# Patient Record
Sex: Male | Born: 1935 | Race: White | Hispanic: No | Marital: Married | Smoking: Former smoker
Health system: Southern US, Community
[De-identification: ages and names within clinical notes are randomized; demographics above are authoritative.]

## PROBLEM LIST (undated history)

## (undated) DIAGNOSIS — M1990 Unspecified osteoarthritis, unspecified site: Secondary | ICD-10-CM

## (undated) DIAGNOSIS — K319 Disease of stomach and duodenum, unspecified: Secondary | ICD-10-CM

## (undated) DIAGNOSIS — N529 Male erectile dysfunction, unspecified: Secondary | ICD-10-CM

## (undated) DIAGNOSIS — K219 Gastro-esophageal reflux disease without esophagitis: Secondary | ICD-10-CM

## (undated) DIAGNOSIS — D61818 Other pancytopenia: Secondary | ICD-10-CM

## (undated) DIAGNOSIS — K222 Esophageal obstruction: Secondary | ICD-10-CM

## (undated) DIAGNOSIS — E291 Testicular hypofunction: Secondary | ICD-10-CM

## (undated) DIAGNOSIS — Z8719 Personal history of other diseases of the digestive system: Secondary | ICD-10-CM

## (undated) DIAGNOSIS — E785 Hyperlipidemia, unspecified: Secondary | ICD-10-CM

## (undated) DIAGNOSIS — I10 Essential (primary) hypertension: Secondary | ICD-10-CM

## (undated) DIAGNOSIS — K4190 Unilateral femoral hernia, without obstruction or gangrene, not specified as recurrent: Secondary | ICD-10-CM

## (undated) DIAGNOSIS — N183 Chronic kidney disease, stage 3 unspecified: Secondary | ICD-10-CM

## (undated) DIAGNOSIS — I2510 Atherosclerotic heart disease of native coronary artery without angina pectoris: Secondary | ICD-10-CM

## (undated) DIAGNOSIS — E119 Type 2 diabetes mellitus without complications: Secondary | ICD-10-CM

## (undated) DIAGNOSIS — N400 Enlarged prostate without lower urinary tract symptoms: Secondary | ICD-10-CM

## (undated) DIAGNOSIS — I219 Acute myocardial infarction, unspecified: Secondary | ICD-10-CM

## (undated) DIAGNOSIS — H8309 Labyrinthitis, unspecified ear: Secondary | ICD-10-CM

## (undated) HISTORY — PX: LIPOMA EXCISION: SHX5283

## (undated) HISTORY — PX: CARDIAC SURGERY: SHX584

## (undated) HISTORY — PX: CARDIAC CATHETERIZATION: SHX172

## (undated) HISTORY — PX: COLONOSCOPY: SHX174

## (undated) HISTORY — PX: JOINT REPLACEMENT: SHX530

## (undated) HISTORY — PX: HERNIA REPAIR: SHX51

## (undated) HISTORY — DX: Gastro-esophageal reflux disease without esophagitis: K21.9

## (undated) HISTORY — PX: TONSILLECTOMY: SUR1361

## (undated) HISTORY — PX: CORONARY ANGIOPLASTY: SHX604

## (undated) HISTORY — PX: FLEXIBLE SIGMOIDOSCOPY: SHX1649

## (undated) HISTORY — PX: APPENDECTOMY: SHX54

## (undated) SURGERY — Surgical Case
Anesthesia: *Unknown

---

## 2005-03-23 ENCOUNTER — Ambulatory Visit: Payer: Self-pay | Admitting: Internal Medicine

## 2005-04-21 ENCOUNTER — Ambulatory Visit: Payer: Self-pay | Admitting: Internal Medicine

## 2005-05-30 ENCOUNTER — Ambulatory Visit: Payer: Self-pay | Admitting: Internal Medicine

## 2005-06-21 ENCOUNTER — Ambulatory Visit: Payer: Self-pay | Admitting: Internal Medicine

## 2005-08-01 ENCOUNTER — Ambulatory Visit: Payer: Self-pay | Admitting: Internal Medicine

## 2005-08-21 ENCOUNTER — Ambulatory Visit: Payer: Self-pay | Admitting: Internal Medicine

## 2005-09-26 ENCOUNTER — Ambulatory Visit: Payer: Self-pay | Admitting: Internal Medicine

## 2005-10-21 ENCOUNTER — Ambulatory Visit: Payer: Self-pay | Admitting: Internal Medicine

## 2005-11-28 ENCOUNTER — Ambulatory Visit: Payer: Self-pay | Admitting: Internal Medicine

## 2005-12-22 ENCOUNTER — Ambulatory Visit: Payer: Self-pay | Admitting: Internal Medicine

## 2007-07-20 ENCOUNTER — Ambulatory Visit: Payer: Self-pay | Admitting: Gastroenterology

## 2007-10-28 ENCOUNTER — Ambulatory Visit: Payer: Self-pay | Admitting: Internal Medicine

## 2010-01-11 ENCOUNTER — Ambulatory Visit: Payer: Self-pay | Admitting: General Practice

## 2010-01-27 ENCOUNTER — Inpatient Hospital Stay: Payer: Self-pay | Admitting: General Practice

## 2010-02-15 ENCOUNTER — Encounter: Payer: Self-pay | Admitting: General Practice

## 2010-02-19 ENCOUNTER — Encounter: Payer: Self-pay | Admitting: General Practice

## 2011-10-28 ENCOUNTER — Ambulatory Visit: Payer: Self-pay

## 2012-03-04 ENCOUNTER — Ambulatory Visit: Payer: Self-pay | Admitting: Family Medicine

## 2012-03-04 LAB — RAPID INFLUENZA A&B ANTIGENS

## 2012-09-27 ENCOUNTER — Ambulatory Visit: Payer: Self-pay | Admitting: Gastroenterology

## 2013-02-03 ENCOUNTER — Ambulatory Visit: Payer: Self-pay | Admitting: Family Medicine

## 2013-02-03 LAB — CLOSTRIDIUM DIFFICILE BY PCR

## 2013-02-03 LAB — OCCULT BLOOD X 1 CARD TO LAB, STOOL: Occult Blood, Feces: POSITIVE

## 2014-02-07 DIAGNOSIS — N529 Male erectile dysfunction, unspecified: Secondary | ICD-10-CM | POA: Insufficient documentation

## 2014-09-11 DIAGNOSIS — D61818 Other pancytopenia: Secondary | ICD-10-CM | POA: Insufficient documentation

## 2014-09-11 DIAGNOSIS — N401 Enlarged prostate with lower urinary tract symptoms: Secondary | ICD-10-CM | POA: Insufficient documentation

## 2014-09-11 DIAGNOSIS — E291 Testicular hypofunction: Secondary | ICD-10-CM | POA: Insufficient documentation

## 2014-09-11 DIAGNOSIS — E1159 Type 2 diabetes mellitus with other circulatory complications: Secondary | ICD-10-CM | POA: Insufficient documentation

## 2014-09-12 DIAGNOSIS — Z79899 Other long term (current) drug therapy: Secondary | ICD-10-CM | POA: Insufficient documentation

## 2014-12-17 HISTORY — PX: PROSTATE SURGERY: SHX751

## 2015-02-01 DIAGNOSIS — Z96652 Presence of left artificial knee joint: Secondary | ICD-10-CM | POA: Insufficient documentation

## 2015-03-18 DIAGNOSIS — N183 Chronic kidney disease, stage 3 unspecified: Secondary | ICD-10-CM | POA: Insufficient documentation

## 2015-03-19 ENCOUNTER — Other Ambulatory Visit: Payer: Self-pay

## 2015-03-19 ENCOUNTER — Ambulatory Visit
Admit: 2015-03-19 | Disposition: A | Discharge status: Home / Self Care | Payer: Self-pay | Attending: Gastroenterology | Admitting: Gastroenterology

## 2015-04-14 LAB — SURGICAL PATHOLOGY

## 2015-05-04 ENCOUNTER — Ambulatory Visit: Payer: Medicare Other | Attending: Internal Medicine | Admitting: Physical Therapy

## 2015-05-04 ENCOUNTER — Encounter: Payer: Self-pay | Admitting: Physical Therapy

## 2015-05-04 DIAGNOSIS — M25552 Pain in left hip: Secondary | ICD-10-CM

## 2015-05-04 DIAGNOSIS — M5442 Lumbago with sciatica, left side: Secondary | ICD-10-CM

## 2015-05-04 DIAGNOSIS — M6281 Muscle weakness (generalized): Secondary | ICD-10-CM | POA: Diagnosis not present

## 2015-05-04 DIAGNOSIS — M2560 Stiffness of unspecified joint, not elsewhere classified: Secondary | ICD-10-CM | POA: Diagnosis not present

## 2015-05-04 NOTE — Therapy (Signed)
[facility] [address] [place], [place], [address] Phone: [phone]   Fax:  [phone]  Physical Therapy Evaluation  Patient Details  Name: Trevor Deleon. MRN: [id number] Date of Birth: [birth year] Referring Provider:  Ezequiel Kayser, MD  Encounter Date: 05/04/2015      PT End of Session - 05/04/15 2012    Visit Number 1   Number of Visits 8   Date for PT Re-Evaluation 06/01/15   Authorization - Visit Number 1   Authorization - Number of Visits 10   PT Start Time 1055   PT Stop Time 1147   PT Time Calculation (min) 52 min   Activity Tolerance Patient tolerated treatment well;No increased pain   Behavior During Therapy [facility] for tasks assessed/performed      Past Medical History  Diagnosis Date  . GERD (gastroesophageal reflux disease)     Past Surgical History  Procedure Laterality Date  . Joint replacement      There were no vitals filed for this visit.  Visit Diagnosis:  Pain in joint, pelvic region and thigh, left  Left-sided low back pain with left-sided sciatica  Joint stiffness of spine  Muscle weakness of lower extremity      Subjective Assessment - 05/04/15 2008    Subjective Pt. reports no back pain at this time and c/o L mid-hamstring pain with walking/ standing tasks.  Pt. states he has a L hernia and is planning on surgery in September.     Limitations Sitting;Lifting;Standing;Walking   Patient Stated Goals Decrease L LE pain.     Currently in Pain? Yes   Pain Score 7    Pain Location Leg   Pain Orientation Left;Posterior;Mid   Pain Type Chronic pain   Effect of Pain on Daily Activities limits ability to complete household chores/ yardwork.    Multiple Pain Sites No            [facility] PT Assessment - 05/04/15 0001    Assessment   Medical Diagnosis Sciatica/ L LE pain   Onset Date/Surgical Date 04/03/15   Balance Screen   Has the patient fallen in the past 6 months No   Has the  patient had a decrease in activity level because of a fear of falling?  Yes   Is the patient reluctant to leave their home because of a fear of falling?  No   [place] residence           PT Education - 05/04/15 1403    Education provided Yes   Education Details HEP/ posture correction/ proper body mechanics with yard/household chores.   Person(s) Educated Patient;Spouse   Methods Explanation;Demonstration;Verbal cues;Handout   Comprehension Verbalized understanding;Returned demonstration             PT Long Term Goals - 05/05/15 0727    PT LONG TERM GOAL #1   Title Pt. I with HEP to increase B hamstring flexibility/ core stability to WNL to improve pain-free mobility.     Time 4   Period Weeks   Status New   PT LONG TERM GOAL #2   Title Pt. will report no tenderness over L mid-hamstring to improve pain-free mobility.     Time 4   Period Weeks   Status New   PT LONG TERM GOAL #3   Title Pt. will reports <2/10 L posterior leg pain at worst with yardwork/ dailiy tasks.     Time  4   Period Weeks   Status New   PT LONG TERM GOAL #4   Title Pt. will complete LEFS and score >50 out of 80 to improve pain-free mobility.    Time 4   Period Weeks   Status New           Plan - 05-08-2015 February 23, 2012    Clinical Impression Statement Pt. is an 81 y/o male with >1 month c/o L mid-hamstring pain with standing tasks/ yardwork.  Pt. reports 7/10 L hamstring pain currently at rest and no c/o back pain.  Pt. presents with 50% limitation in standing lumbar flexion and 25% limitation in extension/rotation.  Decrease R prox./distal hamstring flexibility (54 deg./32 deg.).  L prox./distal hamstring (53 deg./28 deg.).  Significant hypomobility in thoracic spine (T5-T12) but lumbar mobility normal.  No lumbar tenderness and good pelvic alignment.  (-) SI special tests.  Palpable tenderness at L mid-hamstring as compared to R.  Unable to reproduce any radicular  symptoms with lumbar ROM/ manual tx.  Pt. will benefit from lumbar/LE stretching program with core stability to improve pain-free moblity and return to yard work/ household chores.     Pt will benefit from skilled therapeutic intervention in order to improve on the following deficits Hypomobility;Decreased activity tolerance;Decreased strength;Pain;Difficulty walking;Decreased mobility;Decreased range of motion   Rehab Potential Good   PT Frequency 2x / week   PT Duration 4 weeks   PT Treatment/Interventions ADLs/Self Care Home Management;Neuromuscular re-education;Aquatic Therapy;Passive range of motion;Patient/family education;Cryotherapy;Gait training;Manual techniques;Therapeutic exercise;Moist Heat;Traction   PT Next Visit Plan Reassess HEP/ lumbar AROM and hamstring tenderness.    PT Home Exercise Plan see handouts.    Consulted and Agree with Plan of Care Patient;Family member/caregiver   Family Member Consulted wife          G-Codes - May 08, 2015 02-22-17    Functional Assessment Tool Used clinical impression/ pain/ muscle weakness   Functional Limitation Mobility: Walking and moving around   Mobility: Walking and Moving Around Current Status [phone]) At least 20 percent but less than 40 percent impaired, limited or restricted   Mobility: Walking and Moving Around Goal Status [phone]) At least 1 percent but less than 20 percent impaired, limited or restricted       Problem List There are no active problems to display for this patient.   Pura Spice, PT, DPT # [id number]   05/05/2015, 7:29 AM   [facility] [address] [place], [place], [address] Phone: [phone]   Fax:  [phone]

## 2015-05-07 ENCOUNTER — Ambulatory Visit: Payer: Medicare Other | Admitting: Physical Therapy

## 2015-05-07 DIAGNOSIS — M2560 Stiffness of unspecified joint, not elsewhere classified: Secondary | ICD-10-CM

## 2015-05-07 DIAGNOSIS — M6281 Muscle weakness (generalized): Secondary | ICD-10-CM

## 2015-05-07 DIAGNOSIS — M25552 Pain in left hip: Secondary | ICD-10-CM

## 2015-05-07 DIAGNOSIS — M5442 Lumbago with sciatica, left side: Secondary | ICD-10-CM | POA: Diagnosis not present

## 2015-05-08 NOTE — Therapy (Signed)
[facility] [address] [place], [place], [address] Phone: [phone]   Fax:  [phone]  Physical Therapy Treatment  Patient Details  Name: Trevor Deleon. MRN: [id number] Date of Birth: 06-24-1932 Referring Provider:  Ezequiel Kayser, MD  Encounter Date: 05/07/2015      PT End of Session - 05/08/15 0825    Visit Number 2   Number of Visits 8   Date for PT Re-Evaluation 06/01/15   Authorization - Visit Number 2   Authorization - Number of Visits 10   PT Start Time 1113   PT Stop Time 1158   PT Time Calculation (min) 45 min   Activity Tolerance Patient tolerated treatment well;No increased pain   Behavior During Therapy [facility] for tasks assessed/performed      Past Medical History  Diagnosis Date  . GERD (gastroesophageal reflux disease)     Past Surgical History  Procedure Laterality Date  . Joint replacement      There were no vitals filed for this visit.  Visit Diagnosis:  Pain in joint, pelvic region and thigh, left  Left-sided low back pain with left-sided sciatica  Joint stiffness of spine  Muscle weakness of lower extremity      Subjective Assessment - 05/08/15 0822    Subjective Pt. reports he is doing much better since PT initial evaluation.  Pt. reports no pain in back and minimal L mid-hamstring discomfort.    Limitations Sitting;Lifting;Standing;Walking   Patient Stated Goals Decrease L LE pain.     Currently in Pain? Yes   Pain Score 2    Pain Location Leg   Pain Orientation Left;Mid;Proximal       OBJECTIVE:  There.ex.: reviewed HEP/ hamstring stretches and modified technique.  Manual tx.: Supine hamstring/ piriformis stretches 3x with holds.  Deep STM to L prox. To distal hamstring (no tenderness noted).  Prone mid-low thoracic PA grade II-III mobs. 2x20 sec. Each.  STM to lumbar region (no radicular symptoms).     Pt response for medical necessity:  No increase c/o pain or tenderness  noted today.          PT Long Term Goals - 05/05/15 0727    PT LONG TERM GOAL #1   Title Pt. I with HEP to increase B hamstring flexibility/ core stability to WNL to improve pain-free mobility.     Time 4   Period Weeks   Status New   PT LONG TERM GOAL #2   Title Pt. will report no tenderness over L mid-hamstring to improve pain-free mobility.     Time 4   Period Weeks   Status New   PT LONG TERM GOAL #3   Title Pt. will reports <2/10 L posterior leg pain at worst with yardwork/ dailiy tasks.     Time 4   Period Weeks   Status New   PT LONG TERM GOAL #4   Title Pt. will complete LEFS and score >50 out of 80 to improve pain-free mobility.    Time 4   Period Weeks   Status New               Plan - 05/08/15 0826    Clinical Impression Statement Decrease L mid-hamstring tenderness with deep palpation.  Pt. reported no pain in back/hamstring muscle after tx. session.  Pt. reports good compliance with HEP and is sore but not hurting.     Pt will benefit from skilled therapeutic intervention in order  to improve on the following deficits Hypomobility;Decreased activity tolerance;Decreased strength;Pain;Difficulty walking;Decreased mobility;Decreased range of motion   Rehab Potential Good   PT Frequency 2x / week   PT Duration 4 weeks   PT Treatment/Interventions ADLs/Self Care Home Management;Neuromuscular re-education;Aquatic Therapy;Passive range of motion;Patient/family education;Cryotherapy;Gait training;Manual techniques;Therapeutic exercise;Moist Heat;Traction   PT Next Visit Plan Progress HEP/ add strengthening if no hamstring tenderness.    PT Home Exercise Plan see handouts.    Consulted and Agree with Plan of Care Patient;Family member/caregiver   Family Member Consulted wife        Problem List There are no active problems to display for this patient.   Pura Spice, PT, DPT # [id number]   05/08/2015, 8:31 AM  [facility]  [facility] [place] [address] [place], [place], [address] Phone: [phone]   Fax:  [phone]

## 2015-05-12 ENCOUNTER — Ambulatory Visit: Payer: Medicare Other | Admitting: Physical Therapy

## 2015-05-12 DIAGNOSIS — M5442 Lumbago with sciatica, left side: Secondary | ICD-10-CM

## 2015-05-12 DIAGNOSIS — M6281 Muscle weakness (generalized): Secondary | ICD-10-CM

## 2015-05-12 DIAGNOSIS — M25552 Pain in left hip: Secondary | ICD-10-CM

## 2015-05-12 DIAGNOSIS — M2560 Stiffness of unspecified joint, not elsewhere classified: Secondary | ICD-10-CM

## 2015-05-12 NOTE — Therapy (Signed)
[facility] [address] [place], [place], [address] Phone: [phone]   Fax:  [phone]  Physical Therapy Treatment  Patient Details  Name: Trevor Deleon. MRN: [id number] Date of Birth: 03-18-1932 Referring Provider:  Ezequiel Kayser, MD  Encounter Date: 05/12/2015      PT End of Session - 05/12/15 1332    Visit Number 3   Number of Visits 8   Date for PT Re-Evaluation 06/01/15   Authorization - Visit Number 3   Authorization - Number of Visits 10   PT Start Time 9735   PT Stop Time 1200   PT Time Calculation (min) 48 min   Activity Tolerance Patient tolerated treatment well;No increased pain   Behavior During Therapy [facility] for tasks assessed/performed      Past Medical History  Diagnosis Date  . GERD (gastroesophageal reflux disease)     Past Surgical History  Procedure Laterality Date  . Joint replacement      There were no vitals filed for this visit.  Visit Diagnosis:  Pain in joint, pelvic region and thigh, left  Left-sided low back pain with left-sided sciatica  Joint stiffness of spine  Muscle weakness of lower extremity      Subjective Assessment - 05/12/15 1112    Subjective (p) Pt. reports no pain in back/L hamstring at this time.  Pt. states he was hurting this morning but did exercises/ stretches and currently has no pain.     Limitations (p) Sitting;Lifting;Standing;Walking   Patient Stated Goals (p) Decrease L LE pain.     Currently in Pain? (p) No/denies        OBJECTIVE: There.ex.: Supine active hamstring stretches.  Scifit L6 10 min. B UE/LE (no pain/ no charge warm-up).  Supine quad/ hamstring sets 10x 10 sec. Holds.  Manual tx.: Supine hamstring/ piriformis stretches 3x with holds. Deep STM to L prox. To distal hamstring (no tenderness noted). Prone mid-low thoracic PA grade II-III mobs. 2x20 sec. Each. STM to lumbar region (no radicular symptoms).    Pt response for  medical necessity: No increase c/o pain or tenderness noted today.           PT Education - 05/12/15 1332    Education provided Yes   Education Details HEP (issued bridging/ core ex. in supine position)- pg. #2   Person(s) Educated Patient   Methods Explanation;Demonstration;Handout   Comprehension Verbalized understanding;Returned demonstration;Verbal cues required             PT Long Term Goals - 05/05/15 0727    PT LONG TERM GOAL #1   Title Pt. I with HEP to increase B hamstring flexibility/ core stability to WNL to improve pain-free mobility.     Time 4   Period Weeks   Status New   PT LONG TERM GOAL #2   Title Pt. will report no tenderness over L mid-hamstring to improve pain-free mobility.     Time 4   Period Weeks   Status New   PT LONG TERM GOAL #3   Title Pt. will reports <2/10 L posterior leg pain at worst with yardwork/ dailiy tasks.     Time 4   Period Weeks   Status New   PT LONG TERM GOAL #4   Title Pt. will complete LEFS and score >50 out of 80 to improve pain-free mobility.    Time 4   Period Weeks   Status New  Plan - 05/12/15 1333    Clinical Impression Statement No pain with palpation to lumbar/L mid-hamstring region.  Improvement noted in hamstring/piriformis flexibility and no increase c/o pain with addition of resisted hamstirng/core strengthening ex.     Pt will benefit from skilled therapeutic intervention in order to improve on the following deficits Hypomobility;Decreased activity tolerance;Decreased strength;Pain;Difficulty walking;Decreased mobility;Decreased range of motion   Rehab Potential Good   PT Frequency 2x / week   PT Duration 4 weeks   PT Treatment/Interventions ADLs/Self Care Home Management;Neuromuscular re-education;Aquatic Therapy;Passive range of motion;Patient/family education;Cryotherapy;Gait training;Manual techniques;Therapeutic exercise;Moist Heat;Traction   PT Next Visit Plan Reeassess HEP/ check  goals   PT Home Exercise Plan see handouts.    Consulted and Agree with Plan of Care Patient        Problem List There are no active problems to display for this patient.   Pura Spice, PT, DPT # [phone]   05/12/2015, 1:42 PM  [facility] [address] [place], [place], [address] Phone: [phone]   Fax:  [phone]

## 2015-05-14 ENCOUNTER — Ambulatory Visit: Payer: Medicare Other | Admitting: Physical Therapy

## 2015-05-14 DIAGNOSIS — M25552 Pain in left hip: Secondary | ICD-10-CM

## 2015-05-14 DIAGNOSIS — M5442 Lumbago with sciatica, left side: Secondary | ICD-10-CM | POA: Diagnosis not present

## 2015-05-14 DIAGNOSIS — M6281 Muscle weakness (generalized): Secondary | ICD-10-CM

## 2015-05-14 DIAGNOSIS — M2560 Stiffness of unspecified joint, not elsewhere classified: Secondary | ICD-10-CM

## 2015-05-15 NOTE — Therapy (Signed)
[facility] [address] [place], [place], [address] Phone: [phone]   Fax:  [phone]  Physical Therapy Treatment  Patient Details  Name: Trevor Deleon. MRN: [id number] Date of Birth: [birth year] Referring Provider:  Ezequiel Kayser, MD  Encounter Date: 05/14/2015      PT End of Session - 05/15/15 0852    Visit Number 4   Number of Visits 8   Date for PT Re-Evaluation 06/01/15   Authorization - Visit Number 4   Authorization - Number of Visits 10   PT Start Time 1113   PT Stop Time 1155   PT Time Calculation (min) 42 min   Activity Tolerance Patient tolerated treatment well;No increased pain   Behavior During Therapy [facility] for tasks assessed/performed      Past Medical History  Diagnosis Date  . GERD (gastroesophageal reflux disease)     Past Surgical History  Procedure Laterality Date  . Joint replacement      There were no vitals filed for this visit.  Visit Diagnosis:  Pain in joint, pelvic region and thigh, left  Left-sided low back pain with left-sided sciatica  Joint stiffness of spine  Muscle weakness of lower extremity      Subjective Assessment - 05/15/15 0847    Subjective Pt. sore/hurting in L mid-hamstring this morning but doing better at this time.  Pt. states he is stiff in the morning. Pt. states exercises/ stretching helps in the morning.    Limitations Sitting;Lifting;Standing;Walking   Patient Stated Goals Decrease L LE pain.     Currently in Pain? No/denies         OBJECTIVE: There.ex.: Supine active hamstring stretches. Scifit L6 10 min. B UE/LE (no pain/ no charge warm-up). Seated GTB knee flexion on edge of mat table 20x (moderate resistance).  Prone L knee flexion with manual resistance 10x (no pain).  Manual tx.: Supine hamstring/ piriformis stretches 3x with holds. Deep STM to L prox. To distal hamstring (no tenderness noted). Prone mid-low thoracic PA grade II-III mobs.  2x20 sec. Each. STM to lumbar region (no radicular symptoms).    Pt response for medical necessity: No increase c/o pain or tenderness noted today.          PT Long Term Goals - 05/05/15 0727    PT LONG TERM GOAL #1   Title Pt. I with HEP to increase B hamstring flexibility/ core stability to WNL to improve pain-free mobility.     Time 4   Period Weeks   Status New   PT LONG TERM GOAL #2   Title Pt. will report no tenderness over L mid-hamstring to improve pain-free mobility.     Time 4   Period Weeks   Status New   PT LONG TERM GOAL #3   Title Pt. will reports <2/10 L posterior leg pain at worst with yardwork/ dailiy tasks.     Time 4   Period Weeks   Status New   PT LONG TERM GOAL #4   Title Pt. will complete LEFS and score >50 out of 80 to improve pain-free mobility.    Time 4   Period Weeks   Status New               Plan - 05/15/15 6962    Clinical Impression Statement Moderate thoracic hypomobility and no tenderness with palpation along hamstring muscle fibers.  No pain with moderate resistance L hamstring ex.     Pt  will benefit from skilled therapeutic intervention in order to improve on the following deficits Hypomobility;Decreased activity tolerance;Decreased strength;Pain;Difficulty walking;Decreased mobility;Decreased range of motion   Rehab Potential Good   PT Frequency 2x / week   PT Duration 4 weeks   PT Treatment/Interventions ADLs/Self Care Home Management;Neuromuscular re-education;Aquatic Therapy;Passive range of motion;Patient/family education;Cryotherapy;Gait training;Manual techniques;Therapeutic exercise;Moist Heat;Traction   PT Next Visit Plan Reeassess HEP/ check goals   PT Home Exercise Plan see handouts.         Problem List There are no active problems to display for this patient.   Pura Spice, PT, DPT # [phone]   05/15/2015, 9:05 AM  [facility] [address] [place], [place], [address] Phone: [phone]   Fax:  [phone]

## 2015-05-19 ENCOUNTER — Ambulatory Visit: Payer: Medicare Other | Admitting: Physical Therapy

## 2015-05-19 DIAGNOSIS — M25552 Pain in left hip: Secondary | ICD-10-CM

## 2015-05-19 DIAGNOSIS — M5442 Lumbago with sciatica, left side: Secondary | ICD-10-CM

## 2015-05-19 DIAGNOSIS — M2560 Stiffness of unspecified joint, not elsewhere classified: Secondary | ICD-10-CM

## 2015-05-19 DIAGNOSIS — M6281 Muscle weakness (generalized): Secondary | ICD-10-CM

## 2015-05-19 NOTE — Therapy (Signed)
[facility] [address] [place], [place], [address] Phone: [phone]   Fax:  [phone]  Physical Therapy Treatment  Patient Details  Name: Trevor Deleon. MRN: [id number] Date of Birth: 1932/08/10 Referring Provider:  Ezequiel Kayser, MD  Encounter Date: 05/19/2015      PT End of Session - 05/20/15 0804    Visit Number 5   Number of Visits 8   Date for PT Re-Evaluation 06/01/15   Authorization - Visit Number 5   Authorization - Number of Visits 10   PT Start Time 1113   PT Stop Time 1200   PT Time Calculation (min) 47 min   Activity Tolerance Patient tolerated treatment well;No increased pain   Behavior During Therapy [facility] for tasks assessed/performed      Past Medical History  Diagnosis Date  . GERD (gastroesophageal reflux disease)     Past Surgical History  Procedure Laterality Date  . Joint replacement      There were no vitals filed for this visit.  Visit Diagnosis:  Left-sided low back pain with left-sided sciatica  Pain in joint, pelvic region and thigh, left  Joint stiffness of spine  Muscle weakness of lower extremity      Subjective Assessment - 05/20/15 0803    Subjective No pain or issues in hamstring reported at this time.  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  Pt. will occasionally have tightness in L hamstring but reports doing better overall.     Limitations Sitting;Lifting;Standing;Walking   Patient Stated Goals Decrease L LE pain.     Currently in Pain? No/denies       OBJECTIVE: There.ex.: Scifit L7 10 min. B UE/LE (no pain/ no charge/ warm-up). Seated L/R knee flexion isometrics (max resistance) on edge of mat table 10x with 10 sec holds (no pain). Prone L knee flexion with manual resistance 5x (no pain).  Reviewed current HEP/ addition of TrA ex.  Manual tx.: Supine hamstring/ piriformis stretches 3x with holds. Deep STM to L prox.  To distal hamstring (no tenderness noted). Prone mid-low thoracic PA grade II-III mobs. 2x20 sec. Each. STM to lumbar region (no radicular symptoms).    Pt response for medical necessity: No increase c/o pain or tenderness noted today.         PT Long Term Goals - 05/05/15 0727    PT LONG TERM GOAL #1   Title Pt. I with HEP to increase B hamstring flexibility/ core stability to WNL to improve pain-free mobility.     Time 4   Period Weeks   Status New   PT LONG TERM GOAL #2   Title Pt. will report no tenderness over L mid-hamstring to improve pain-free mobility.     Time 4   Period Weeks   Status New   PT LONG TERM GOAL #3   Title Pt. will reports <2/10 L posterior leg pain at worst with yardwork/ dailiy tasks.     Time 4   Period Weeks   Status New   PT LONG TERM GOAL #4   Title Pt. will complete LEFS and score >50 out of 80 to improve pain-free mobility.    Time 4   Period Weeks   Status New               Plan - 05/20/15 8938    Clinical Impression Statement No tenderness in L hamstring today  during STM and increase flexibility noted during LE/manual stretches.  Pt. presents with good quad/hip/hamstring strengthening during max. resistance isometrics.  Decrease PT tx. frequency to 1x/week and reassess goals next week    Pt will benefit from skilled therapeutic intervention in order to improve on the following deficits Hypomobility;Decreased activity tolerance;Decreased strength;Pain;Difficulty walking;Decreased mobility;Decreased range of motion   Rehab Potential Good   PT Frequency 2x / week   PT Duration 4 weeks   PT Treatment/Interventions ADLs/Self Care Home Management;Neuromuscular re-education;Aquatic Therapy;Passive range of motion;Patient/family education;Cryotherapy;Gait training;Manual techniques;Therapeutic exercise;Moist Heat;Traction   PT Next Visit Plan Reeassess HEP/ check goals   PT Home Exercise Plan see handouts.    Consulted and  Agree with Plan of Care Patient        Problem List There are no active problems to display for this patient.   Pura Spice, PT, DPT # [id number]   05/20/2015, 8:07 AM  [facility] [address] [place], [place], [address] Phone: [phone]   Fax:  [phone]

## 2015-05-21 ENCOUNTER — Encounter: Payer: Medicare Other | Admitting: Physical Therapy

## 2015-05-26 ENCOUNTER — Ambulatory Visit: Payer: Medicare Other | Attending: Internal Medicine | Admitting: Physical Therapy

## 2015-05-26 DIAGNOSIS — M2560 Stiffness of unspecified joint, not elsewhere classified: Secondary | ICD-10-CM | POA: Diagnosis present

## 2015-05-26 DIAGNOSIS — M5442 Lumbago with sciatica, left side: Secondary | ICD-10-CM | POA: Diagnosis present

## 2015-05-26 DIAGNOSIS — M25552 Pain in left hip: Secondary | ICD-10-CM

## 2015-05-26 DIAGNOSIS — M6281 Muscle weakness (generalized): Secondary | ICD-10-CM | POA: Diagnosis present

## 2015-05-27 NOTE — Therapy (Signed)
[facility] [address] [facility] [address] [place], [place], [address] Phone: [phone]   Fax:  [phone]  Physical Therapy Treatment  Patient Details  Name: Trevor Deleon. MRN: [id number] Date of Birth: September 12, 1932 Referring Provider:  Ezequiel Kayser, MD  Encounter Date: 05/26/2015      PT End of Session - 05/27/15 1059    Visit Number 6   Number of Visits 8   Date for PT Re-Evaluation 06/01/15   Authorization - Visit Number 6   Authorization - Number of Visits 10   PT Start Time 1110   PT Stop Time 1158   PT Time Calculation (min) 48 min   Activity Tolerance Patient tolerated treatment well;No increased pain   Behavior During Therapy [facility] for tasks assessed/performed      Past Medical History  Diagnosis Date  . GERD (gastroesophageal reflux disease)     Past Surgical History  Procedure Laterality Date  . Joint replacement      There were no vitals filed for this visit.  Visit Diagnosis:  Left-sided low back pain with left-sided sciatica  Pain in joint, pelvic region and thigh, left  Joint stiffness of spine  Muscle weakness of lower extremity      Subjective Assessment - 05/27/15 1057    Subjective Pt. states he has continued to do well with no c/o pain or L hamstring discomfort.  Pt. does report L hamstring tightness in morning but exercises have helped improve pain during the day.  Pt. remains active cleaning out shed and walking on a regular basis.  Pt. reports he is much better since starting PT.   Limitations Sitting;Lifting;Standing;Walking   Patient Stated Goals Decrease L LE pain.     Currently in Pain? No/denies        OBJECTIVE: There.ex.: Scifit L7 10 min. B UE/LE (no pain/ no charge/ warm-up). Seated L/R knee flexion isometrics (max resistance) on edge of mat table 10x with 10 sec holds (no pain). Prone L knee flexion with manual resistance 5x (no pain). Reviewed current HEP/ addition of TrA ex.  Manual  tx.: Supine hamstring/ piriformis stretches 3x with holds. Deep STM to L prox. To distal hamstring (no tenderness noted). Prone mid-low thoracic PA grade II-III mobs. 2x20 sec. Each. STM to lumbar region (no radicular symptoms).    Pt response for medical necessity: No increase c/o pain or tenderness noted today.  Discharge at this time.            PT Education - 05/27/15 1059    Education provided Yes   Education Details Reviewed HEP/ stretches.  Discussed importance of body mechanics/ walking program.     Person(s) Educated Patient   Methods Explanation;Demonstration   Comprehension Verbalized understanding;Returned demonstration;Verbal cues required             PT Long Term Goals - 05/27/15 1101    PT LONG TERM GOAL #1   Title Pt. I with HEP to increase B hamstring flexibility/ core stability to WNL to improve pain-free mobility.     Time 4   Period Weeks   Status Achieved   PT LONG TERM GOAL #2   Title Pt. will report no tenderness over L mid-hamstring to improve pain-free mobility.     Time 4   Period Weeks   Status Achieved   PT LONG TERM GOAL #3   Title Pt. will reports <2/10 L posterior leg pain at worst with yardwork/ dailiy tasks.  Time 4   Period Weeks   Status Achieved   PT LONG TERM GOAL #4   Title Pt. will complete LEFS and score >50 out of 80 to improve pain-free mobility.    Time 4   Period Weeks   Status Achieved               Plan - 2015-06-12 1059    Clinical Impression Statement Pt. has progressed well towards all PT goals.  No tenderness or pain in L hamstring at this time.  Unable to reproduce any pain symptoms during functional tasks.  Discharge from PT at this time with focus on independent ex. program.  Pt. instructed to call PT if any issues retrun.     Pt will benefit from skilled therapeutic intervention in order to improve on the following deficits Hypomobility;Decreased activity tolerance;Decreased  strength;Pain;Difficulty walking;Decreased mobility;Decreased range of motion   Rehab Potential Good   PT Frequency 2x / week   PT Duration 4 weeks   PT Treatment/Interventions ADLs/Self Care Home Management;Neuromuscular re-education;Aquatic Therapy;Passive range of motion;Patient/family education;Cryotherapy;Gait training;Manual techniques;Therapeutic exercise;Moist Heat;Traction   PT Next Visit Plan Discharge at this time.     PT Home Exercise Plan see handouts.    Consulted and Agree with Plan of Care Patient          G-Codes - 2015/06/12 1102    Functional Assessment Tool Used clinical impression/ pain/ muscle weakness   Functional Limitation Mobility: Walking and moving around   Mobility: Walking and Moving Around Current Status [phone]) At least 1 percent but less than 20 percent impaired, limited or restricted   Mobility: Walking and Moving Around Goal Status [phone]) At least 1 percent but less than 20 percent impaired, limited or restricted   Mobility: Walking and Moving Around Discharge Status [phone]) At least 1 percent but less than 20 percent impaired, limited or restricted      Problem List There are no active problems to display for this patient.   Pura Spice, PT, DPT # [phone]   Jun 12, 2015, 11:03 AM  [facility] [address] [place], [place], [address] Phone: [phone]   Fax:  [phone]

## 2015-08-02 ENCOUNTER — Ambulatory Visit
Admission: EM | Admit: 2015-08-02 | Discharge: 2015-08-02 | Disposition: A | Discharge status: Home / Self Care | Payer: Medicare Other | Attending: Family Medicine | Admitting: Family Medicine

## 2015-08-02 ENCOUNTER — Encounter: Payer: Self-pay | Admitting: *Deleted

## 2015-08-02 ENCOUNTER — Other Ambulatory Visit: Payer: Self-pay

## 2015-08-02 DIAGNOSIS — I252 Old myocardial infarction: Secondary | ICD-10-CM | POA: Diagnosis not present

## 2015-08-02 DIAGNOSIS — K2100 Gastro-esophageal reflux disease with esophagitis, without bleeding: Secondary | ICD-10-CM

## 2015-08-02 DIAGNOSIS — I10 Essential (primary) hypertension: Secondary | ICD-10-CM | POA: Diagnosis not present

## 2015-08-02 DIAGNOSIS — Z8679 Personal history of other diseases of the circulatory system: Secondary | ICD-10-CM

## 2015-08-02 DIAGNOSIS — R1013 Epigastric pain: Secondary | ICD-10-CM | POA: Diagnosis present

## 2015-08-02 HISTORY — DX: Essential (primary) hypertension: I10

## 2015-08-02 HISTORY — DX: Unilateral femoral hernia, without obstruction or gangrene, not specified as recurrent: K41.90

## 2015-08-02 MED ORDER — GI COCKTAIL ~~LOC~~
30.0000 mL | Freq: Once | ORAL | Status: AC
  Administered 2015-08-02: 30 mL via ORAL

## 2015-08-02 NOTE — ED Notes (Signed)
Pt states he had indigestion yesterday, woke up this morning with localized pain.  Pt has had an endoscopy in April for this issue, but the pain today is lasting longer than it has in the past.

## 2015-08-02 NOTE — ED Provider Notes (Signed)
CSN: [id number]     Arrival date & time 08/02/15  1224 History   First MD Initiated Contact with Patient 08/02/15 1252     Chief Complaint  Patient presents with  . Abdominal Pain   patient states he has a history of reflux and that he'll wake up with discomfort up into his neck. This morning he woke with epigastric pain has really gotten much better intensity has improved though somewhat scarred from a 6-2. States the difference though is that normally when he has his epigastric pain and swelling for about 2 hours this time/over 4 hours. He does have history of heart disease and had catheterization stent as having MI in 1999. States his cardiologist is wants him to have his stress test before he has hernia surgery and he hasn't had done yet. (Consider location/radiation/quality/duration/timing/severity/associated sxs/prior Treatment) Patient is a 81 y.o. male presenting with abdominal pain. The history is provided by the spouse and the patient.  Abdominal Pain Pain location:  Epigastric Pain quality: cramping and sharp   Pain radiates to:  Does not radiate Pain severity:  Moderate Onset quality:  Sudden Timing:  Constant Progression:  Improving Chronicity:  Recurrent Context: not awakening from sleep, not diet changes, not medication withdrawal, not sick contacts and not suspicious food intake   Relieved by:  Nothing Ineffective treatments:  OTC medications Associated symptoms: no anorexia, no chest pain, no cough, no nausea and no shortness of breath   Risk factors: aspirin, being elderly and multiple surgeries     Past Medical History  Diagnosis Date  . GERD (gastroesophageal reflux disease)   . Hypertension   . Hernia, femoral    Past Surgical History  Procedure Laterality Date  . Joint replacement    . Cardiac surgery     No family history on file. Social History  Substance Use Topics  . Smoking status: Former Research scientist (life sciences)  . Smokeless tobacco: None  . Alcohol Use: No     Review of Systems  Respiratory: Negative for cough and shortness of breath.   Cardiovascular: Negative for chest pain.  Gastrointestinal: Positive for abdominal pain. Negative for nausea and anorexia.  All other systems reviewed and are negative.  nurse's note reviewed patient stopped smoking in 1960s  Allergies  Review of patient's allergies indicates no known allergies.  Home Medications   Prior to Admission medications   Medication Sig Start Date End Date Taking? Authorizing Provider  Acetaminophen (TYLENOL) 325 MG CAPS Take by mouth.   Yes Historical Provider, MD  doxazosin (CARDURA) 4 MG tablet Take 4 mg by mouth daily.   Yes Historical Provider, MD  Fish Oil-Cholecalciferol (FISH OIL + D3 PO) Take by mouth.   Yes Historical Provider, MD  Multiple Vitamin (MULTIVITAMIN) capsule Take 1 capsule by mouth daily.   Yes Historical Provider, MD  omeprazole (PRILOSEC) 20 MG capsule Take 20 mg by mouth daily.   Yes Historical Provider, MD  Polyethylene Glycol 3350 (MIRALAX PO) Take by mouth.   Yes Historical Provider, MD  Psyllium (METAMUCIL PO) Take by mouth.   Yes Historical Provider, MD  ranitidine (ZANTAC) 150 MG tablet Take 150 mg by mouth 2 (two) times daily.   Yes Historical Provider, MD  simvastatin (ZOCOR) 40 MG tablet Take 40 mg by mouth daily.   Yes Historical Provider, MD  triamterene-hydrochlorothiazide (MAXZIDE-25) 37.5-25 MG per tablet Take 1 tablet by mouth daily.   Yes Historical Provider, MD   Meds Ordered and Administered this Visit   Medications  gi cocktail (Maalox,Lidocaine,Donnatal) (30 mLs Oral Given 08/02/15 1310)    BP 125/57 mmHg  Pulse 70  Temp(Src) 98.1 F (36.7 C) (Oral)  Ht 5\' 11"  (1.803 m)  Wt 182 lb (82.555 kg)  BMI 25.40 kg/m2  SpO2 100% No data found.   Physical Exam  Constitutional: He is oriented to person, place, and time. He appears well-developed.  Elderly white male  HENT:  Head: Normocephalic.  Eyes: Conjunctivae are normal.   Neck: Neck supple.  Cardiovascular: Normal rate and regular rhythm.   No murmur heard. Pulmonary/Chest: Effort normal and breath sounds normal. No respiratory distress.  Abdominal: Soft. Bowel sounds are normal. He exhibits no distension.  Musculoskeletal: Normal range of motion.  Neurological: He is alert and oriented to person, place, and time. No cranial nerve deficit.  Skin: Skin is warm and dry. No erythema.  Psychiatric: He has a normal mood and affect.  Vitals reviewed.   ED Course  Procedures (including critical care time)  Labs Review Labs Reviewed - No data to display  Imaging Review No results found.   Visual Acuity Review  Right Eye Distance:   Left Eye Distance:   Bilateral Distance:    Right Eye Near:   Left Eye Near:    Bilateral Near:      EKG was obtained showed sinus rhythm with some PACs   MDM   1. Reflux esophagitis   2. History of coronary artery disease     After GI cocktail was ministered patient felt much better states the discomfort has gone from 6 to about a 3 when he first got here to basically 01 at this time. Explained patient that if you want to really evaluate his heart and we we can do that is with serial cardiac enzymes and we did send over to the emergency room. He does not want to do that. He states that he sure size heart and that he feels so much better that he wants to go home. Explained to him if he starts having chest discomfort abdominal discomfort with epigastric discomfort returns he needs to go to the ED of his choice otherwise follow-up with his PCP.  Patient was explained once again that this evaluation does not with his heart and that we'll contact me for the blood work or tests since she's sees a gastroenterologist and had endoscopy recently for his reflexes esophagitis   Frederich Cha, MD 08/02/15 1419

## 2015-08-02 NOTE — Discharge Instructions (Signed)
Food Choices for Gastroesophageal Reflux Disease When you have gastroesophageal reflux disease (GERD), the foods you eat and your eating habits are very important. Choosing the right foods can help ease your discomfort.  WHAT GUIDELINES DO I NEED TO FOLLOW?   Choose fruits, vegetables, whole grains, and low-fat dairy products.   Choose low-fat meat, fish, and poultry.  Limit fats such as oils, salad dressings, butter, nuts, and avocado.   Keep a food diary. This helps you identify foods that cause symptoms.   Avoid foods that cause symptoms. These may be different for everyone.   Eat small meals often instead of 3 large meals a day.   Eat your meals slowly, in a place where you are relaxed.   Limit fried foods.   Cook foods using methods other than frying.   Avoid drinking alcohol.   Avoid drinking large amounts of liquids with your meals.   Avoid bending over or lying down until 2-3 hours after eating.  WHAT FOODS ARE NOT RECOMMENDED?  These are some foods and drinks that may make your symptoms worse: Vegetables Tomatoes. Tomato juice. Tomato and spaghetti sauce. Chili peppers. Onion and garlic. Horseradish. Fruits Oranges, grapefruit, and lemon (fruit and juice). Meats High-fat meats, fish, and poultry. This includes hot dogs, ribs, ham, sausage, salami, and bacon. Dairy Whole milk and chocolate milk. Sour cream. Cream. Butter. Ice cream. Cream cheese.  Drinks Coffee and tea. Bubbly (carbonated) drinks or energy drinks. Condiments Hot sauce. Barbecue sauce.  Sweets/Desserts Chocolate and cocoa. Donuts. Peppermint and spearmint. Fats and Oils High-fat foods. This includes [place] fries and potato chips. Other Vinegar. Strong spices. This includes black pepper, white pepper, red pepper, cayenne, curry powder, cloves, ginger, and chili powder. The items listed above may not be a complete list of foods and drinks to avoid. Contact your dietitian for more  information. Document Released: 05/08/2012 Document Revised: 11/12/2013 Document Reviewed: 09/11/2013 [facility] Patient Information 2015 [place], [place]. This information is not intended to replace advice given to you by your health care provider. Make sure you discuss any questions you have with your health care provider.  Heartburn Heartburn is a painful, burning feeling in the chest. It may feel worse when you lie down or bend over. Heartburn is caused by stomach acid moving into the tube that carries food from the mouth to the stomach (esophagus). HOME CARE  Take all medicine as told by your doctor.  Raise the head of your bed with blocks only as told by your doctor.  Do not exercise right after eating.  Avoid eating 2 or 3 hours before bed. Do not lie down right after eating.  Eat small meals throughout the day instead of 3 large meals.  Stop smoking if you smoke.  Keep up a healthy weight.  Avoid foods that give you heartburn. Foods you may want to avoid include:  Peppers.  Chocolate.  High-fat foods, including fried foods.  Spicy foods.  Garlic and onions.  Citrus fruits, including oranges, grapefruit, lemons, and limes.  Food containing tomatoes or tomato products.  Mint.  Bubbly (carbonated) drinks and drinks with caffeine.  Vinegar. GET HELP RIGHT AWAY IF:  You have bad chest pain that goes down your arm or into your jaw or neck.  You feel sweaty, dizzy, or lightheaded.  You have trouble breathing.  You throw up (vomit) blood.  You have trouble or pain when swallowing.  You have bloody or black poop (stool).  You have heartburn more than 3  times a week, for more than 2 weeks. MAKE SURE YOU:  Understand these instructions.  Will watch your condition.  Will get help right away if you are not doing well or get worse. Document Released: 07/20/2011 Document Revised: 01/30/2012 Document Reviewed: 07/20/2011 [facility] Patient Information 2015  [place], [place]. This information is not intended to replace advice given to you by your health care provider. Make sure you discuss any questions you have with your health care provider.

## 2015-08-04 ENCOUNTER — Encounter: Payer: Self-pay | Admitting: Emergency Medicine

## 2015-08-04 ENCOUNTER — Emergency Department
Admission: EM | Admit: 2015-08-04 | Discharge: 2015-08-04 | Disposition: A | Discharge status: Home / Self Care | Payer: Medicare Other | Attending: Emergency Medicine | Admitting: Emergency Medicine

## 2015-08-04 ENCOUNTER — Emergency Department: Payer: Medicare Other

## 2015-08-04 DIAGNOSIS — I10 Essential (primary) hypertension: Secondary | ICD-10-CM | POA: Diagnosis not present

## 2015-08-04 DIAGNOSIS — Z87891 Personal history of nicotine dependence: Secondary | ICD-10-CM | POA: Diagnosis not present

## 2015-08-04 DIAGNOSIS — K8020 Calculus of gallbladder without cholecystitis without obstruction: Secondary | ICD-10-CM | POA: Diagnosis not present

## 2015-08-04 DIAGNOSIS — Z79899 Other long term (current) drug therapy: Secondary | ICD-10-CM | POA: Insufficient documentation

## 2015-08-04 DIAGNOSIS — R52 Pain, unspecified: Secondary | ICD-10-CM | POA: Diagnosis present

## 2015-08-04 DIAGNOSIS — R17 Unspecified jaundice: Secondary | ICD-10-CM

## 2015-08-04 LAB — URINALYSIS COMPLETE WITH MICROSCOPIC (ARMC ONLY)
BILIRUBIN URINE: NEGATIVE
Bacteria, UA: NONE SEEN
GLUCOSE, UA: NEGATIVE mg/dL
KETONES UR: NEGATIVE mg/dL
Leukocytes, UA: NEGATIVE
NITRITE: NEGATIVE
PH: 5 (ref 5.0–8.0)
Protein, ur: 30 mg/dL — AB
Specific Gravity, Urine: 1.024 (ref 1.005–1.030)

## 2015-08-04 LAB — CBC
HCT: 41.6 % (ref 40.0–52.0)
Hemoglobin: 14.3 g/dL (ref 13.0–18.0)
MCH: 29.8 pg (ref 26.0–34.0)
MCHC: 34.4 g/dL (ref 32.0–36.0)
MCV: 86.9 fL (ref 80.0–100.0)
PLATELETS: 89 10*3/uL — AB (ref 150–440)
RBC: 4.79 MIL/uL (ref 4.40–5.90)
RDW: 13.5 % (ref 11.5–14.5)
WBC: 7.4 10*3/uL (ref 3.8–10.6)

## 2015-08-04 LAB — COMPREHENSIVE METABOLIC PANEL
ALT: 107 U/L — ABNORMAL HIGH (ref 17–63)
AST: 72 U/L — ABNORMAL HIGH (ref 15–41)
Albumin: 3.8 g/dL (ref 3.5–5.0)
Alkaline Phosphatase: 80 U/L (ref 38–126)
Anion gap: 13 (ref 5–15)
BUN: 36 mg/dL — ABNORMAL HIGH (ref 6–20)
CHLORIDE: 98 mmol/L — AB (ref 101–111)
CO2: 24 mmol/L (ref 22–32)
Calcium: 9 mg/dL (ref 8.9–10.3)
Creatinine, Ser: 1.65 mg/dL — ABNORMAL HIGH (ref 0.61–1.24)
GFR, EST AFRICAN AMERICAN: 43 mL/min — AB (ref >60)
GFR, EST NON AFRICAN AMERICAN: 37 mL/min — AB (ref >60)
Glucose, Bld: 152 mg/dL — ABNORMAL HIGH (ref 65–99)
POTASSIUM: 3.2 mmol/L — AB (ref 3.5–5.1)
Sodium: 135 mmol/L (ref 135–145)
Total Bilirubin: 5 mg/dL — ABNORMAL HIGH (ref 0.3–1.2)
Total Protein: 7 g/dL (ref 6.5–8.1)

## 2015-08-04 LAB — PROTIME-INR
INR: 1.1
Prothrombin Time: 14.4 seconds (ref 11.4–15.0)

## 2015-08-04 LAB — BILIRUBIN, DIRECT: Bilirubin, Direct: 1.9 mg/dL — ABNORMAL HIGH (ref 0.1–0.5)

## 2015-08-04 MED ORDER — SODIUM CHLORIDE 0.9 % IV SOLN
1000.0000 mL | Freq: Once | INTRAVENOUS | Status: AC
  Administered 2015-08-04: 1000 mL via INTRAVENOUS
  Filled 2015-08-04: qty 1000

## 2015-08-04 NOTE — ED Notes (Signed)
Pt rto ed via ems from home with c/o body aches, and ? Fever this am.  Pt states he had body aches and was shaking this morning so he took 2 tylenol and then had sweating. Pt alert and oriented at this time, skin hot to touch, no fever at this time.  Pt placed on cm and blood drawn and sent.

## 2015-08-04 NOTE — ED Provider Notes (Signed)
[facility] Emergency Department Provider Note  ____________________________________________  Time seen: On arrival  I have reviewed the triage vital signs and the nursing notes.   HISTORY  Chief Complaint Fever and Generalized Body Aches  HPI Trevor Deleon. is a 81 y.o. male who presents with complaints of myalgias, malaise and fatigue that started yesterday.2 days ago he went to urgent care to be evaluated for upper epigastric pain which resolved after a GI cocktail. The following day he had chills and what he describes as rigors. He did not check his temperature but is concerned he had a fever. No nausea/vomiting. No cough. No shortness of breath     Past Medical History  Diagnosis Date  . GERD (gastroesophageal reflux disease)   . Hypertension   . Hernia, femoral     There are no active problems to display for this patient.   Past Surgical History  Procedure Laterality Date  . Joint replacement    . Cardiac surgery      Current Outpatient Rx  Name  Route  Sig  Dispense  Refill  . ALPRAZolam (XANAX) 0.25 MG tablet   Oral   Take 1 tablet by mouth at bedtime as needed.      3   . doxazosin (CARDURA) 4 MG tablet   Oral   Take 4 mg by mouth daily.         . Fish Oil-Cholecalciferol (FISH OIL + D3 PO)   Oral   Take by mouth.         Marland Kitchen lisinopril (PRINIVIL,ZESTRIL) 40 MG tablet   Oral   Take 40 mg by mouth daily.         . Multiple Vitamin (MULTIVITAMIN) capsule   Oral   Take 1 capsule by mouth daily.         Marland Kitchen omeprazole (PRILOSEC) 20 MG capsule   Oral   Take 20 mg by mouth daily.         . Polyethylene Glycol 3350 (MIRALAX PO)   Oral   Take 1 Dose by mouth daily.          . Psyllium (METAMUCIL PO)   Oral   Take 1 Dose by mouth daily.          . ranitidine (ZANTAC) 150 MG tablet   Oral   Take 150 mg by mouth 2 (two) times daily.         . simvastatin (ZOCOR) 40 MG tablet   Oral   Take 40 mg by  mouth daily.         Marland Kitchen triamterene-hydrochlorothiazide (MAXZIDE-25) 37.5-25 MG per tablet   Oral   Take 1 tablet by mouth daily.         . Acetaminophen (TYLENOL) 325 MG CAPS   Oral   Take by mouth.           Allergies Review of patient's allergies indicates no known allergies.  History reviewed. No pertinent family history.  Social History Social History  Substance Use Topics  . Smoking status: Former Research scientist (life sciences)  . Smokeless tobacco: None  . Alcohol Use: No    Review of Systems  Constitutional: Positive for fever and chills Eyes: Negative for visual changes. ENT: Negative for sore throat Cardiovascular: Negative for chest pain. Respiratory: Negative for shortness of breath. Gastrointestinal: Negative for abdominal pain, vomiting and diarrhea. Genitourinary: Negative for dysuria. Musculoskeletal: Negative for back pain. Skin: Negative for rash. Neurological: Negative for headaches or focal weakness Psychiatric:  No anxiety    ____________________________________________   PHYSICAL EXAM:  VITAL SIGNS: ED Triage Vitals  Enc Vitals Group     BP 08/04/15 0830 140/78 mmHg     Pulse Rate 08/04/15 0830 72     Resp 08/04/15 0830 20     Temp 08/04/15 0830 98.3 F (36.8 C)     Temp Source 08/04/15 0830 Oral     SpO2 08/04/15 0830 97 %     Weight 08/04/15 0830 180 lb (81.647 kg)     Height 08/04/15 0830 5\' 11"  (1.803 m)     Head Cir --      Peak Flow --      Pain Score 08/04/15 0831 0     Pain Loc --      Pain Edu? --      Excl. in [place]? --      Constitutional: Alert and oriented. Well appearing and in no distress. Eyes: Conjunctivae are normal.  ENT   Head: Normocephalic and atraumatic.   Mouth/Throat: Mucous membranes are moist. Cardiovascular: Normal rate, regular rhythm. Normal and symmetric distal pulses are present in all extremities. No murmurs, rubs, or gallops. Respiratory: Normal respiratory effort without tachypnea nor retractions. Breath  sounds are clear and equal bilaterally.  Gastrointestinal: Soft and non-tender in all quadrants. No distention. There is no CVA tenderness. Genitourinary: deferred Musculoskeletal: Nontender with normal range of motion in all extremities. No lower extremity tenderness nor edema. Neurologic:  Normal speech and language. No gross focal neurologic deficits are appreciated. Skin:  Skin is warm, dry and intact. No rash noted. Psychiatric: Mood and affect are normal. Patient exhibits appropriate insight and judgment.  ____________________________________________    LABS (pertinent positives/negatives)  Labs Reviewed  CBC - Abnormal; Notable for the following:    Platelets 89 (*)    All other components within normal limits  COMPREHENSIVE METABOLIC PANEL - Abnormal; Notable for the following:    Potassium 3.2 (*)    Chloride 98 (*)    Glucose, Bld 152 (*)    BUN 36 (*)    Creatinine, Ser 1.65 (*)    AST 72 (*)    ALT 107 (*)    Total Bilirubin 5.0 (*)    GFR calc non Af Amer 37 (*)    GFR calc Af Amer 43 (*)    All other components within normal limits  URINALYSIS COMPLETEWITH MICROSCOPIC ([facility] ONLY) - Abnormal; Notable for the following:    Color, Urine AMBER (*)    APPearance CLEAR (*)    Hgb urine dipstick 1+ (*)    Protein, ur 30 (*)    Squamous Epithelial / LPF 0-5 (*)    All other components within normal limits  BILIRUBIN, DIRECT - Abnormal; Notable for the following:    Bilirubin, Direct 1.9 (*)    All other components within normal limits  PROTIME-INR    ____________________________________________   EKG  None  ____________________________________________    RADIOLOGY I have personally reviewed any xrays that were ordered on this patient: Ultrasound right upper quadrant unremarkable  ____________________________________________   PROCEDURES  Procedure(s) performed: none  Critical Care performed:  none  ____________________________________________   INITIAL IMPRESSION / ASSESSMENT AND PLAN / ED COURSE  Pertinent labs & imaging results that were available during my care of the patient were reviewed by me and considered in my medical decision making (see chart for details).  Patient with elevated T bili, mildly elevated LFTs, normal ultrasound. He does not drink he does  not take excess Tylenol . Will discuss with GI  ----------------------------------------- 2:01 PM on 08/04/2015 -----------------------------------------  Discussed lab results and imaging results and patient's history of present illness with Dr. Rayann Heman, he suspects the patient passed a bile duct stone. He recommends repeat liver enzymes in 24 hours.   I discussed with patient's primary care provider Dr. Dorthula Perfect who will see the patient in one day and repeat the patient's blood tests. I had extensive discussions with the patient regarding return precautions. The patient and family agree with the plan  ____________________________________________   FINAL CLINICAL IMPRESSION(S) / ED DIAGNOSES  Final diagnoses:  Total bilirubin, elevated     Lavonia Drafts, MD 08/04/15 1440

## 2015-08-04 NOTE — Discharge Instructions (Signed)

## 2015-08-29 ENCOUNTER — Emergency Department: Payer: Medicare Other

## 2015-08-29 ENCOUNTER — Inpatient Hospital Stay
Admission: EM | Admit: 2015-08-29 | Discharge: 2015-09-04 | DRG: 417 | Disposition: A | Discharge status: Home / Self Care | Payer: Medicare Other | Attending: Internal Medicine | Admitting: Internal Medicine

## 2015-08-29 ENCOUNTER — Encounter: Payer: Self-pay | Admitting: Emergency Medicine

## 2015-08-29 DIAGNOSIS — F419 Anxiety disorder, unspecified: Secondary | ICD-10-CM | POA: Diagnosis present

## 2015-08-29 DIAGNOSIS — D696 Thrombocytopenia, unspecified: Secondary | ICD-10-CM | POA: Diagnosis present

## 2015-08-29 DIAGNOSIS — N400 Enlarged prostate without lower urinary tract symptoms: Secondary | ICD-10-CM | POA: Diagnosis present

## 2015-08-29 DIAGNOSIS — Z6825 Body mass index (BMI) 25.0-25.9, adult: Secondary | ICD-10-CM | POA: Diagnosis not present

## 2015-08-29 DIAGNOSIS — Z955 Presence of coronary angioplasty implant and graft: Secondary | ICD-10-CM | POA: Diagnosis not present

## 2015-08-29 DIAGNOSIS — K219 Gastro-esophageal reflux disease without esophagitis: Secondary | ICD-10-CM | POA: Diagnosis present

## 2015-08-29 DIAGNOSIS — K8020 Calculus of gallbladder without cholecystitis without obstruction: Secondary | ICD-10-CM

## 2015-08-29 DIAGNOSIS — R7989 Other specified abnormal findings of blood chemistry: Secondary | ICD-10-CM | POA: Diagnosis present

## 2015-08-29 DIAGNOSIS — I25119 Atherosclerotic heart disease of native coronary artery with unspecified angina pectoris: Secondary | ICD-10-CM | POA: Diagnosis present

## 2015-08-29 DIAGNOSIS — I252 Old myocardial infarction: Secondary | ICD-10-CM | POA: Diagnosis not present

## 2015-08-29 DIAGNOSIS — E876 Hypokalemia: Secondary | ICD-10-CM | POA: Diagnosis present

## 2015-08-29 DIAGNOSIS — E785 Hyperlipidemia, unspecified: Secondary | ICD-10-CM | POA: Diagnosis present

## 2015-08-29 DIAGNOSIS — E440 Moderate protein-calorie malnutrition: Secondary | ICD-10-CM | POA: Diagnosis present

## 2015-08-29 DIAGNOSIS — Z79899 Other long term (current) drug therapy: Secondary | ICD-10-CM | POA: Diagnosis not present

## 2015-08-29 DIAGNOSIS — Z7982 Long term (current) use of aspirin: Secondary | ICD-10-CM

## 2015-08-29 DIAGNOSIS — Z87891 Personal history of nicotine dependence: Secondary | ICD-10-CM

## 2015-08-29 DIAGNOSIS — K851 Biliary acute pancreatitis without necrosis or infection: Secondary | ICD-10-CM | POA: Diagnosis present

## 2015-08-29 DIAGNOSIS — R002 Palpitations: Secondary | ICD-10-CM | POA: Diagnosis not present

## 2015-08-29 DIAGNOSIS — E871 Hypo-osmolality and hyponatremia: Secondary | ICD-10-CM | POA: Diagnosis present

## 2015-08-29 DIAGNOSIS — E1122 Type 2 diabetes mellitus with diabetic chronic kidney disease: Secondary | ICD-10-CM | POA: Diagnosis present

## 2015-08-29 DIAGNOSIS — N183 Chronic kidney disease, stage 3 (moderate): Secondary | ICD-10-CM | POA: Diagnosis present

## 2015-08-29 DIAGNOSIS — K8064 Calculus of gallbladder and bile duct with chronic cholecystitis without obstruction: Principal | ICD-10-CM | POA: Diagnosis present

## 2015-08-29 DIAGNOSIS — K859 Acute pancreatitis without necrosis or infection, unspecified: Secondary | ICD-10-CM | POA: Diagnosis present

## 2015-08-29 DIAGNOSIS — E860 Dehydration: Secondary | ICD-10-CM | POA: Diagnosis present

## 2015-08-29 DIAGNOSIS — I129 Hypertensive chronic kidney disease with stage 1 through stage 4 chronic kidney disease, or unspecified chronic kidney disease: Secondary | ICD-10-CM | POA: Diagnosis present

## 2015-08-29 HISTORY — DX: Hyperlipidemia, unspecified: E78.5

## 2015-08-29 HISTORY — DX: Chronic kidney disease, stage 3 (moderate): N18.3

## 2015-08-29 HISTORY — DX: Chronic kidney disease, stage 3 unspecified: N18.30

## 2015-08-29 HISTORY — DX: Benign prostatic hyperplasia without lower urinary tract symptoms: N40.0

## 2015-08-29 LAB — CBC WITH DIFFERENTIAL/PLATELET
BASOS ABS: 0 10*3/uL (ref 0–0.1)
EOS ABS: 0.1 10*3/uL (ref 0–0.7)
HEMATOCRIT: 41.1 % (ref 40.0–52.0)
Hemoglobin: 14.4 g/dL (ref 13.0–18.0)
Lymphocytes Relative: 21 %
Lymphs Abs: 1.4 10*3/uL (ref 1.0–3.6)
MCH: 30.4 pg (ref 26.0–34.0)
MCHC: 35 g/dL (ref 32.0–36.0)
MCV: 86.9 fL (ref 80.0–100.0)
MONO ABS: 0.5 10*3/uL (ref 0.2–1.0)
Monocytes Relative: 7 %
NEUTROS ABS: 4.6 10*3/uL (ref 1.4–6.5)
Neutrophils Relative %: 69 %
PLATELETS: 90 10*3/uL — AB (ref 150–440)
RBC: 4.73 MIL/uL (ref 4.40–5.90)
RDW: 13.8 % (ref 11.5–14.5)
WBC: 6.7 10*3/uL (ref 3.8–10.6)

## 2015-08-29 LAB — COMPREHENSIVE METABOLIC PANEL
ALT: 68 U/L — ABNORMAL HIGH (ref 17–63)
AST: 149 U/L — ABNORMAL HIGH (ref 15–41)
Albumin: 4.5 g/dL (ref 3.5–5.0)
Alkaline Phosphatase: 93 U/L (ref 38–126)
Anion gap: 9 (ref 5–15)
BUN: 30 mg/dL — ABNORMAL HIGH (ref 6–20)
CO2: 29 mmol/L (ref 22–32)
Calcium: 9.4 mg/dL (ref 8.9–10.3)
Chloride: 101 mmol/L (ref 101–111)
Creatinine, Ser: 1.37 mg/dL — ABNORMAL HIGH (ref 0.61–1.24)
GFR calc Af Amer: 54 mL/min — ABNORMAL LOW (ref >60)
GFR calc non Af Amer: 47 mL/min — ABNORMAL LOW (ref >60)
Glucose, Bld: 175 mg/dL — ABNORMAL HIGH (ref 65–99)
Potassium: 3.4 mmol/L — ABNORMAL LOW (ref 3.5–5.1)
Sodium: 139 mmol/L (ref 135–145)
Total Bilirubin: 2.1 mg/dL — ABNORMAL HIGH (ref 0.3–1.2)
Total Protein: 7.2 g/dL (ref 6.5–8.1)

## 2015-08-29 LAB — TROPONIN I: Troponin I: <0.03 ng/mL (ref <0.031)

## 2015-08-29 LAB — LIPASE, BLOOD: Lipase: 4978 U/L — ABNORMAL HIGH (ref 22–51)

## 2015-08-29 MED ORDER — ACETAMINOPHEN 650 MG RE SUPP: 650.0000 mg | Freq: Four times a day (QID) | RECTAL | Status: DC | PRN

## 2015-08-29 MED ORDER — PANTOPRAZOLE SODIUM 40 MG PO TBEC
40.0000 mg | DELAYED_RELEASE_TABLET | Freq: Every day | ORAL | Status: DC
  Administered 2015-08-29 – 2015-09-04 (×6): 40 mg via ORAL
  Filled 2015-08-29 (×6): qty 1

## 2015-08-29 MED ORDER — DOXAZOSIN MESYLATE 4 MG PO TABS
4.0000 mg | ORAL_TABLET | Freq: Every day | ORAL | Status: DC
  Administered 2015-08-29 – 2015-09-03 (×6): 4 mg via ORAL
  Filled 2015-08-29 (×6): qty 1

## 2015-08-29 MED ORDER — ACETAMINOPHEN 325 MG PO TABS: 650.0000 mg | ORAL_TABLET | Freq: Four times a day (QID) | ORAL | Status: DC | PRN

## 2015-08-29 MED ORDER — FAMOTIDINE 20 MG PO TABS
20.0000 mg | ORAL_TABLET | Freq: Two times a day (BID) | ORAL | Status: DC
  Administered 2015-08-29 – 2015-09-04 (×12): 20 mg via ORAL
  Filled 2015-08-29 (×12): qty 1

## 2015-08-29 MED ORDER — ALPRAZOLAM 0.25 MG PO TABS
0.2500 mg | ORAL_TABLET | Freq: Every evening | ORAL | Status: DC | PRN
  Administered 2015-09-01: 0.25 mg via ORAL
  Filled 2015-08-29: qty 1

## 2015-08-29 MED ORDER — POTASSIUM CHLORIDE ER 10 MEQ PO TBCR
30.0000 meq | EXTENDED_RELEASE_TABLET | Freq: Every day | ORAL | Status: DC
  Administered 2015-08-29 – 2015-09-04 (×6): 30 meq via ORAL
  Filled 2015-08-29 (×13): qty 3

## 2015-08-29 MED ORDER — ENOXAPARIN SODIUM 40 MG/0.4ML ~~LOC~~ SOLN
40.0000 mg | SUBCUTANEOUS | Status: DC
  Administered 2015-08-29 – 2015-08-31 (×3): 40 mg via SUBCUTANEOUS
  Filled 2015-08-29 (×3): qty 0.4

## 2015-08-29 MED ORDER — SODIUM CHLORIDE 0.9 % IV BOLUS (SEPSIS)
1000.0000 mL | Freq: Once | INTRAVENOUS | Status: AC
  Administered 2015-08-29: 1000 mL via INTRAVENOUS

## 2015-08-29 MED ORDER — HYDROCODONE-ACETAMINOPHEN 5-325 MG PO TABS
1.0000 | ORAL_TABLET | ORAL | Status: DC | PRN
  Administered 2015-09-02 – 2015-09-04 (×6): 2 via ORAL
  Filled 2015-08-29 (×5): qty 2

## 2015-08-29 MED ORDER — ASPIRIN EC 81 MG PO TBEC
81.0000 mg | DELAYED_RELEASE_TABLET | Freq: Every day | ORAL | Status: DC
  Filled 2015-08-29 (×3): qty 1

## 2015-08-29 MED ORDER — GI COCKTAIL ~~LOC~~
30.0000 mL | Freq: Once | ORAL | Status: AC
  Administered 2015-08-29: 30 mL via ORAL
  Filled 2015-08-29: qty 30

## 2015-08-29 MED ORDER — MORPHINE SULFATE (PF) 4 MG/ML IV SOLN
4.0000 mg | INTRAVENOUS | Status: DC | PRN
  Administered 2015-09-02 – 2015-09-03 (×4): 4 mg via INTRAVENOUS
  Filled 2015-08-29 (×4): qty 1

## 2015-08-29 MED ORDER — ONDANSETRON HCL 4 MG/2ML IJ SOLN
4.0000 mg | Freq: Four times a day (QID) | INTRAMUSCULAR | Status: DC | PRN
  Administered 2015-09-02: 4 mg via INTRAVENOUS

## 2015-08-29 MED ORDER — SIMVASTATIN 40 MG PO TABS
40.0000 mg | ORAL_TABLET | Freq: Every day | ORAL | Status: DC
  Administered 2015-08-29 – 2015-09-03 (×6): 40 mg via ORAL
  Filled 2015-08-29 (×6): qty 1

## 2015-08-29 MED ORDER — ONDANSETRON HCL 4 MG PO TABS: 4.0000 mg | ORAL_TABLET | Freq: Four times a day (QID) | ORAL | Status: DC | PRN

## 2015-08-29 MED ORDER — ALBUTEROL SULFATE (2.5 MG/3ML) 0.083% IN NEBU: 2.5000 mg | INHALATION_SOLUTION | RESPIRATORY_TRACT | Status: DC | PRN

## 2015-08-29 MED ORDER — POTASSIUM CHLORIDE IN NACL 20-0.9 MEQ/L-% IV SOLN
INTRAVENOUS | Status: DC
  Administered 2015-08-29 – 2015-08-30 (×2): 1000 mL via INTRAVENOUS
  Administered 2015-08-30 – 2015-08-31 (×2): via INTRAVENOUS
  Filled 2015-08-29 (×6): qty 1000

## 2015-08-29 NOTE — ED Notes (Signed)
Admitting MD in room at this time 

## 2015-08-29 NOTE — H&P (Signed)
[facility] [place] NAME: Trevor Deleon    MR#:  [id number]  DATE OF BIRTH:  Apr 08, 1932  DATE OF ADMISSION:  08/29/2015  PRIMARY CARE PHYSICIAN: Ezequiel Kayser, MD   REQUESTING/REFERRING PHYSICIAN: Orbie Pyo, MD  CHIEF COMPLAINT:   Chief Complaint  Patient presents with  . Chest Pain    Pt. presents to ed with family for chest pain that started around 10pm last night.   Epigastric pain since last night. HISTORY OF PRESENT ILLNESS:  Trevor Deleon  is a 81 y.o. male with a known history of gallbladder stone, CAD, CKD, hypertension and hyperlipidemia. The patient started to have abdominal pain in epigastric area since last night, which is intermittent, pressure-like, 5 out of 10 without radiation, not associated with the nausea, vomiting or diarrhea. He denies any fever or chills, no melena or bloody stool, not jaundice. The patient has a history of gallbladder stone, had abdominal pain with elevated bilirubin for the past few months. He was scheduled to have a gallbladder surgery in 2 weeks. His lipase is elevated at 4978. Dr. Tollie Pizza, general surgeon, suggested admitting patient for medical treatment, then may need surgery early this coming week.  PAST MEDICAL HISTORY:   Past Medical History  Diagnosis Date  . GERD (gastroesophageal reflux disease)   . Hypertension   . Hernia, femoral   . CKD (chronic kidney disease) stage 3, GFR 30-59 ml/min   . BPH (benign prostatic hyperplasia)   . Hyperlipidemia     PAST SURGICAL HISTORY:   Past Surgical History  Procedure Laterality Date  . Joint replacement    . Cardiac surgery    . Tonsillectomy    . Appendectomy      SOCIAL HISTORY:   Social History  Substance Use Topics  . Smoking status: Former Research scientist (life sciences)  . Smokeless tobacco: Not on file  . Alcohol Use: No    FAMILY HISTORY:   Family History  Problem Relation Age of Onset  . Hypertension Mother   . Stroke  Mother   . Prostate cancer Father   . Heart attack Father     DRUG ALLERGIES:  No Known Allergies  REVIEW OF SYSTEMS:  CONSTITUTIONAL: No fever, fatigue or weakness.  EYES: No blurred or double vision.  EARS, NOSE, AND THROAT: No tinnitus or ear pain.  RESPIRATORY: No cough, shortness of breath, wheezing or hemoptysis.  CARDIOVASCULAR: No chest pain, orthopnea, edema.  GASTROINTESTINAL: No nausea, vomiting, diarrhea but has abdominal pain.  GENITOURINARY: No dysuria, hematuria.  ENDOCRINE: No polyuria, nocturia,  HEMATOLOGY: No anemia, easy bruising or bleeding SKIN: No rash or lesion. MUSCULOSKELETAL: No joint pain or arthritis.   NEUROLOGIC: No tingling, numbness, weakness.  PSYCHIATRY: No anxiety or depression.   MEDICATIONS AT HOME:   Prior to Admission medications   Medication Sig Start Date End Date Taking? Authorizing Provider  ALPRAZolam Duanne Moron) 0.25 MG tablet Take 1 tablet by mouth at bedtime as needed. 06/25/15   Historical Provider, MD  doxazosin (CARDURA) 4 MG tablet Take 4 mg by mouth daily.    Historical Provider, MD  lisinopril (PRINIVIL,ZESTRIL) 40 MG tablet Take 40 mg by mouth daily.    Historical Provider, MD  Multiple Vitamin (MULTIVITAMIN) capsule Take 1 capsule by mouth daily.    Historical Provider, MD  omeprazole (PRILOSEC) 20 MG capsule Take 20 mg by mouth daily.    Historical Provider, MD  Polyethylene Glycol 3350 (MIRALAX PO) Take 1 Dose by mouth daily.  Historical Provider, MD  Psyllium (METAMUCIL PO) Take 1 Dose by mouth daily.     Historical Provider, MD  ranitidine (ZANTAC) 150 MG tablet Take 150 mg by mouth 2 (two) times daily.    Historical Provider, MD  simvastatin (ZOCOR) 40 MG tablet Take 40 mg by mouth daily.    Historical Provider, MD  triamterene-hydrochlorothiazide (MAXZIDE-25) 37.5-25 MG per tablet Take 1 tablet by mouth daily.    Historical Provider, MD      VITAL SIGNS:  Blood pressure 113/68, pulse 60, temperature 97.5 F (36.4 C),  temperature source Oral, resp. rate 15, height 5\' 11"  (1.803 m), weight 78.926 kg (174 lb), SpO2 97 %.  PHYSICAL EXAMINATION:  GENERAL:  81 y.o.-year-old patient lying in the bed with no acute distress.  EYES: Pupils equal, round, reactive to light and accommodation. No scleral icterus. Extraocular muscles intact.  HEENT: Head atraumatic, normocephalic. Oropharynx and nasopharynx clear. Moist oral mucosa. NECK:  Supple, no jugular venous distention. No thyroid enlargement, no tenderness.  LUNGS: Normal breath sounds bilaterally, no wheezing, rales,rhonchi or crepitation. No use of accessory muscles of respiration.  CARDIOVASCULAR: S1, S2 normal. No murmurs, rubs, or gallops.  ABDOMEN: Soft, mild tenderness in epigastric area, nondistended. Bowel sounds present. No organomegaly or mass.  EXTREMITIES: No pedal edema, cyanosis, or clubbing.  NEUROLOGIC: Cranial nerves II through XII are intact. Muscle strength 5/5 in all extremities. Sensation intact. Gait not checked.  PSYCHIATRIC: The patient is alert and oriented x 3.  SKIN: No obvious rash, lesion, or ulcer. No jaundice.  LABORATORY PANEL:   CBC  Recent Labs Lab 08/29/15 0549  WBC 6.7  HGB 14.4  HCT 41.1  PLT 90*   ------------------------------------------------------------------------------------------------------------------  Chemistries   Recent Labs Lab 08/29/15 0549  NA 139  K 3.4*  CL 101  CO2 29  GLUCOSE 175*  BUN 30*  CREATININE 1.37*  CALCIUM 9.4  AST 149*  ALT 68*  ALKPHOS 93  BILITOT 2.1*   ------------------------------------------------------------------------------------------------------------------  Cardiac Enzymes  Recent Labs Lab 08/29/15 0549  TROPONINI <0.03   ------------------------------------------------------------------------------------------------------------------  RADIOLOGY:  Dg Chest 2 View  08/29/2015   CLINICAL DATA:  Epigastric pain beginning yesterday evening. History of  gallstones and occurred, hypertension.  EXAM: CHEST  2 VIEW  COMPARISON:  Chest radiograph August 04, 2015  FINDINGS: Cardiac silhouette is normal. Tortuous calcified aorta. No pleural effusion or focal consolidation. No pneumothorax. Calcifications in RIGHT neck are likely vascular. Mild chronic appearing T11 and T12 compression fracture with focal kyphosis. Osteopenia.  IMPRESSION: No acute cardiopulmonary process.   Electronically Signed   By: Elon Alas M.D.   On: 08/29/2015 06:50    EKG:   Orders placed or performed during the hospital encounter of 08/29/15  . EKG 12-Lead  . EKG 12-Lead    IMPRESSION AND PLAN:   Acute pancreatitis Cholelithiasis Abnormal liver function test Hypokalemia Thrombocytopenia CAD CKD stage 3 HTN HLP  NPO except medication and sips of water, IV fluid support, pain control, zofran prn. Follow up surgeon, Dr. Tollie Pizza. Give potassium supplement and follow-up magnesium level. Continue hypertension medication, aspirin and statin. But hold aspirin before surgery.   All the records are reviewed and case discussed with ED provider and Dr. Majel Homer. Management plans discussed with the patient, family and they are in agreement.  CODE STATUS: Full code  TOTAL TIME TAKING CARE OF THIS PATIENT: 56 minutes.    Demetrios Loll M.D on 08/29/2015 at 8:38 AM  Between 7am to 6pm - Pager -  [phone]  After 6pm go to [email or web address] - password [email or web address] [facility]  Office  [phone]  CC: Primary care physician; Ezequiel Kayser, MD

## 2015-08-29 NOTE — ED Provider Notes (Signed)
[facility] Emergency Department Provider Note  ____________________________________________  Time seen: Approximately 630 AM  I have reviewed the triage vital signs and the nursing notes.   HISTORY  Chief Complaint Chest Pain    HPI Trevor Deleon. is a 81 y.o. male with a history of cholelithiasis who is presenting today with epigastric pain. He says it started about 10 PM last night and feels like a pressure. He had no associated nausea, vomiting, shortness of breath or chest pain. He says that he has had GERD in the past and this feels similar. He does take antacids at home and is compliant with his medications. He does not drink. He said that the pain started when he had gotten up from his desk. He did not eat any of his trigger foods yesterday for his GERD which include sweet food and fried things.The pain is nonradiating. He says it has been constant since 10 PM at a 5 out of 10.   Past Medical History  Diagnosis Date  . GERD (gastroesophageal reflux disease)   . Hypertension   . Hernia, femoral     There are no active problems to display for this patient.   Past Surgical History  Procedure Laterality Date  . Joint replacement    . Cardiac surgery      Current Outpatient Rx  Name  Route  Sig  Dispense  Refill  . Acetaminophen (TYLENOL) 325 MG CAPS   Oral   Take by mouth.         . ALPRAZolam (XANAX) 0.25 MG tablet   Oral   Take 1 tablet by mouth at bedtime as needed.      3   . doxazosin (CARDURA) 4 MG tablet   Oral   Take 4 mg by mouth daily.         . Fish Oil-Cholecalciferol (FISH OIL + D3 PO)   Oral   Take by mouth.         Marland Kitchen lisinopril (PRINIVIL,ZESTRIL) 40 MG tablet   Oral   Take 40 mg by mouth daily.         . Multiple Vitamin (MULTIVITAMIN) capsule   Oral   Take 1 capsule by mouth daily.         Marland Kitchen omeprazole (PRILOSEC) 20 MG capsule   Oral   Take 20 mg by mouth daily.         . Polyethylene  Glycol 3350 (MIRALAX PO)   Oral   Take 1 Dose by mouth daily.          . Psyllium (METAMUCIL PO)   Oral   Take 1 Dose by mouth daily.          . ranitidine (ZANTAC) 150 MG tablet   Oral   Take 150 mg by mouth 2 (two) times daily.         . simvastatin (ZOCOR) 40 MG tablet   Oral   Take 40 mg by mouth daily.         Marland Kitchen triamterene-hydrochlorothiazide (MAXZIDE-25) 37.5-25 MG per tablet   Oral   Take 1 tablet by mouth daily.           Allergies Review of patient's allergies indicates no known allergies.  History reviewed. No pertinent family history.  Social History Social History  Substance Use Topics  . Smoking status: Former Research scientist (life sciences)  . Smokeless tobacco: None  . Alcohol Use: No    Review of Systems Constitutional: No fever/chills Eyes:  No visual changes. ENT: No sore throat. Cardiovascular: Denies chest pain. Respiratory: Denies shortness of breath. Gastrointestinal:  No nausea, no vomiting.  No diarrhea.  No constipation. Genitourinary: Negative for dysuria. Musculoskeletal: Negative for back pain. Skin: Negative for rash. Neurological: Negative for headaches, focal weakness or numbness.  10-point ROS otherwise negative.  ____________________________________________   PHYSICAL EXAM:  VITAL SIGNS: ED Triage Vitals  Enc Vitals Group     BP 08/29/15 0511 113/68 mmHg     Pulse Rate 08/29/15 0511 86     Resp --      Temp 08/29/15 0511 97.5 F (36.4 C)     Temp Source 08/29/15 0511 Oral     SpO2 08/29/15 0511 97 %     Weight 08/29/15 0511 174 lb (78.926 kg)     Height 08/29/15 0511 5\' 11"  (1.803 m)     Head Cir --      Peak Flow --      Pain Score 08/29/15 0532 5     Pain Loc --      Pain Edu? --      Excl. in [place]? --     Constitutional: Alert and oriented. Well appearing and in no acute distress. Eyes: Conjunctivae are normal. PERRL. EOMI. Head: Atraumatic. Nose: No congestion/rhinnorhea. Mouth/Throat: Mucous membranes are moist.   Oropharynx non-erythematous. Neck: No stridor.   Cardiovascular: Normal rate, regular rhythm. Grossly normal heart sounds.  Good peripheral circulation. Respiratory: Normal respiratory effort.  No retractions. Lungs CTAB. Gastrointestinal: Soft with mild tenderness to the epigastrium. There is a negative Murphy sign. No distention. No abdominal bruits. No CVA tenderness. Musculoskeletal: No lower extremity tenderness nor edema.  No joint effusions. Neurologic:  Normal speech and language. No gross focal neurologic deficits are appreciated. No gait instability. Skin:  Skin is warm, dry and intact. No rash noted. Psychiatric: Mood and affect are normal. Speech and behavior are normal.  ____________________________________________   LABS (all labs ordered are listed, but only abnormal results are displayed)  Labs Reviewed  CBC WITH DIFFERENTIAL/PLATELET - Abnormal; Notable for the following:    Platelets 90 (*)    All other components within normal limits  COMPREHENSIVE METABOLIC PANEL - Abnormal; Notable for the following:    Potassium 3.4 (*)    Glucose, Bld 175 (*)    BUN 30 (*)    Creatinine, Ser 1.37 (*)    AST 149 (*)    ALT 68 (*)    Total Bilirubin 2.1 (*)    GFR calc non Af Amer 47 (*)    GFR calc Af Amer 54 (*)    All other components within normal limits  LIPASE, BLOOD - Abnormal; Notable for the following:    Lipase 4978 (*)    All other components within normal limits  TROPONIN I   ____________________________________________  EKG  ED ECG REPORT I, Doran Stabler, the attending physician, personally viewed and interpreted this ECG.   Date: 08/29/2015  EKG Time: 519  Rate: 82  Rhythm: normal EKG, normal sinus rhythm  Axis: Normal axis  Intervals:none  ST&T Change: No ST segment elevation or depression. No abnormal T-wave inversion.  ____________________________________________  RADIOLOGY  Chest x-ray without any acute disease. I personally reviewed  this film. ____________________________________________   PROCEDURES    ____________________________________________   INITIAL IMPRESSION / ASSESSMENT AND PLAN / ED COURSE  Pertinent labs & imaging results that were available during my care of the patient were reviewed by me and considered in  my medical decision making (see chart for details).  ----------------------------------------- 7:52 AM on 08/29/2015 -----------------------------------------  The patient is scheduled to have his gallbladder out on the 25th by Dr. Tamala Julian of the [facility]. I talked to Dr. Tollie Pizza and he recommends admission to medicine for the pancreatitis. I specifically discussed the need for any further imaging at this time and Dr. Tollie Pizza said the patient did not require any further imaging at this time. He is known to have gallstones and likely passed a gallstone causing his pancreatitis. On reevaluation the patient is a 0 out of 10 pain. He denies drinking. The patient and family understand the diagnosis. We reviewed the lab results. Anderson the need for admission and are willing to comply.  ----------------------------------------- 8:04 AM on 08/29/2015 -----------------------------------------  Sign case with Dr. Bridgett Larsson. He says he will discuss further with Dr. Tollie Pizza. ____________________________________________   FINAL CLINICAL IMPRESSION(S) / ED DIAGNOSES  Acute gallstone pancreatitis.    Orbie Pyo, MD 08/29/15 [phone]

## 2015-08-29 NOTE — Progress Notes (Signed)
   08/29/15 1130  Clinical Encounter Type  Visited With Patient and family together  Visit Type Initial  Referral From Nurse  Consult/Referral To Chaplain  Spiritual Encounters  Spiritual Needs Prayer;Emotional  Stress Factors  Patient Stress Factors Exhausted;Health changes  Family Stress Factors Health changes  Met w/patient & family to provide pastoral care and prayer. Chap. Kenlie Seki G. Ladd

## 2015-08-29 NOTE — ED Notes (Signed)
Pt. States epigastric pain that started around 10pm last night.  Pt. States hx of gall bladder stones and GERD.  Pt. Denies SOB.

## 2015-08-30 LAB — BASIC METABOLIC PANEL
Anion gap: 7 (ref 5–15)
BUN: 21 mg/dL — AB (ref 6–20)
CALCIUM: 8.6 mg/dL — AB (ref 8.9–10.3)
CO2: 25 mmol/L (ref 22–32)
CREATININE: 1.3 mg/dL — AB (ref 0.61–1.24)
Chloride: 110 mmol/L (ref 101–111)
GFR calc Af Amer: 58 mL/min — ABNORMAL LOW (ref >60)
GFR, EST NON AFRICAN AMERICAN: 50 mL/min — AB (ref >60)
Glucose, Bld: 97 mg/dL (ref 65–99)
POTASSIUM: 3.8 mmol/L (ref 3.5–5.1)
SODIUM: 142 mmol/L (ref 135–145)

## 2015-08-30 LAB — CBC
HEMATOCRIT: 36.9 % — AB (ref 40.0–52.0)
Hemoglobin: 12.8 g/dL — ABNORMAL LOW (ref 13.0–18.0)
MCH: 30.3 pg (ref 26.0–34.0)
MCHC: 34.6 g/dL (ref 32.0–36.0)
MCV: 87.5 fL (ref 80.0–100.0)
PLATELETS: 73 10*3/uL — AB (ref 150–440)
RBC: 4.22 MIL/uL — ABNORMAL LOW (ref 4.40–5.90)
RDW: 14.3 % (ref 11.5–14.5)
WBC: 4.2 10*3/uL (ref 3.8–10.6)

## 2015-08-30 LAB — MAGNESIUM: MAGNESIUM: 1.8 mg/dL (ref 1.7–2.4)

## 2015-08-30 LAB — LIPASE, BLOOD: LIPASE: 1247 U/L — AB (ref 22–51)

## 2015-08-30 NOTE — Progress Notes (Signed)
[facility] [place] NAME: Trevor Deleon    MR#:  [id number]  DATE OF BIRTH:  05-Sep-1932  SUBJECTIVE:  CHIEF COMPLAINT:   Chief Complaint  Patient presents with  . Chest Pain    Pt. presents to ed with family for chest pain that started around 10pm last night.   No complaint REVIEW OF SYSTEMS:  CONSTITUTIONAL: No fever, fatigue or weakness.  EYES: No blurred or double vision.  EARS, NOSE, AND THROAT: No tinnitus or ear pain.  RESPIRATORY: No cough, shortness of breath, wheezing or hemoptysis.  CARDIOVASCULAR: No chest pain, orthopnea, edema.  GASTROINTESTINAL: No nausea, vomiting, diarrhea or abdominal pain.  GENITOURINARY: No dysuria, hematuria.  ENDOCRINE: No polyuria, nocturia,  HEMATOLOGY: No anemia, easy bruising or bleeding SKIN: No rash or lesion. MUSCULOSKELETAL: No joint pain or arthritis.   NEUROLOGIC: No tingling, numbness, weakness.  PSYCHIATRY: No anxiety or depression.   DRUG ALLERGIES:  No Known Allergies  VITALS:  Blood pressure 122/74, pulse 75, temperature 97.9 F (36.6 C), temperature source Oral, resp. rate 16, height 5\' 11"  (1.803 m), weight 81.693 kg (180 lb 1.6 oz), SpO2 98 %.  PHYSICAL EXAMINATION:  GENERAL:  81 y.o.-year-old patient lying in the bed with no acute distress.  EYES: Pupils equal, round, reactive to light and accommodation. No scleral icterus. Extraocular muscles intact.  HEENT: Head atraumatic, normocephalic. Oropharynx and nasopharynx clear. Moist oral mucosa. NECK:  Supple, no jugular venous distention. No thyroid enlargement, no tenderness.  LUNGS: Normal breath sounds bilaterally, no wheezing, rales,rhonchi or crepitation. No use of accessory muscles of respiration.  CARDIOVASCULAR: S1, S2 normal. No murmurs, rubs, or gallops.  ABDOMEN: Soft, nontender, nondistended. Bowel sounds present. No organomegaly or mass.  EXTREMITIES: No pedal edema, cyanosis, or clubbing.  NEUROLOGIC:  Cranial nerves II through XII are intact. Muscle strength 5/5 in all extremities. Sensation intact. Gait not checked.  PSYCHIATRIC: The patient is alert and oriented x 3.  SKIN: No obvious rash, lesion, or ulcer.    LABORATORY PANEL:   CBC  Recent Labs Lab 08/30/15 0607  WBC 4.2  HGB 12.8*  HCT 36.9*  PLT 73*   ------------------------------------------------------------------------------------------------------------------  Chemistries   Recent Labs Lab 08/29/15 0549 08/30/15 0607  NA 139 142  K 3.4* 3.8  CL 101 110  CO2 29 25  GLUCOSE 175* 97  BUN 30* 21*  CREATININE 1.37* 1.30*  CALCIUM 9.4 8.6*  MG  --  1.8  AST 149*  --   ALT 68*  --   ALKPHOS 93  --   BILITOT 2.1*  --    ------------------------------------------------------------------------------------------------------------------  Cardiac Enzymes  Recent Labs Lab 08/29/15 0549  TROPONINI <0.03   ------------------------------------------------------------------------------------------------------------------  RADIOLOGY:  Dg Chest 2 View  08/29/2015   CLINICAL DATA:  Epigastric pain beginning yesterday evening. History of gallstones and occurred, hypertension.  EXAM: CHEST  2 VIEW  COMPARISON:  Chest radiograph August 04, 2015  FINDINGS: Cardiac silhouette is normal. Tortuous calcified aorta. No pleural effusion or focal consolidation. No pneumothorax. Calcifications in RIGHT neck are likely vascular. Mild chronic appearing T11 and T12 compression fracture with focal kyphosis. Osteopenia.  IMPRESSION: No acute cardiopulmonary process.   Electronically Signed   By: Elon Alas M.D.   On: 08/29/2015 06:50    EKG:   Orders placed or performed during the hospital encounter of 08/29/15  . EKG 12-Lead  . EKG 12-Lead    ASSESSMENT AND PLAN:   Acute pancreatitis. Lipase is  trending down. Start liquid diet today and a follow-up lipase tomorrow. Cholelithiasis without cholecystitis. Follow-up  with Dr. Bary Castilla for possible surgery. Abnormal liver function test. Follow-up CMP. Hypokalemia. Improved. Thrombocytopenia. Chronic and stable. CAD. Hold aspirin for possible surgery. CKD stage 3. Stable HTN. Controlled, continue hypertension medication. HLP. Continue statin.   All the records are reviewed and case discussed with Care Management/Social Workerr. Management plans discussed with the patient, family and they are in agreement.  CODE STATUS: Full code  TOTAL TIME TAKING CARE OF THIS PATIENT: 37 minutes.   POSSIBLE D/C IN 2-3 DAYS, DEPENDING ON CLINICAL CONDITION.   Demetrios Loll M.D on 08/30/2015 at 12:29 PM  Between 7am to 6pm - Pager - [phone]  After 6pm go to [email or web address] - password [email or web address] [facility]  Office  [phone]  CC: Primary care physician; Ezequiel Kayser, MD

## 2015-08-31 DIAGNOSIS — E440 Moderate protein-calorie malnutrition: Secondary | ICD-10-CM | POA: Insufficient documentation

## 2015-08-31 LAB — COMPREHENSIVE METABOLIC PANEL
ALBUMIN: 3.3 g/dL — AB (ref 3.5–5.0)
ALK PHOS: 71 U/L (ref 38–126)
ALT: 34 U/L (ref 17–63)
ANION GAP: 6 (ref 5–15)
AST: 25 U/L (ref 15–41)
BUN: 17 mg/dL (ref 6–20)
CALCIUM: 8.6 mg/dL — AB (ref 8.9–10.3)
CO2: 23 mmol/L (ref 22–32)
Chloride: 112 mmol/L — ABNORMAL HIGH (ref 101–111)
Creatinine, Ser: 1.17 mg/dL (ref 0.61–1.24)
GFR calc non Af Amer: 57 mL/min — ABNORMAL LOW (ref >60)
GLUCOSE: 127 mg/dL — AB (ref 65–99)
POTASSIUM: 4.1 mmol/L (ref 3.5–5.1)
SODIUM: 141 mmol/L (ref 135–145)
TOTAL PROTEIN: 5.8 g/dL — AB (ref 6.5–8.1)
Total Bilirubin: 1.2 mg/dL (ref 0.3–1.2)

## 2015-08-31 LAB — LIPASE, BLOOD: Lipase: 163 U/L — ABNORMAL HIGH (ref 22–51)

## 2015-08-31 NOTE — Progress Notes (Signed)
He was seen for surgery consultation.  H&P to follow.  Diagnosis chronic cholecystitis cholelithiasis, biliary pancreatitis  Anticipate laparoscopic cholecystectomy on 05/03/2015.  Requested preop  Cardiology consultation  Hold aspirin

## 2015-08-31 NOTE — H&P (Signed)
History of Present Illness:He came in the emergency room with a chief complaint of epigastric pain.  He has a history of numerous episodes of epigastric pain over appeared of several months.  He recently did have a protracted spell of pain with associated nausea and minimal vomiting.  He had evaluation at the emergency room recently with elevated bilirubin at 5.0 and ultrasound findings of gallstones.  Subsequent bilirubin was returned to normal.  He also noted transient dark discoloration of his urine.  He reports no change in color of stool.  He has about 25-30 lb weight loss over the past year.  He was recently seen in the office and tentatively scheduled for gallbladder surgery pending cardiology consultation.    He came into the emergency room this time with recurrent epigastric pain but no nausea or vomiting no diarrhea.  No chills or fever.  Laboratory work demonstrated markedly elevated lipase at 4978 which subsequently has improved with last lipase down to 163.  His total bilirubin was 2.1 but has decreased to 1.2.  Serum albumin is 3.3, potassium 4.1   Recent ultrasound demonstrated gallstones and sludge there was no thickening of the gallbladder wall.  Common bile duct 4.6 mm in diameter.    Since admission he reports no further abdominal pains.  He is tolerating a full liquid diet well.  He is ambulating in the hallway.  He has had no chills or fever.  No nausea vomiting.  Past Medical History: We discussed his history of myocardial infarction and history of stent. He reports he has had some 15 stress test over the years the last stress test was last year. He sees Dr. Ubaldo Glassing for cardiology care. He reports no recent chest pains. Other past medical history was reviewed as noted below. Past Medical History  Diagnosis Date  . GERD (gastroesophageal reflux disease)   . Hypertension   . Hernia, femoral   . CKD (chronic kidney disease) stage 3, GFR 30-59 ml/min   . BPH (benign prostatic  hyperplasia)   . Hyperlipidemia     Problem List: Patient Active Problem List   Diagnosis Date Noted  . Malnutrition of moderate degree 08/31/2015  . Acute pancreatitis 08/29/2015  . Cholelithiasis 08/29/2015    Past Surgical History: Past Surgical History  Procedure Laterality Date  . Joint replacement    . Cardiac surgery    . Tonsillectomy    . Appendectomy      Allergies: No Known Allergies  Home Medications: Prescriptions prior to admission  Medication Sig Dispense Refill Last Dose  . acetaminophen (TYLENOL) 500 MG tablet Take 1,000 mg by mouth every 6 (six) hours as needed for mild pain or moderate pain (joint pain).   Past Week at Unknown time  . ALPRAZolam (XANAX) 0.25 MG tablet Take 1 tablet by mouth at bedtime as needed for sleep.   3 Past Month at Unknown time  . aspirin EC 81 MG tablet Take 81 mg by mouth daily.   08/28/2015 at Unknown time  . doxazosin (CARDURA) 4 MG tablet Take 4 mg by mouth daily.   08/28/2015 at Unknown time  . Multiple Vitamin (MULTIVITAMIN) tablet Take 1 tablet by mouth daily.   08/28/2015 at Unknown time  . Omega-3 Fatty Acids (FISH OIL) 1000 MG CAPS Take 3,000 mg by mouth 2 (two) times daily.   08/28/2015 at Unknown time  . omeprazole (PRILOSEC) 20 MG capsule Take 40 mg by mouth daily.    08/28/2015 at Unknown time  . Polyethylene  Glycol 3350 (MIRALAX PO) Take 17 g by mouth daily.    08/28/2015 at Unknown time  . potassium chloride (K-DUR) 10 MEQ tablet Take 30 mEq by mouth daily.   08/28/2015 at Unknown time  . Psyllium (METAMUCIL PO) Take 1 Dose by mouth daily.    08/28/2015 at Unknown time  . ranitidine (ZANTAC) 150 MG tablet Take 150 mg by mouth 2 (two) times daily.   08/28/2015 at Unknown time  . simvastatin (ZOCOR) 40 MG tablet Take 40 mg by mouth at bedtime.    08/28/2015 at Unknown time  . triamterene-hydrochlorothiazide (MAXZIDE-25) 37.5-25 MG per tablet Take 1 tablet by mouth daily.   08/28/2015 at Unknown time   Home medication reconciliation  was completed with the patient.   Scheduled Inpatient Medications:   . doxazosin  4 mg Oral Q2000  . enoxaparin (LOVENOX) injection  40 mg Subcutaneous Q24H  . famotidine  20 mg Oral BID  . pantoprazole  40 mg Oral Daily  . potassium chloride  30 mEq Oral Daily  . simvastatin  40 mg Oral QHS        PRN Inpatient Medications:  acetaminophen **OR** acetaminophen, albuterol, ALPRAZolam, HYDROcodone-acetaminophen, morphine injection, ondansetron **OR** ondansetron (ZOFRAN) IV  Family History: family history includes Heart attack in his father; Hypertension in his mother; Prostate cancer in his father; Stroke in his mother.  The patient's family history is negative for inflammatory bowel disorders, GI malignancy, or solid organ transplantation.  Social History:   reports that he has quit smoking. He does not have any smokeless tobacco history on file. He reports that he does not drink alcohol or use illicit drugs. The patient denies ETOH, tobacco, or drug use.   Review of Systems:He has had 25-30 lb weight loss over the past few years.  He does have some chronic ringing in his ears.  He reports no other recent acute illness such as cough cold or sore throat.  He has had no recent chest pains.  No dyspnea on exertion.  He does have occasional heartburn he is voiding satisfactorily, moving his bowel satisfactorily.  He had recent scleral icterus which soon resolved.  He reports no ankle edema.  No recent sores or boils.  Review of systems otherwise negative.    Physical Examination: BP 135/56 mmHg  Pulse 52  Temp(Src) 97.9 F (36.6 C) (Oral)  Resp 18  Ht 5\' 11"  (1.803 m)  Wt 81.693 kg (180 lb 1.6 oz)  BMI 25.13 kg/m2  SpO2 99%   GENERAL:  The patient is awake, alert and oriented, ambulatory and in no acute distress.  HEENT:    Pupils equal, reactive to light, extraocular movements are intact, sclerae are clear, palpebral conjunctiva normal red color.  Pharynx clear.  LUNGS:    Patient is in no respiratory distress.  Lungs are clear without rales rhonchi or wheezes.   HEART:   Regular rhythm,  normal S1-S2 without murmur.  ABDOMEN:   Nondistended soft and nontender, with no palpable mass, no hepatomegaly.  NEUROLOGIC:  The patient has a symmetrical facial expression and is awake alert and oriented and moving all extremities.  EXTREMITIES: Well developed well-nourished and with no dependent edema.  Data: Lab Results  Component Value Date   WBC 4.2 08/30/2015   HGB 12.8* 08/30/2015   HCT 36.9* 08/30/2015   MCV 87.5 08/30/2015   PLT 73* 08/30/2015    Recent Labs Lab 08/29/15 0549 08/30/15 0607  HGB 14.4 12.8*   Lab Results  Component  Value Date   NA 141 08/31/2015   K 4.1 08/31/2015   CL 112* 08/31/2015   CO2 23 08/31/2015   BUN 17 08/31/2015   CREATININE 1.17 08/31/2015   Lab Results  Component Value Date   ALT 34 08/31/2015   AST 25 08/31/2015   ALKPHOS 71 08/31/2015   BILITOT 1.2 08/31/2015   No results for input(s): APTT, INR, PTT in the last 168 hours. Assessment/Plan:Chronic cholecystitis cholelithiasis, biliary pancreatitis   Recommendations I have consulted and discussed with [facility]  Cardiology consultation.  It appears that it would be safe for surgery.  I recommended laparoscopic cholecystectomy.  I have discussed the operation care risk and benefits with him in detail.  Plan is to stay off of aspirin, discontinue Lovenox prior to  surgery.  Set up laparoscopic cholecystectomy for Wednesday October 12  Thank you for the consult. Please call with questions or concerns.  Rochel Brome, MD

## 2015-08-31 NOTE — Progress Notes (Signed)
[facility] [place] NAME: Trevor Deleon    MR#:  [id number]  DATE OF BIRTH:  03/25/1932  SUBJECTIVE:  CHIEF COMPLAINT:   Chief Complaint  Patient presents with  . Chest Pain    Pt. presents to ed with family for chest pain that started around 10pm last night.   No complaint REVIEW OF SYSTEMS:  CONSTITUTIONAL: No fever, fatigue or weakness.  EYES: No blurred or double vision.  EARS, NOSE, AND THROAT: No tinnitus or ear pain.  RESPIRATORY: No cough, shortness of breath, wheezing or hemoptysis.  CARDIOVASCULAR: No chest pain, orthopnea, edema.  GASTROINTESTINAL: No nausea, vomiting, diarrhea or abdominal pain.  GENITOURINARY: No dysuria, hematuria.  ENDOCRINE: No polyuria, nocturia,  HEMATOLOGY: No anemia, easy bruising or bleeding SKIN: No rash or lesion. MUSCULOSKELETAL: No joint pain or arthritis.   NEUROLOGIC: No tingling, numbness, weakness.  PSYCHIATRY: No anxiety or depression.   DRUG ALLERGIES:  No Known Allergies  VITALS:  Blood pressure 135/56, pulse 52, temperature 97.9 F (36.6 C), temperature source Oral, resp. rate 18, height 5\' 11"  (1.803 m), weight 81.693 kg (180 lb 1.6 oz), SpO2 99 %.  PHYSICAL EXAMINATION:  GENERAL:  81 y.o.-year-old patient lying in the bed with no acute distress.  EYES: Pupils equal, round, reactive to light and accommodation. No scleral icterus. Extraocular muscles intact.  HEENT: Head atraumatic, normocephalic. Oropharynx and nasopharynx clear. Moist oral mucosa. NECK:  Supple, no jugular venous distention. No thyroid enlargement, no tenderness.  LUNGS: Normal breath sounds bilaterally, no wheezing, rales,rhonchi or crepitation. No use of accessory muscles of respiration.  CARDIOVASCULAR: S1, S2 normal. No murmurs, rubs, or gallops.  ABDOMEN: Soft, nontender, nondistended. Bowel sounds present. No organomegaly or mass.  EXTREMITIES: No pedal edema, cyanosis, or clubbing.  NEUROLOGIC:  Cranial nerves II through XII are intact. Muscle strength 5/5 in all extremities. Sensation intact. Gait not checked.  PSYCHIATRIC: The patient is alert and oriented x 3.  SKIN: No obvious rash, lesion, or ulcer.    LABORATORY PANEL:   CBC  Recent Labs Lab 08/30/15 0607  WBC 4.2  HGB 12.8*  HCT 36.9*  PLT 73*   ------------------------------------------------------------------------------------------------------------------  Chemistries   Recent Labs Lab 08/30/15 0607 08/31/15 0629  NA 142 141  K 3.8 4.1  CL 110 112*  CO2 25 23  GLUCOSE 97 127*  BUN 21* 17  CREATININE 1.30* 1.17  CALCIUM 8.6* 8.6*  MG 1.8  --   AST  --  25  ALT  --  34  ALKPHOS  --  71  BILITOT  --  1.2   ------------------------------------------------------------------------------------------------------------------  Cardiac Enzymes  Recent Labs Lab 08/29/15 0549  TROPONINI <0.03   ------------------------------------------------------------------------------------------------------------------  RADIOLOGY:  No results found.  EKG:   Orders placed or performed during the hospital encounter of 08/29/15  . EKG 12-Lead  . EKG 12-Lead    ASSESSMENT AND PLAN:   Acute pancreatitis. Lipase is trending down to normal range. Advanced to heart healthy diet.  Cholelithiasis without cholecystitis. Dr. Tamala Julian suggested surgery on Wednesday. Follow-up cardiology consult for preoperative clearance.  Abnormal liver function test. Liver function test is normal today. Hypokalemia. Improved. Thrombocytopenia. Chronic and stable. CAD. Hold aspirin for surgery. CKD stage 3. Stable HTN. Controlled, continue hypertension medication. HLP. Continue statin.   All the records are reviewed and case discussed with Care Management/Social Workerr. Management plans discussed with the patient, family and they are in agreement.  CODE STATUS: Full code  TOTAL  TIME TAKING CARE OF THIS PATIENT: 33 minutes.    POSSIBLE D/C IN 3 DAYS, DEPENDING ON CLINICAL CONDITION.   Demetrios Loll M.D on 08/31/2015 at 3:26 PM  Between 7am to 6pm - Pager - [phone]  After 6pm go to [email or web address] - password [email or web address]  Office  [phone]  CC: Primary care physician; Ezequiel Kayser, MD

## 2015-08-31 NOTE — Consult Note (Signed)
Reason for Consult :Preoperative evaluation  for surgery Referring Physician:   Dr. Nicholes Stairs, Dr. Caryl Comes Cardiologists Dr. Inetta Fermo. is an 81 y.o. male.  HPI:  81 Yo white male usually followed by [facility] clinic cardiology has a known history of coronary disease include myocardial infarction in 1999 with PCI and stent. Patient has moderate coronary disease and had a borderline Myoview over year ago. The patient has had significant abdominal discomfort recently suggesting gallbladder disease requiring surgery. The patient reportedly is had surgery scheduled for later on this month September 25. Patient denies any recent angina shortness of breath or any cardiac symptoms. Patient is been compliant with his medication is on lipid management aspirin. The patient still has significant reflux symptoms but is doing better now.  Past Medical History  Diagnosis Date  . GERD (gastroesophageal reflux disease)   . Hypertension   . Hernia, femoral   . CKD (chronic kidney disease) stage 3, GFR 30-59 ml/min   . BPH (benign prostatic hyperplasia)   . Hyperlipidemia     Past Surgical History  Procedure Laterality Date  . Joint replacement    . Cardiac surgery    . Tonsillectomy    . Appendectomy      Family History  Problem Relation Age of Onset  . Hypertension Mother   . Stroke Mother   . Prostate cancer Father   . Heart attack Father     Social History:  reports that he has quit smoking. He does not have any smokeless tobacco history on file. He reports that he does not drink alcohol or use illicit drugs.  Allergies: No Known Allergies  Medications: I have reviewed the patient's current medications.  Results for orders placed or performed during the hospital encounter of 08/29/15 (from the past 48 hour(s))  Basic metabolic panel     Status: Abnormal   Collection Time: 08/30/15  6:07 AM  Result Value Ref Range   Sodium 142 135 - 145 mmol/L   Potassium 3.8 3.5 - 5.1 mmol/L    Chloride 110 101 - 111 mmol/L   CO2 25 22 - 32 mmol/L   Glucose, Bld 97 65 - 99 mg/dL   BUN 21 (H) 6 - 20 mg/dL   Creatinine, Ser 1.30 (H) 0.61 - 1.24 mg/dL   Calcium 8.6 (L) 8.9 - 10.3 mg/dL   GFR calc non Af Amer 50 (L) >60 mL/min   GFR calc Af Amer 58 (L) >60 mL/min    Comment: (NOTE) The eGFR has been calculated using the CKD EPI equation. This calculation has not been validated in all clinical situations. eGFR's persistently <60 mL/min signify possible Chronic Kidney Disease.    Anion gap 7 5 - 15  CBC     Status: Abnormal   Collection Time: 08/30/15  6:07 AM  Result Value Ref Range   WBC 4.2 3.8 - 10.6 K/uL   RBC 4.22 (L) 4.40 - 5.90 MIL/uL   Hemoglobin 12.8 (L) 13.0 - 18.0 g/dL   HCT 36.9 (L) 40.0 - 52.0 %   MCV 87.5 80.0 - 100.0 fL   MCH 30.3 26.0 - 34.0 pg   MCHC 34.6 32.0 - 36.0 g/dL   RDW 14.3 11.5 - 14.5 %   Platelets 73 (L) 150 - 440 K/uL  Lipase, blood     Status: Abnormal   Collection Time: 08/30/15  6:07 AM  Result Value Ref Range   Lipase 1247 (H) 22 - 51 U/L  Comment: RESULT CONFIRMED BY MANUAL DILUTION  Magnesium     Status: None   Collection Time: 08/30/15  6:07 AM  Result Value Ref Range   Magnesium 1.8 1.7 - 2.4 mg/dL  Lipase, blood     Status: Abnormal   Collection Time: 08/31/15  6:29 AM  Result Value Ref Range   Lipase 163 (H) 22 - 51 U/L  Comprehensive metabolic panel     Status: Abnormal   Collection Time: 08/31/15  6:29 AM  Result Value Ref Range   Sodium 141 135 - 145 mmol/L   Potassium 4.1 3.5 - 5.1 mmol/L   Chloride 112 (H) 101 - 111 mmol/L   CO2 23 22 - 32 mmol/L   Glucose, Bld 127 (H) 65 - 99 mg/dL   BUN 17 6 - 20 mg/dL   Creatinine, Ser 1.17 0.61 - 1.24 mg/dL   Calcium 8.6 (L) 8.9 - 10.3 mg/dL   Total Protein 5.8 (L) 6.5 - 8.1 g/dL   Albumin 3.3 (L) 3.5 - 5.0 g/dL   AST 25 15 - 41 U/L   ALT 34 17 - 63 U/L   Alkaline Phosphatase 71 38 - 126 U/L   Total Bilirubin 1.2 0.3 - 1.2 mg/dL   GFR calc non Af Amer 57 (L) >60 mL/min    GFR calc Af Amer >60 >60 mL/min    Comment: (NOTE) The eGFR has been calculated using the CKD EPI equation. This calculation has not been validated in all clinical situations. eGFR's persistently <60 mL/min signify possible Chronic Kidney Disease.    Anion gap 6 5 - 15    No results found.  Review of Systems  Constitutional: Negative.   HENT: Negative.   Eyes: Negative.   Respiratory: Negative.   Cardiovascular: Negative.   Gastrointestinal: Positive for heartburn, nausea and abdominal pain.  Genitourinary: Negative.   Musculoskeletal: Negative.   Neurological: Negative.   Endo/Heme/Allergies: Negative.   Psychiatric/Behavioral: Negative.    Blood pressure 135/56, pulse 52, temperature 97.9 F (36.6 C), temperature source Oral, resp. rate 18, height _0  (1.803 m), weight 81.693 kg (180 lb 1.6 oz), SpO2 99 %. Physical Exam  Constitutional: He is oriented to person, place, and time. He appears well-developed and well-nourished.  HENT:  Head: Normocephalic and atraumatic.  Eyes: Conjunctivae and EOM are normal. Pupils are equal, round, and reactive to light.  Neck: Normal range of motion. Neck supple.  Cardiovascular: Normal rate, regular rhythm and normal heart sounds.   Respiratory: Effort normal and breath sounds normal.  GI: Soft. Bowel sounds are normal.  Musculoskeletal: Normal range of motion.  Neurological: He is alert and oriented to person, place, and time. He has normal reflexes.  Skin: Skin is warm and dry.  Psychiatric: He has a normal mood and affect.    Assessment/Plan:  preop for surgery  coronary artery disease  hyperlipidemia  hypertension  GERD  diabetes  history of myocardial infarction 1999  moderate coronary artery disease  chronic renal insufficiency . PLAN  agree with conservative medical therapy  recommend outpatient Myoview for preoperative assessment  also recommend echocardiogram for preoperative assessment  continue simvastatin  therapy  continue Zantac possibly omeprazole for reflux symptoms  pain control as necessary for gallbladder  patient should be an acceptable surgical risk  will have the patient follow-up with his cardiologist as an outpatient after noninvasive cardiac studies  case was discussed with his cardiologist Dr Maxcine Ham D. 08/31/2015, 4:19 PM

## 2015-08-31 NOTE — Progress Notes (Signed)
Per patient

## 2015-08-31 NOTE — Progress Notes (Signed)
Initial Nutrition Assessment  DOCUMENTATION CODES:   Non-severe (moderate) malnutrition in context of chronic illness  INTERVENTION:  Meals and snacks: Cater to pt preferences Medical Nutrition Supplement Therapy: Will add mightyshake BID for added nutrition   NUTRITION DIAGNOSIS:   Inadequate oral intake related to altered GI function as evidenced by  (on liquids).    GOAL:   Patient will meet greater than or equal to 90% of their needs    MONITOR:    (Energy intake, Digestive system, Electrolyte and renal profile)  REASON FOR ASSESSMENT:   Malnutrition Screening Tool    ASSESSMENT:      Pt admitted with pancreatitis, chest pain, cholelithiasis  Past Medical History  Diagnosis Date  . GERD (gastroesophageal reflux disease)   . Hypertension   . Hernia, femoral   . CKD (chronic kidney disease) stage 3, GFR 30-59 ml/min   . BPH (benign prostatic hyperplasia)   . Hyperlipidemia     Current Nutrition: tolerating liquids per pt.    Food/Nutrition-Related History: Pt reports eating only 2 meals per day for the last 3-4 weeks secondary to abdominal pain   Medications: NS with KCL at 30ml/hr, KCL, protonix  Electrolyte/Renal Profile and Glucose Profile:   Recent Labs Lab 08/29/15 0549 08/30/15 0607 08/31/15 0629  NA 139 142 141  K 3.4* 3.8 4.1  CL 101 110 112*  CO2 29 25 23   BUN 30* 21* 17  CREATININE 1.37* 1.30* 1.17  CALCIUM 9.4 8.6* 8.6*  MG  --  1.8  --   GLUCOSE 175* 97 127*   Protein Profile:   Recent Labs Lab 08/29/15 0549 08/31/15 0629  ALBUMIN 4.5 3.3*    Gastrointestinal Profile: Last BM:10/10   Nutrition-Focused Physical Exam Findings: Nutrition-Focused physical exam completed. Findings are normal to mild/moderate fat depletion, normal to mild/moderate muscle depletion, and mild edema.      Weight Change: Pt reports 30 pound weight loss in the last 1 and half years (14% weight loss)    Diet Order:  Diet full liquid Room  service appropriate?: Yes; Fluid consistency:: Thin  Skin:   reviewed   Height:   Ht Readings from Last 1 Encounters:  08/29/15 5\' 11"  (1.803 m)    Weight:   Wt Readings from Last 1 Encounters:  08/29/15 180 lb 1.6 oz (81.693 kg)        BMI:  Body mass index is 25.13 kg/(m^2).  Estimated Nutritional Needs:   Kcal:  BEE 1547 kcals (IF 1.0-1.2, AF 1.3) 2011-2143 kcals/d  Protein:  (1.0-1.2 g/kg) 82-98 g/d  Fluid:  (30-11ml/kg) 2460-2819ml/d  EDUCATION NEEDS:   No education needs identified at this time  [place]. Zenia Resides, [place], [place] (pager)

## 2015-08-31 NOTE — Care Management Important Message (Signed)
Important Message  Patient Details  Name: Trevor Deleon. MRN: [id number] Date of Birth: 25-Jul-1932   Medicare Important Message Given:  Yes-second notification given    Alvie Heidelberg, RN 08/31/2015, 1:10 PM

## 2015-09-01 ENCOUNTER — Inpatient Hospital Stay
Admit: 2015-09-01 | Discharge: 2015-09-01 | Disposition: A | Discharge status: Home / Self Care | Payer: Medicare Other | Attending: Internal Medicine | Admitting: Internal Medicine

## 2015-09-01 NOTE — Progress Notes (Signed)
*  PRELIMINARY RESULTS* Echocardiogram 2D Echocardiogram has been performed.  Trevor Deleon 09/01/2015, 3:15 PM

## 2015-09-01 NOTE — Progress Notes (Signed)
Subjective:   doing better denies any significant pain on ostium from denies any chest pain palpitations or tachycardia  Objective:  Vital Signs in the last 24 hours: Temp:  [97.7 F (36.5 C)-97.9 F (36.6 C)] 97.7 F (36.5 C) (10/11 1255) Pulse Rate:  [51-73] 73 (10/11 1255) Resp:  [18-22] 18 (10/11 1255) BP: (129-148)/(54-70) 129/70 mmHg (10/11 1255) SpO2:  [97 %-98 %] 97 % (10/11 1255)  Intake/Output from previous day: 10/10 0701 - 10/11 0700 In: 2280 [P.O.:2280] Out: 250 [Urine:250] Intake/Output from this shift: Total I/O In: 480 [P.O.:480] Out: 0   Physical Exam: General appearance: cooperative and appears stated age Neck: no adenopathy, no carotid bruit, no JVD, supple, symmetrical, trachea midline and thyroid not enlarged, symmetric, no tenderness/mass/nodules Lungs: clear to auscultation bilaterally Heart: regular rate and rhythm, S1, S2 normal, no murmur, click, rub or gallop Abdomen: soft, non-tender; bowel sounds normal; no masses,  no organomegaly Extremities: extremities normal, atraumatic, no cyanosis or edema Pulses: 2+ and symmetric Skin: Skin color, texture, turgor normal. No rashes or lesions Neurologic: Alert and oriented X 3, normal strength and tone. Normal symmetric reflexes. Normal coordination and gait  Lab Results:  Recent Labs  08/30/15 0607  WBC 4.2  HGB 12.8*  PLT 73*    Recent Labs  08/30/15 0607 08/31/15 0629  NA 142 141  K 3.8 4.1  CL 110 112*  CO2 25 23  GLUCOSE 97 127*  BUN 21* 17  CREATININE 1.30* 1.17   No results for input(s): TROPONINI in the last 72 hours.  Invalid input(s): CK, MB Hepatic Function Panel  Recent Labs  08/31/15 0629  PROT 5.8*  ALBUMIN 3.3*  AST 25  ALT 34  ALKPHOS 71  BILITOT 1.2   No results for input(s): CHOL in the last 72 hours. No results for input(s): PROTIME in the last 72 hours.  Imaging: Imaging results have been reviewed  Cardiac Studies:  Assessment/Plan:  Angina Chest  Pain Coronary Artery Disease Ischemic Heart Disease Palpitations Shortness of Breath   preop for gallbladder surgery  pancreatitis related to gallstones  GERD  mild anxiety  hyperlipidemia  thrombocytopenia  chronic renal insufficiency mild possibly related to dehydration . PLAN  patient should be an acceptable surgical risk for gallbladder surgery  do not recommend any significant changes in medical therapy  consider low-dose beta-blockade therapy pre and postop  continue Protonix for reflux symptoms  agree with simvastatin for hyperlipidemia   Continue doxazosin was  For BPH  outpatient follow-up with Cardiology as an outpatient  With Dr. Ubaldo Glassing  LOS: 3 days    CALLWOOD,DWAYNE D. 09/01/2015, 3:54 PM

## 2015-09-01 NOTE — Progress Notes (Signed)
[place] [facility] [place] NAME: Trevor Deleon    MR#:  [id number]  DATE OF BIRTH:  [birth year]  SUBJECTIVE:  CHIEF COMPLAINT:   Chief Complaint  Patient presents with  . Chest Pain    Pt. presents to ed with family for chest pain that started around 10pm last night.   No complaint REVIEW OF SYSTEMS:  CONSTITUTIONAL: No fever, fatigue or weakness.  EYES: No blurred or double vision.  EARS, NOSE, AND THROAT: No tinnitus or ear pain.  RESPIRATORY: No cough, shortness of breath, wheezing or hemoptysis.  CARDIOVASCULAR: No chest pain, orthopnea, edema.  GASTROINTESTINAL: No nausea, vomiting, diarrhea or abdominal pain.  GENITOURINARY: No dysuria, hematuria.  ENDOCRINE: No polyuria, nocturia,  HEMATOLOGY: No anemia, easy bruising or bleeding SKIN: No rash or lesion. MUSCULOSKELETAL: No joint pain or arthritis.   NEUROLOGIC: No tingling, numbness, weakness.  PSYCHIATRY: No anxiety or depression.   DRUG ALLERGIES:  No Known Allergies  VITALS:  Blood pressure 129/70, pulse 73, temperature 97.7 F (36.5 C), temperature source Oral, resp. rate 18, height 5\' 11"  (1.803 m), weight 81.693 kg (180 lb 1.6 oz), SpO2 97 %.  PHYSICAL EXAMINATION:  GENERAL:  81 y.o.-year-old patient lying in the bed with no acute distress.  EYES: Pupils equal, round, reactive to light and accommodation. No scleral icterus. Extraocular muscles intact.  HEENT: Head atraumatic, normocephalic. Oropharynx and nasopharynx clear. Moist oral mucosa. NECK:  Supple, no jugular venous distention. No thyroid enlargement, no tenderness.  LUNGS: Normal breath sounds bilaterally, no wheezing, rales,rhonchi or crepitation. No use of accessory muscles of respiration.  CARDIOVASCULAR: S1, S2 normal. No murmurs, rubs, or gallops.  ABDOMEN: Soft, nontender, nondistended. Bowel sounds present. No organomegaly or mass.  EXTREMITIES: No pedal edema, cyanosis, or clubbing.  NEUROLOGIC:  Cranial nerves II through XII are intact. Muscle strength 5/5 in all extremities. Sensation intact. Gait not checked.  PSYCHIATRIC: The patient is alert and oriented x 3.  SKIN: No obvious rash, lesion, or ulcer.    LABORATORY PANEL:   CBC  Recent Labs Lab 08/30/15 0607  WBC 4.2  HGB 12.8*  HCT 36.9*  PLT 73*   ------------------------------------------------------------------------------------------------------------------  Chemistries   Recent Labs Lab 08/30/15 0607 08/31/15 0629  NA 142 141  K 3.8 4.1  CL 110 112*  CO2 25 23  GLUCOSE 97 127*  BUN 21* 17  CREATININE 1.30* 1.17  CALCIUM 8.6* 8.6*  MG 1.8  --   AST  --  25  ALT  --  34  ALKPHOS  --  71  BILITOT  --  1.2   ------------------------------------------------------------------------------------------------------------------  Cardiac Enzymes  Recent Labs Lab 08/29/15 0549  TROPONINI <0.03   ------------------------------------------------------------------------------------------------------------------  RADIOLOGY:  No results found.  EKG:   Orders placed or performed during the hospital encounter of 08/29/15  . EKG 12-Lead  . EKG 12-Lead    ASSESSMENT AND PLAN:   Acute pancreatitis. Lipase is trending down to normal range. He tolerated heart healthy diet.  Cholelithiasis without cholecystitis. Dr. Tamala Julian suggested laparoscopic cholecystectomy tomorrow. Dr. Clayborn Bigness did consult for preoperative clearance.  Abnormal liver function test. Liver function test is normal today. Hypokalemia. Improved. Thrombocytopenia. Chronic and stable. CAD. Hold aspirin for surgery. CKD stage 3. Stable HTN. Controlled, continue hypertension medication. HLP. Continue statin.   All the records are reviewed and case discussed with Care Management/Social Workerr. Management plans discussed with the patient, family and they are in agreement.  CODE STATUS: Full code  TOTAL TIME TAKING CARE OF THIS  PATIENT: 28 minutes.   POSSIBLE D/C IN 3 DAYS, DEPENDING ON CLINICAL CONDITION.   Demetrios Loll M.D on 09/01/2015 at 1:38 PM  Between 7am to 6pm - Pager - [phone]  After 6pm go to [email or web address] - password [email or web address] [facility]  Office  [phone]  CC: Primary care physician; Ezequiel Kayser, MD

## 2015-09-01 NOTE — Progress Notes (Signed)
He was seen earlier in the day. He reports no abdominal pain. He has been tolerating his diet satisfactorily. Vital signs stable.  He was awake alert and oriented. Abdomen soft flat and nontender.  Diagnosis chronic cholecystitis cholelithiasis and biliary pancreatitis  Plan is laparoscopic cholecystectomy tomorrow as discussed.

## 2015-09-02 ENCOUNTER — Inpatient Hospital Stay: Payer: Medicare Other

## 2015-09-02 ENCOUNTER — Inpatient Hospital Stay: Payer: Medicare Other | Admitting: Anesthesiology

## 2015-09-02 ENCOUNTER — Encounter
Admission: EM | Disposition: A | Discharge status: Home / Self Care | Payer: Self-pay | Source: Home / Self Care | Attending: Internal Medicine

## 2015-09-02 ENCOUNTER — Encounter: Payer: Self-pay | Admitting: Anesthesiology

## 2015-09-02 HISTORY — PX: INTRAOPERATIVE CHOLANGIOGRAM: SHX5230

## 2015-09-02 HISTORY — PX: CHOLECYSTECTOMY: SHX55

## 2015-09-02 LAB — SURGICAL PCR SCREEN
MRSA, PCR: NEGATIVE
Staphylococcus aureus: NEGATIVE

## 2015-09-02 SURGERY — LAPAROSCOPIC CHOLECYSTECTOMY
Anesthesia: General

## 2015-09-02 MED ORDER — ROCURONIUM BROMIDE 100 MG/10ML IV SOLN
INTRAVENOUS | Status: DC | PRN
  Administered 2015-09-02: 5 mg via INTRAVENOUS
  Administered 2015-09-02 (×2): 15 mg via INTRAVENOUS

## 2015-09-02 MED ORDER — PHENYLEPHRINE HCL 10 MG/ML IJ SOLN
INTRAMUSCULAR | Status: DC | PRN
  Administered 2015-09-02: 50 ug via INTRAVENOUS

## 2015-09-02 MED ORDER — DEXTROSE-NACL 5-0.2 % IV SOLN
INTRAVENOUS | Status: DC
  Administered 2015-09-02 – 2015-09-03 (×2): via INTRAVENOUS

## 2015-09-02 MED ORDER — SUCCINYLCHOLINE CHLORIDE 20 MG/ML IJ SOLN
INTRAMUSCULAR | Status: DC | PRN
  Administered 2015-09-02: 100 mg via INTRAVENOUS

## 2015-09-02 MED ORDER — GLYCOPYRROLATE 0.2 MG/ML IJ SOLN
INTRAMUSCULAR | Status: DC | PRN
  Administered 2015-09-02: 0.1 mg via INTRAVENOUS
  Administered 2015-09-02: 0.4 mg via INTRAVENOUS

## 2015-09-02 MED ORDER — LIDOCAINE HCL (CARDIAC) 20 MG/ML IV SOLN
INTRAVENOUS | Status: DC | PRN
  Administered 2015-09-02: 30 mg via INTRAVENOUS

## 2015-09-02 MED ORDER — PROPOFOL 10 MG/ML IV BOLUS
INTRAVENOUS | Status: DC | PRN
  Administered 2015-09-02: 100 mg via INTRAVENOUS

## 2015-09-02 MED ORDER — LABETALOL HCL 5 MG/ML IV SOLN
INTRAVENOUS | Status: DC | PRN
  Administered 2015-09-02: 5 mg via INTRAVENOUS

## 2015-09-02 MED ORDER — FENTANYL CITRATE (PF) 100 MCG/2ML IJ SOLN
INTRAMUSCULAR | Status: DC | PRN
  Administered 2015-09-02: 100 ug via INTRAVENOUS
  Administered 2015-09-02 (×2): 50 ug via INTRAVENOUS

## 2015-09-02 MED ORDER — MIDAZOLAM HCL 2 MG/2ML IJ SOLN
INTRAMUSCULAR | Status: DC | PRN
  Administered 2015-09-02 (×2): 1 mg via INTRAVENOUS

## 2015-09-02 MED ORDER — ACETAMINOPHEN 10 MG/ML IV SOLN
INTRAVENOUS | Status: DC | PRN
  Administered 2015-09-02: 1000 mg via INTRAVENOUS

## 2015-09-02 MED ORDER — FENTANYL CITRATE (PF) 100 MCG/2ML IJ SOLN
25.0000 ug | INTRAMUSCULAR | Status: AC | PRN
  Administered 2015-09-02 (×6): 25 ug via INTRAVENOUS

## 2015-09-02 MED ORDER — BUPIVACAINE-EPINEPHRINE (PF) 0.5% -1:200000 IJ SOLN
INTRAMUSCULAR | Status: DC | PRN
  Administered 2015-09-02: 12 mL via PERINEURAL

## 2015-09-02 MED ORDER — SODIUM CHLORIDE 0.9 % IV SOLN
INTRAVENOUS | Status: DC | PRN
  Administered 2015-09-02: 300 mL via INTRAMUSCULAR

## 2015-09-02 MED ORDER — IOTHALAMATE MEGLUMINE 60 % INJ SOLN
INTRAMUSCULAR | Status: DC | PRN
  Administered 2015-09-02: 15 mL

## 2015-09-02 MED ORDER — LACTATED RINGERS IV SOLN
INTRAVENOUS | Status: DC | PRN
  Administered 2015-09-02 (×2): via INTRAVENOUS

## 2015-09-02 MED ORDER — NEOSTIGMINE METHYLSULFATE 10 MG/10ML IV SOLN
INTRAVENOUS | Status: DC | PRN
  Administered 2015-09-02: 2 mg via INTRAVENOUS

## 2015-09-02 MED ORDER — EPHEDRINE SULFATE 50 MG/ML IJ SOLN
INTRAMUSCULAR | Status: DC | PRN
  Administered 2015-09-02 (×2): 5 mg via INTRAVENOUS
  Administered 2015-09-02: 10 mg via INTRAVENOUS

## 2015-09-02 MED ORDER — DEXAMETHASONE SODIUM PHOSPHATE 4 MG/ML IJ SOLN: 8.0000 mg | Freq: Once | INTRAMUSCULAR | Status: DC | PRN

## 2015-09-02 SURGICAL SUPPLY — 35 items
APPLIER CLIP ROT 10 11.4 M/L (STAPLE) ×4
CANISTER SUCT 1200ML W/VALVE (MISCELLANEOUS) ×4 IMPLANT
CANNULA DILATOR 10 W/SLV (CANNULA) ×3 IMPLANT
CANNULA DILATOR 10MM W/SLV (CANNULA) ×1
CATH REDDICK CHOLANGI 4FR 50CM (CATHETERS) ×4 IMPLANT
CHLORAPREP W/TINT 26ML (MISCELLANEOUS) ×4 IMPLANT
CLIP APPLIE ROT 10 11.4 M/L (STAPLE) ×2 IMPLANT
CLOSURE WOUND 1/2 X4 (GAUZE/BANDAGES/DRESSINGS) ×1
DRAPE SHEET LG 3/4 BI-LAMINATE (DRAPES) ×4 IMPLANT
GAUZE SPONGE 4X4 12PLY STRL (GAUZE/BANDAGES/DRESSINGS) ×4 IMPLANT
GLOVE BIO SURGEON STRL SZ7.5 (GLOVE) ×28 IMPLANT
GOWN STRL REUS W/ TWL LRG LVL3 (GOWN DISPOSABLE) ×8 IMPLANT
GOWN STRL REUS W/TWL LRG LVL3 (GOWN DISPOSABLE) ×8
IRRIGATION STRYKERFLOW (MISCELLANEOUS) ×2 IMPLANT
IRRIGATOR STRYKERFLOW (MISCELLANEOUS) ×4
IV NS 1000ML (IV SOLUTION) ×2
IV NS 1000ML BAXH (IV SOLUTION) ×2 IMPLANT
KIT RM TURNOVER STRD PROC AR (KITS) ×4 IMPLANT
LABEL OR SOLS (LABEL) ×4 IMPLANT
NEEDLE FILTER BLUNT 18X 1/2SAF (NEEDLE) ×2
NEEDLE FILTER BLUNT 18X1 1/2 (NEEDLE) ×2 IMPLANT
NS IRRIG 500ML POUR BTL (IV SOLUTION) ×4 IMPLANT
PACK LAP CHOLECYSTECTOMY (MISCELLANEOUS) ×4 IMPLANT
PAD GROUND ADULT SPLIT (MISCELLANEOUS) ×4 IMPLANT
SCISSORS METZENBAUM CVD 33 (INSTRUMENTS) ×4 IMPLANT
SEAL FOR SCOPE WARMER C3101 (MISCELLANEOUS) ×4 IMPLANT
SLEEVE ENDOPATH XCEL 5M (ENDOMECHANICALS) ×4 IMPLANT
STRIP CLOSURE SKIN 1/2X4 (GAUZE/BANDAGES/DRESSINGS) ×3 IMPLANT
SUT CHROMIC 5 0 RB 1 27 (SUTURE) ×8 IMPLANT
SUT VIC AB 0 CT2 27 (SUTURE) IMPLANT
SYR 3ML LL SCALE MARK (SYRINGE) ×4 IMPLANT
TROCAR XCEL NON-BLD 11X100MML (ENDOMECHANICALS) ×4 IMPLANT
TROCAR XCEL NON-BLD 5MMX100MML (ENDOMECHANICALS) ×4 IMPLANT
TUBING INSUFFLATOR HI FLOW (MISCELLANEOUS) ×4 IMPLANT
WATER STERILE IRR 1000ML POUR (IV SOLUTION) ×4 IMPLANT

## 2015-09-02 NOTE — Anesthesia Preprocedure Evaluation (Signed)
Anesthesia Evaluation  Patient identified by MRN, date of birth, ID band Patient awake    Reviewed: Allergy & Precautions, NPO status , Patient's Chart, lab work & pertinent test results  Airway Mallampati: I  TM Distance: >3 FB Neck ROM: Limited    Dental  (+) Teeth Intact   Pulmonary former smoker,    Pulmonary exam normal        Cardiovascular hypertension, Pt. on medications Normal cardiovascular exam  MI in the 90's and has done well since.   Neuro/Psych    GI/Hepatic GERD  Medicated and Controlled,  Endo/Other    Renal/GU Renal InsufficiencyRenal disease     Musculoskeletal   Abdominal (+)  Abdomen: soft.    Peds  Hematology   Anesthesia Other Findings   Reproductive/Obstetrics                             Anesthesia Physical Anesthesia Plan  ASA: III and emergent  Anesthesia Plan: General   Post-op Pain Management:    Induction: Intravenous  Airway Management Planned: Oral ETT  Additional Equipment:   Intra-op Plan:   Post-operative Plan: Extubation in OR  Informed Consent: I have reviewed the patients History and Physical, chart, labs and discussed the procedure including the risks, benefits and alternatives for the proposed anesthesia with the patient or authorized representative who has indicated his/her understanding and acceptance.     Plan Discussed with: CRNA  Anesthesia Plan Comments:         Anesthesia Quick Evaluation

## 2015-09-02 NOTE — Anesthesia Postprocedure Evaluation (Signed)
  Anesthesia Post-op Note  Patient: Trevor Deleon.  Procedure(s) Performed: Procedure(s): LAPAROSCOPIC CHOLECYSTECTOMY (N/A) INTRAOPERATIVE CHOLANGIOGRAM  Anesthesia type:General  Patient location: PACU  Post pain: Pain level controlled  Post assessment: Post-op Vital signs reviewed, Patient's Cardiovascular Status Stable, Respiratory Function Stable, Patent Airway and No signs of Nausea or vomiting  Post vital signs: Reviewed and stable  Last Vitals:  Filed Vitals:   09/02/15 0952  BP:   Pulse:   Temp: 36.3 C  Resp:     Level of consciousness: awake, alert  and patient cooperative  Complications: No apparent anesthesia complications

## 2015-09-02 NOTE — Progress Notes (Signed)
[place] [facility] [place] NAME: Trevor Deleon    MR#:  [id number]  DATE OF BIRTH:  [birth year]  SUBJECTIVE:  CHIEF COMPLAINT:   Chief Complaint  Patient presents with  . Chest Pain    Pt. presents to ed with family for chest pain that started around 10pm last night.   Abdominal pain due to surgery. Status post surgery this morning. REVIEW OF SYSTEMS:  CONSTITUTIONAL: No fever, fatigue or weakness.  EYES: No blurred or double vision.  EARS, NOSE, AND THROAT: No tinnitus or ear pain.  RESPIRATORY: No cough, shortness of breath, wheezing or hemoptysis.  CARDIOVASCULAR: No chest pain, orthopnea, edema.  GASTROINTESTINAL: No nausea, vomiting, diarrhea, but has abdominal pain.  GENITOURINARY: No dysuria, hematuria.  ENDOCRINE: No polyuria, nocturia,  HEMATOLOGY: No anemia, easy bruising or bleeding SKIN: No rash or lesion. MUSCULOSKELETAL: No joint pain or arthritis.   NEUROLOGIC: No tingling, numbness, weakness.  PSYCHIATRY: No anxiety or depression.   DRUG ALLERGIES:  No Known Allergies  VITALS:  Blood pressure 140/60, pulse 59, temperature 97.7 F (36.5 C), temperature source Oral, resp. rate 17, height 5\' 11"  (1.803 m), weight 81.693 kg (180 lb 1.6 oz), SpO2 99 %.  PHYSICAL EXAMINATION:  GENERAL:  81 y.o.-year-old patient lying in the bed with no acute distress.  EYES: Pupils equal, round, reactive to light and accommodation. No scleral icterus. Extraocular muscles intact.  HEENT: Head atraumatic, normocephalic. Oropharynx and nasopharynx clear. Moist oral mucosa. NECK:  Supple, no jugular venous distention. No thyroid enlargement, no tenderness.  LUNGS: Normal breath sounds bilaterally, no wheezing, rales,rhonchi or crepitation. No use of accessory muscles of respiration.  CARDIOVASCULAR: S1, S2 normal. No murmurs, rubs, or gallops.  ABDOMEN: Soft, has tenderness, nondistended. Bowel sounds weak. No organomegaly or mass.   EXTREMITIES: No pedal edema, cyanosis, or clubbing.  NEUROLOGIC: Cranial nerves II through XII are intact. Muscle strength 5/5 in all extremities. Sensation intact. Gait not checked.  PSYCHIATRIC: The patient is alert and oriented x 3.  SKIN: No obvious rash, lesion, or ulcer.    LABORATORY PANEL:   CBC  Recent Labs Lab 08/30/15 0607  WBC 4.2  HGB 12.8*  HCT 36.9*  PLT 73*   ------------------------------------------------------------------------------------------------------------------  Chemistries   Recent Labs Lab 08/30/15 0607 08/31/15 0629  NA 142 141  K 3.8 4.1  CL 110 112*  CO2 25 23  GLUCOSE 97 127*  BUN 21* 17  CREATININE 1.30* 1.17  CALCIUM 8.6* 8.6*  MG 1.8  --   AST  --  25  ALT  --  34  ALKPHOS  --  71  BILITOT  --  1.2   ------------------------------------------------------------------------------------------------------------------  Cardiac Enzymes  Recent Labs Lab 08/29/15 0549  TROPONINI <0.03   ------------------------------------------------------------------------------------------------------------------  RADIOLOGY:  Dg Cholangiogram Operative  09/02/2015  CLINICAL DATA:  Laparoscopic cholecystectomy. EXAM: INTRAOPERATIVE CHOLANGIOGRAM FLUOROSCOPY TIME:  6 seconds COMPARISON:  Abdominal ultrasound - 08/04/2015 FINDINGS: Intraoperative cholangiographic images of the right upper abdominal quadrant during laparoscopic cholecystectomy are provided for review. Surgical clips overlie the expected location of the gallbladder fossa. Contrast injection demonstrates selective cannulation of the central aspect of the cystic duct. There is passage of contrast through the central aspect of the cystic duct with filling of a non dilated common bile duct. There is passage of contrast though the CBD and into the descending portion of the duodenum. There is minimal reflux of injected contrast into the common hepatic duct and central aspect of the  non  dilated intrahepatic biliary system. There are persistent ill-defined nonocclusive filling defects in the distal aspect of the CBD worrisome for choledocholithiasis. IMPRESSION: Persistent ill-defined nonocclusive filling defects within the distal aspect of the CBD worrisome for choledocholithiasis. Correlation with the operative report is recommended. Electronically Signed   By: Sandi Mariscal M.D.   On: 09/02/2015 10:40    EKG:   Orders placed or performed during the hospital encounter of 08/29/15  . EKG 12-Lead  . EKG 12-Lead    ASSESSMENT AND PLAN:   Acute pancreatitis. Lipase is trending down to normal range. He tolerated heart healthy diet.  Chronic cholecystitis cholelithiasis, biliary pancreatitis, choledocholithiasis. Status post Laparoscopic cholecystectomy with cholangiogram this morning. Dr. Tamala Julian suggested start a clear liquid diet and the possible discharge tomorrow.   Abnormal liver function test. Liver function test is normal. Hypokalemia. Improved. Thrombocytopenia. Chronic and stable. CAD. Hold aspirin for surgery. CKD stage 3. Stable HTN. Controlled, continue hypertension medication. HLP. Continue statin.   All the records are reviewed and case discussed with Care Management/Social Workerr. Management plans discussed with the patient, his wife and daughter and they are in agreement.  CODE STATUS: Full code  TOTAL TIME TAKING CARE OF THIS PATIENT: 35 minutes.   POSSIBLE D/C IN 3 DAYS, DEPENDING ON CLINICAL CONDITION.   Demetrios Loll M.D on 09/02/2015 at 2:26 PM  Between 7am to 6pm - Pager - [phone]  After 6pm go to [email or web address] - password [email or web address] [facility]  Office  [phone]  CC: Primary care physician; Ezequiel Kayser, MD

## 2015-09-02 NOTE — Care Management Important Message (Signed)
Important Message  Patient Details  Name: Trevor Deleon. MRN: [id number] Date of Birth: 04/09/1932   Medicare Important Message Given:  Yes-third notification given    Alvie Heidelberg, RN 09/02/2015, 11:15 AM

## 2015-09-02 NOTE — Transfer of Care (Signed)
Immediate Anesthesia Transfer of Care Note  Patient: Trevor Deleon.  Procedure(s) Performed: Procedure(s): LAPAROSCOPIC CHOLECYSTECTOMY (N/A) INTRAOPERATIVE CHOLANGIOGRAM  Patient Location: PACU  Anesthesia Type:General  Level of Consciousness: awake and sedated  Airway & Oxygen Therapy: Patient Spontanous Breathing and Patient connected to face mask oxygen  Post-op Assessment: Report given to RN and Post -op Vital signs reviewed and stable  Post vital signs: Reviewed and stable  Last Vitals:  Filed Vitals:   09/02/15 0529  BP: 160/58  Pulse: 46  Temp: 36.7 C  Resp: 16    Complications: No apparent anesthesia complications

## 2015-09-02 NOTE — Progress Notes (Signed)
He reports no new pains overnight.  Abdomen soft flat and nontender.  I discussed plan is for laparoscopic cholecystectomy

## 2015-09-02 NOTE — Progress Notes (Signed)
He reports moderate incisional pain after surgery.   He also has some pain in his right shoulder. His mouth is dry.  He has no nausea.  On examination his dressings are dry.  There is minimal abdominal tenderness.  Diagnosis chronic cholecystitis cholelithiasis, choledocholithiasis, biliary pancreatitis  I discuss with him the intraoperative cholangiogram findings of common duct stone.  I am consulting  Dr. Candace Cruise who will see tomorrow morning to consider the possible role of ERCP.  To have comprehensive metabolic panel in the morning.  I have ordered clear liquid diet but patient will avoid liquids in the morning until after seen by Dr. Candace Cruise

## 2015-09-02 NOTE — Op Note (Signed)
OPERATIVE REPORT  PREOPERATIVE DIAGNOSIS:  Chronic cholecystitis cholelithiasis, biliary pancreatitis  POSTOPERATIVE DIAGNOSIS: Chronic cholecystitis cholelithiasis, biliary pancreatitis, choledocholithiasis  PROCEDURE: Laparoscopic cholecystectomy with cholangiogram  ANESTHESIA: General  SURGEON: Rochel Brome M.D.  INDICATIONS: She has a history of epigastric pains and transient elevation of bilirubin and recent elevated lipase. He had ultrasound findings of gallstones.    With the patient on the operating table in the supine position under general endotracheal anesthesia the abdomen was prepared with ChloraPrep solution and draped in a sterile manner. A short incision was made in the inferior aspect of the umbilicus and carried down to the deep fascia which was grasped with a laryngeal hook. The Veress needle was inserted into the peritoneal cavity aspirated and irrigated with a saline solution. The peritoneal cavity was insufflated with carbon dioxide. The Veress needle was removed. The 10 mm cannula was inserted. The 10 mm 0 laparoscope was inserted to view the peritoneal cavity. The scope was posterior to the omentum and with some manipulation was brought anterior to the omentum.  Another incision was made in the epigastrium slightly to the right of the midline to introduce an 11 mm cannula. 2 incisions were made in the lateral aspect of the right upper quadrant to introduce 2   5 mm cannulas. The liver was smooth. The gallbladder appeared to have some edema The gallbladder was retracted towards the right shoulder.   The gallbladder neck was retracted inferiorly and laterally.  The porta hepatis was identified. The gallbladder was mobilized with incision of the visceral peritoneum. The cystic duct was dissected free from surrounding structures. The cystic artery was dissected free from surrounding structures. A critical view of safety was demonstrated  An Endo Clip was placed across the  cystic duct adjacent to the gallbladder neck. An incision was made in the cystic duct to introduce a Reddick catheter. The cholangiogram was done with injection of half-strength Conray 60 dye. This demonstrated the bile ducts and flow of dye into the duodenum. There was a lucency in the distal common bile duct which could be a stone or sludge.. The cholangiogram otherwise appeared normal. The Reddick catheter was removed. The cystic duct was doubly ligated with endoclips and divided. The cystic artery was controlled with double endoclips and divided. Another branch of the cystic artery was controlled with endoclips and divided. The gallbladder was dissected free from the liver with use of hook and cautery and blunt dissection. Bleeding was minimal and hemostasis was intact. The gallbladder was delivered up through the infraumbilical incision opened and suctioned. The gallbladder did contain sludge. The gallbladder and some of the sludge was submitted in formalin for routine pathology. Further inspection revealed there was a small amount of sludge laying on the omentum which was removed with the stone scoop and suctioned. The gallbladder bed was further inspected and irrigated and hemostasis was intact. The cannulas were removed. There was some bleeding from the epigastric port site. The umbilical cannula was reinserted and examined and found some bleeding. The epigastric port site was infiltrated with half percent Sensorcaine with epinephrine. Some blood was aspirated. Hemostasis subsequently appeared to be intact. The cannulas were removed. The carbon dioxide was allowed to escape from the peritoneal cavity. The skin incisions were closed with interrupted 5-0 chromic subcuticular suture benzoin and Steri-Strips. Dressings were applied with paper tape. The patient tolerated the procedure satisfactorily and was prepared for transfer to the recovery room  [facility].D.

## 2015-09-02 NOTE — Anesthesia Procedure Notes (Signed)
Procedure Name: Intubation Performed by: Vaughan Sine Pre-anesthesia Checklist: Patient identified, Emergency Drugs available, Suction available, Patient being monitored and Timeout performed Patient Re-evaluated:Patient Re-evaluated prior to inductionOxygen Delivery Method: Circle system utilized Preoxygenation: Pre-oxygenation with 100% oxygen Intubation Type: IV induction Ventilation: Mask ventilation without difficulty Laryngoscope Size: 3 and Miller Grade View: Grade I Tube type: Oral Tube size: 7.5 mm Number of attempts: 1 Airway Equipment and Method: Stylet Placement Confirmation: ETT inserted through vocal cords under direct vision,  positive ETCO2,  CO2 detector and breath sounds checked- equal and bilateral Secured at: 22 cm Tube secured with: Tape Dental Injury: Teeth and Oropharynx as per pre-operative assessment

## 2015-09-03 ENCOUNTER — Inpatient Hospital Stay: Payer: Medicare Other

## 2015-09-03 ENCOUNTER — Inpatient Hospital Stay: Payer: Medicare Other | Admitting: Anesthesiology

## 2015-09-03 ENCOUNTER — Encounter
Admission: EM | Disposition: A | Discharge status: Home / Self Care | Payer: Self-pay | Source: Home / Self Care | Attending: Internal Medicine

## 2015-09-03 ENCOUNTER — Encounter: Payer: Self-pay | Admitting: Gastroenterology

## 2015-09-03 ENCOUNTER — Other Ambulatory Visit: Payer: Medicare Other

## 2015-09-03 HISTORY — PX: ERCP: SHX5425

## 2015-09-03 LAB — COMPREHENSIVE METABOLIC PANEL
ALBUMIN: 3.3 g/dL — AB (ref 3.5–5.0)
ALK PHOS: 54 U/L (ref 38–126)
ALT: 27 U/L (ref 17–63)
ANION GAP: 6 (ref 5–15)
AST: 27 U/L (ref 15–41)
BILIRUBIN TOTAL: 1.5 mg/dL — AB (ref 0.3–1.2)
BUN: 15 mg/dL (ref 6–20)
CALCIUM: 8.3 mg/dL — AB (ref 8.9–10.3)
CO2: 25 mmol/L (ref 22–32)
CREATININE: 1.17 mg/dL (ref 0.61–1.24)
Chloride: 102 mmol/L (ref 101–111)
GFR calc Af Amer: >60 mL/min (ref >60)
GFR calc non Af Amer: 57 mL/min — ABNORMAL LOW (ref >60)
GLUCOSE: 155 mg/dL — AB (ref 65–99)
Potassium: 3.9 mmol/L (ref 3.5–5.1)
Sodium: 133 mmol/L — ABNORMAL LOW (ref 135–145)
TOTAL PROTEIN: 6.1 g/dL — AB (ref 6.5–8.1)

## 2015-09-03 LAB — CBC WITH DIFFERENTIAL/PLATELET
Basophils Absolute: 0 10*3/uL (ref 0–0.1)
Basophils Relative: 0 %
Eosinophils Absolute: 0 10*3/uL (ref 0–0.7)
Eosinophils Relative: 1 %
HCT: 36.8 % — ABNORMAL LOW (ref 40.0–52.0)
Hemoglobin: 12.7 g/dL — ABNORMAL LOW (ref 13.0–18.0)
Lymphocytes Relative: 12 %
Lymphs Abs: 0.8 10*3/uL — ABNORMAL LOW (ref 1.0–3.6)
MCH: 30.5 pg (ref 26.0–34.0)
MCHC: 34.6 g/dL (ref 32.0–36.0)
MCV: 88.2 fL (ref 80.0–100.0)
Monocytes Absolute: 0.4 10*3/uL (ref 0.2–1.0)
Monocytes Relative: 6 %
Neutro Abs: 5.4 10*3/uL (ref 1.4–6.5)
Neutrophils Relative %: 81 %
Platelets: 86 10*3/uL — ABNORMAL LOW (ref 150–440)
RBC: 4.17 MIL/uL — ABNORMAL LOW (ref 4.40–5.90)
RDW: 13.9 % (ref 11.5–14.5)
WBC: 6.6 10*3/uL (ref 3.8–10.6)

## 2015-09-03 LAB — MAGNESIUM: MAGNESIUM: 1.5 mg/dL — AB (ref 1.7–2.4)

## 2015-09-03 LAB — SURGICAL PATHOLOGY

## 2015-09-03 SURGERY — ERCP, WITH INTERVENTION IF INDICATED
Anesthesia: General | Laterality: Left

## 2015-09-03 MED ORDER — FENTANYL CITRATE (PF) 100 MCG/2ML IJ SOLN
INTRAMUSCULAR | Status: DC | PRN
  Administered 2015-09-03: 50 ug via INTRAVENOUS

## 2015-09-03 MED ORDER — SODIUM CHLORIDE 0.9 % IV SOLN
1.5000 g | Freq: Once | INTRAVENOUS | Status: AC
  Administered 2015-09-03: 1.5 g via INTRAVENOUS
  Filled 2015-09-03 (×2): qty 1.5

## 2015-09-03 MED ORDER — PHENYLEPHRINE HCL 10 MG/ML IJ SOLN
INTRAMUSCULAR | Status: DC | PRN
  Administered 2015-09-03 (×3): 100 ug via INTRAVENOUS

## 2015-09-03 MED ORDER — SODIUM CHLORIDE 0.9 % IV SOLN: INTRAVENOUS | Status: DC

## 2015-09-03 MED ORDER — IPRATROPIUM-ALBUTEROL 0.5-2.5 (3) MG/3ML IN SOLN
RESPIRATORY_TRACT | Status: AC
  Administered 2015-09-03: 10:00:00
  Filled 2015-09-03: qty 3

## 2015-09-03 MED ORDER — MIDAZOLAM HCL 2 MG/2ML IJ SOLN
INTRAMUSCULAR | Status: DC | PRN
  Administered 2015-09-03: 2 mg via INTRAVENOUS

## 2015-09-03 MED ORDER — SODIUM CHLORIDE 0.9 % IV SOLN
INTRAVENOUS | Status: DC
  Administered 2015-09-03 (×2): via INTRAVENOUS

## 2015-09-03 MED ORDER — IPRATROPIUM-ALBUTEROL 0.5-2.5 (3) MG/3ML IN SOLN: 3.0000 mL | Freq: Once | RESPIRATORY_TRACT | Status: DC

## 2015-09-03 MED ORDER — SODIUM CHLORIDE 0.9 % IV SOLN
INTRAVENOUS | Status: DC
  Administered 2015-09-04: 06:00:00 via INTRAVENOUS

## 2015-09-03 MED ORDER — PROPOFOL 500 MG/50ML IV EMUL
INTRAVENOUS | Status: DC | PRN
  Administered 2015-09-03: 120 ug/kg/min via INTRAVENOUS

## 2015-09-03 NOTE — Transfer of Care (Signed)
Immediate Anesthesia Transfer of Care Note  Patient: Trevor Deleon.  Procedure(s) Performed: Procedure(s): ENDOSCOPIC RETROGRADE CHOLANGIOPANCREATOGRAPHY (ERCP) (Left)  Patient Location: PACU and Endoscopy Unit  Anesthesia Type:General  Level of Consciousness: sedated and responds to stimulation  Airway & Oxygen Therapy: Patient Spontanous Breathing and Patient connected to nasal cannula oxygen  Post-op Assessment: Report given to RN and Post -op Vital signs reviewed and stable  Post vital signs: Reviewed and stable  Last Vitals:  Filed Vitals:   09/03/15 1121  BP: 98/61  Pulse: 69  Temp:   Resp: 10    Complications: No apparent anesthesia complications

## 2015-09-03 NOTE — Anesthesia Postprocedure Evaluation (Signed)
  Anesthesia Post-op Note  Patient: Trevor Deleon.  Procedure(s) Performed: Procedure(s): ENDOSCOPIC RETROGRADE CHOLANGIOPANCREATOGRAPHY (ERCP) (Left)  Anesthesia type:General  Patient location: PACU  Post pain: Pain level controlled  Post assessment: Post-op Vital signs reviewed, Patient's Cardiovascular Status Stable, Respiratory Function Stable, Patent Airway and No signs of Nausea or vomiting  Post vital signs: Reviewed and stable  Last Vitals:  Filed Vitals:   09/03/15 1225  BP: 126/56  Pulse: 74  Temp: 37 C  Resp: 18    Level of consciousness: awake, alert  and patient cooperative  Complications: No apparent anesthesia complications

## 2015-09-03 NOTE — Anesthesia Preprocedure Evaluation (Addendum)
Anesthesia Evaluation  Patient identified by MRN, date of birth, ID band Patient awake    Reviewed: Allergy & Precautions, H&P , NPO status , Patient's Chart, lab work & pertinent test results  History of Anesthesia Complications Negative for: history of anesthetic complications  Airway Mallampati: III  TM Distance: >3 FB Neck ROM: limited    Dental  (+) Poor Dentition   Pulmonary neg pulmonary ROS, neg shortness of breath, former smoker,    Pulmonary exam normal breath sounds clear to auscultation       Cardiovascular Exercise Tolerance: Good hypertension, (-) angina+ CAD and + Cardiac Stents  Normal cardiovascular exam Rhythm:regular Rate:Normal     Neuro/Psych negative neurological ROS  negative psych ROS   GI/Hepatic Neg liver ROS, GERD  Controlled,  Endo/Other  negative endocrine ROS  Renal/GU CRFRenal disease  negative genitourinary   Musculoskeletal   Abdominal   Peds  Hematology negative hematology ROS (+)   Anesthesia Other Findings Past Medical History:   GERD (gastroesophageal reflux disease)                       Hypertension                                                 Hernia, femoral                                              CKD (chronic kidney disease) stage 3, GFR 30-5*              BPH (benign prostatic hyperplasia)                           Hyperlipidemia                                              Past Surgical History:   JOINT REPLACEMENT                                             CARDIAC SURGERY                                               TONSILLECTOMY                                                 APPENDECTOMY                                                  CHOLECYSTECTOMY  N/A 09/02/2015     Comment:Procedure: LAPAROSCOPIC CHOLECYSTECTOMY;                Surgeon: Leonie Green, MD;  Location:               [facility] ORS;  Service: General;   Laterality: N/A;   INTRAOPERATIVE CHOLANGIOGRAM                     09/02/2015     Comment:Procedure: INTRAOPERATIVE CHOLANGIOGRAM;                Surgeon: Leonie Green, MD;  Location:               [facility] ORS;  Service: General;;  BMI    Body Mass Index   25.13 kg/m 2      Reproductive/Obstetrics negative OB ROS                             Anesthesia Physical Anesthesia Plan  ASA: III  Anesthesia Plan: General   Post-op Pain Management:    Induction:   Airway Management Planned:   Additional Equipment:   Intra-op Plan:   Post-operative Plan:   Informed Consent: I have reviewed the patients History and Physical, chart, labs and discussed the procedure including the risks, benefits and alternatives for the proposed anesthesia with the patient or authorized representative who has indicated his/her understanding and acceptance.   Dental Advisory Given  Plan Discussed with: Anesthesiologist, CRNA and Surgeon  Anesthesia Plan Comments:         Anesthesia Quick Evaluation

## 2015-09-03 NOTE — Progress Notes (Signed)
He had ERCP and insertion of pancreatic stent and sphincterotomy with choledocholithotomy.  He has moderate abdominal discomfort.  Mild abdominal tenderness.  Dressings are dry.  To take clear liquids today.  Encourage ambulation in the hallway.  Will likely need to stay overnight.  Can possibly be discharged tomorrow.  Can follow up in my office in 2 weeks

## 2015-09-03 NOTE — Op Note (Signed)
Pancreatic stent placed to get access to CBD. Sphincterotomy done. 1 stone extracted. Keep on clear liquid diet. KUB in 2 weeks. If stent still in place, then EGD as outpt to remove stent. thanks

## 2015-09-03 NOTE — Progress Notes (Signed)
He has moderate epigastric pain this morning.  His right shoulder feels better.  On examination is dressings are dry.  Abdomen is soft with no significant tenderness.  Bilirubin is elevated today at 1.5.  Diagnosis chronic cholecystitis cholelithiasis and choledocholithiasis and biliary pancreatitis  Plan is for ERCP today.  Will not likely need to stay overnight for another day of observation

## 2015-09-03 NOTE — Progress Notes (Addendum)
[facility] NAME: Allister Lessley    MR#:  [id number]  DATE OF BIRTH:  10-27-1932  SUBJECTIVE:  CHIEF COMPLAINT:   Chief Complaint  Patient presents with  . Chest Pain    Pt. presents to ed with family for chest pain that started around 10pm last night.   Feels abdominal sore, no appetite. Status post ERCP today.  REVIEW OF SYSTEMS:  CONSTITUTIONAL: No fever, has weakness.  EYES: No blurred or double vision.  EARS, NOSE, AND THROAT: No tinnitus or ear pain.  RESPIRATORY: No cough, shortness of breath, wheezing or hemoptysis.  CARDIOVASCULAR: No chest pain, orthopnea, edema.  GASTROINTESTINAL: No nausea, vomiting, diarrhea, but has abdominal pain.  GENITOURINARY: No dysuria, hematuria.  ENDOCRINE: No polyuria, nocturia,  HEMATOLOGY: No anemia, easy bruising or bleeding SKIN: No rash or lesion. MUSCULOSKELETAL: No joint pain or arthritis.   NEUROLOGIC: No tingling, numbness, weakness.  PSYCHIATRY: No anxiety or depression.   DRUG ALLERGIES:  No Known Allergies  VITALS:  Blood pressure 126/56, pulse 74, temperature 98.6 F (37 C), temperature source Oral, resp. rate 18, height 5\' 11"  (1.803 m), weight 81.693 kg (180 lb 1.6 oz), SpO2 96 %.  PHYSICAL EXAMINATION:  GENERAL:  81 y.o.-year-old patient lying in the bed with no acute distress.  EYES: Pupils equal, round, reactive to light and accommodation. No scleral icterus. Extraocular muscles intact.  HEENT: Head atraumatic, normocephalic. Oropharynx and nasopharynx clear. Moist oral mucosa. NECK:  Supple, no jugular venous distention. No thyroid enlargement, no tenderness.  LUNGS: Normal breath sounds bilaterally, no wheezing, rales,rhonchi or crepitation. No use of accessory muscles of respiration.  CARDIOVASCULAR: S1, S2 normal. No murmurs, rubs, or gallops.  ABDOMEN: Soft, has tenderness, nondistended. Bowel sounds weak. No organomegaly or mass.  EXTREMITIES: No  pedal edema, cyanosis, or clubbing.  NEUROLOGIC: Cranial nerves II through XII are intact. Muscle strength 4/5 in all extremities. Sensation intact. Gait not checked.  PSYCHIATRIC: The patient is alert and oriented x 3.  SKIN: No obvious rash, lesion, or ulcer.    LABORATORY PANEL:   CBC  Recent Labs Lab 09/03/15 0427  WBC 6.6  HGB 12.7*  HCT 36.8*  PLT 86*   ------------------------------------------------------------------------------------------------------------------  Chemistries   Recent Labs Lab 08/30/15 0607  09/03/15 0427  NA 142  < > 133*  K 3.8  < > 3.9  CL 110  < > 102  CO2 25  < > 25  GLUCOSE 97  < > 155*  BUN 21*  < > 15  CREATININE 1.30*  < > 1.17  CALCIUM 8.6*  < > 8.3*  MG 1.8  --   --   AST  --   < > 27  ALT  --   < > 27  ALKPHOS  --   < > 54  BILITOT  --   < > 1.5*  < > = values in this interval not displayed. ------------------------------------------------------------------------------------------------------------------  Cardiac Enzymes  Recent Labs Lab 08/29/15 0549  TROPONINI <0.03   ------------------------------------------------------------------------------------------------------------------  RADIOLOGY:  Dg Cholangiogram Operative  09/02/2015  CLINICAL DATA:  Laparoscopic cholecystectomy. EXAM: INTRAOPERATIVE CHOLANGIOGRAM FLUOROSCOPY TIME:  6 seconds COMPARISON:  Abdominal ultrasound - 08/04/2015 FINDINGS: Intraoperative cholangiographic images of the right upper abdominal quadrant during laparoscopic cholecystectomy are provided for review. Surgical clips overlie the expected location of the gallbladder fossa. Contrast injection demonstrates selective cannulation of the central aspect of the cystic duct. There is passage of contrast through the central  aspect of the cystic duct with filling of a non dilated common bile duct. There is passage of contrast though the CBD and into the descending portion of the duodenum. There is minimal  reflux of injected contrast into the common hepatic duct and central aspect of the non dilated intrahepatic biliary system. There are persistent ill-defined nonocclusive filling defects in the distal aspect of the CBD worrisome for choledocholithiasis. IMPRESSION: Persistent ill-defined nonocclusive filling defects within the distal aspect of the CBD worrisome for choledocholithiasis. Correlation with the operative report is recommended. Electronically Signed   By: Sandi Mariscal M.D.   On: 09/02/2015 10:40   Dg C-arm 1-60 Min-no Report  09/03/2015  CLINICAL DATA: stones C-ARM 1-60 MINUTES Fluoroscopy was utilized by the requesting physician.  No radiographic interpretation.    EKG:   Orders placed or performed during the hospital encounter of 08/29/15  . EKG 12-Lead  . EKG 12-Lead    ASSESSMENT AND PLAN:   Acute pancreatitis. Resolved. Lipase is trending down to normal range. He tolerated heart healthy diet.  Chronic cholecystitis cholelithiasis, biliary pancreatitis, choledocholithiasis. Status post Laparoscopic cholecystectomy with cholangiogram. S/p ERCP with stent placement this am. Per Dr. Tamala Julian, monitor overnight.  Abnormal liver function test. Liver function test is normal. Hypokalemia. Improved. Thrombocytopenia. Chronic and stable. CAD. Hold aspirin for surgery. CKD stage 3. Stable HTN. Controlled, continue hypertension medication. HLP. Continue statin. Hyponatremia. [place] iv, f/u BMP.  Discussed with Dr.Oh. All the records are reviewed and case discussed with Care Management/Social Workerr. Management plans discussed with the patient, his wife and daughter and they are in agreement.  CODE STATUS: Full code  TOTAL TIME TAKING CARE OF THIS PATIENT: 35 minutes.   POSSIBLE D/C tomorrow, DEPENDING ON CLINICAL CONDITION.   Demetrios Loll M.D on 09/03/2015 at 3:20 PM  Between 7am to 6pm - Pager - [phone]  After 6pm go to [email or web address] - password [email or web address] [facility]  Office  [phone]  CC: Primary care physician; Ezequiel Kayser, MD

## 2015-09-03 NOTE — Consult Note (Signed)
GI Inpatient Consult Note  Reason for Consult:   Attending Requesting Consult: CBD stone.  History of Present Illness: Trevor Deleon. is a 81 y.o. male who underwent lap cholecystectomy yesterday afternoon. Found to have a filling defect on intra op cholangiogram. This AM, pt is sore at surgical site. Repeat LFT shows rise in T.bili to 1.5.   Past Medical History:  Past Medical History  Diagnosis Date  . GERD (gastroesophageal reflux disease)   . Hypertension   . Hernia, femoral   . CKD (chronic kidney disease) stage 3, GFR 30-59 ml/min   . BPH (benign prostatic hyperplasia)   . Hyperlipidemia     Problem List: Patient Active Problem List   Diagnosis Date Noted  . Malnutrition of moderate degree 08/31/2015  . Acute pancreatitis 08/29/2015  . Cholelithiasis 08/29/2015    Past Surgical History: Past Surgical History  Procedure Laterality Date  . Joint replacement    . Cardiac surgery    . Tonsillectomy    . Appendectomy    . Cholecystectomy N/A 09/02/2015    Procedure: LAPAROSCOPIC CHOLECYSTECTOMY;  Surgeon: Leonie Green, MD;  Location: [facility] ORS;  Service: General;  Laterality: N/A;  . Intraoperative cholangiogram  09/02/2015    Procedure: INTRAOPERATIVE CHOLANGIOGRAM;  Surgeon: Leonie Green, MD;  Location: [facility] ORS;  Service: General;;    Allergies: No Known Allergies  Home Medications: Prescriptions prior to admission  Medication Sig Dispense Refill Last Dose  . acetaminophen (TYLENOL) 500 MG tablet Take 1,000 mg by mouth every 6 (six) hours as needed for mild pain or moderate pain (joint pain).   Past Week at Unknown time  . ALPRAZolam (XANAX) 0.25 MG tablet Take 1 tablet by mouth at bedtime as needed for sleep.   3 Past Month at Unknown time  . aspirin EC 81 MG tablet Take 81 mg by mouth daily.   08/28/2015 at Unknown time  . doxazosin (CARDURA) 4 MG tablet Take 4 mg by mouth daily.   08/28/2015 at Unknown time  . Multiple Vitamin (MULTIVITAMIN)  tablet Take 1 tablet by mouth daily.   08/28/2015 at Unknown time  . Omega-3 Fatty Acids (FISH OIL) 1000 MG CAPS Take 3,000 mg by mouth 2 (two) times daily.   08/28/2015 at Unknown time  . omeprazole (PRILOSEC) 20 MG capsule Take 40 mg by mouth daily.    08/28/2015 at Unknown time  . Polyethylene Glycol 3350 (MIRALAX PO) Take 17 g by mouth daily.    08/28/2015 at Unknown time  . potassium chloride (K-DUR) 10 MEQ tablet Take 30 mEq by mouth daily.   08/28/2015 at Unknown time  . Psyllium (METAMUCIL PO) Take 1 Dose by mouth daily.    08/28/2015 at Unknown time  . ranitidine (ZANTAC) 150 MG tablet Take 150 mg by mouth 2 (two) times daily.   08/28/2015 at Unknown time  . simvastatin (ZOCOR) 40 MG tablet Take 40 mg by mouth at bedtime.    08/28/2015 at Unknown time  . triamterene-hydrochlorothiazide (MAXZIDE-25) 37.5-25 MG per tablet Take 1 tablet by mouth daily.   08/28/2015 at Unknown time   Home medication reconciliation was completed with the patient.   Scheduled Inpatient Medications:   . doxazosin  4 mg Oral Q2000  . famotidine  20 mg Oral BID  . pantoprazole  40 mg Oral Daily  . potassium chloride  30 mEq Oral Daily  . simvastatin  40 mg Oral QHS    Continuous Inpatient Infusions:   . sodium chloride    .  dextrose 5 % and 0.2 % NaCl 50 mL/hr at 09/03/15 0624    PRN Inpatient Medications:  acetaminophen **OR** acetaminophen, albuterol, ALPRAZolam, dexamethasone, HYDROcodone-acetaminophen, morphine injection, ondansetron **OR** ondansetron (ZOFRAN) IV  Family History: family history includes Heart attack in his father; Hypertension in his mother; Prostate cancer in his father; Stroke in his mother.  The patient's family history is negative for inflammatory bowel disorders, GI malignancy, or solid organ transplantation.  Social History:   reports that he has quit smoking. He does not have any smokeless tobacco history on file. He reports that he does not drink alcohol or use illicit drugs.  The patient denies ETOH, tobacco, or drug use.   Review of Systems: Constitutional: Weight is stable.  Eyes: No changes in vision. ENT: No oral lesions, sore throat.  GI: see HPI.  Heme/Lymph: No easy bruising.  CV: No chest pain.  GU: No hematuria.  Integumentary: No rashes.  Neuro: No headaches.  Psych: No depression/anxiety.  Endocrine: No heat/cold intolerance.  Allergic/Immunologic: No urticaria.  Resp: No cough, SOB.  Musculoskeletal: No joint swelling.    Physical Examination: BP 110/59 mmHg  Pulse 84  Temp(Src) 99.3 F (37.4 C) (Oral)  Resp 18  Ht 5\' 11"  (1.803 m)  Wt 81.693 kg (180 lb 1.6 oz)  BMI 25.13 kg/m2  SpO2 100% Gen: NAD, alert and oriented x 4 HEENT: PEERLA, EOMI, Neck: supple, no JVD or thyromegaly Chest: CTA bilaterally, no wheezes, crackles, or other adventitious sounds CV: RRR, no m/g/c/r Abd: soft, tender in RUQ area at surgical site, ND, +BS in all four quadrants; no HSM, guarding, ridigity, or rebound tenderness Ext: no edema, well perfused with 2+ pulses, Skin: no rash or lesions noted Lymph: no LAD  Data: Lab Results  Component Value Date   WBC 6.6 09/03/2015   HGB 12.7* 09/03/2015   HCT 36.8* 09/03/2015   MCV 88.2 09/03/2015   PLT 86* 09/03/2015    Recent Labs Lab 08/29/15 0549 08/30/15 0607 09/03/15 0427  HGB 14.4 12.8* 12.7*   Lab Results  Component Value Date   NA 133* 09/03/2015   K 3.9 09/03/2015   CL 102 09/03/2015   CO2 25 09/03/2015   BUN 15 09/03/2015   CREATININE 1.17 09/03/2015   Lab Results  Component Value Date   ALT 27 09/03/2015   AST 27 09/03/2015   ALKPHOS 54 09/03/2015   BILITOT 1.5* 09/03/2015   No results for input(s): APTT, INR, PTT in the last 168 hours. Assessment/Plan: Trevor Deleon is a 81 y.o. male with probable CBD stone.  Recommendations: Recommend proceeding with ERCP today before pt is discharged. Discussed ERCP in detail. Discussed potential risks, incl. Pancreatitis. Pt agreed.   Thank you for the consult. Please call with questions or concerns.  Tomeko Scoville, Lupita Dawn, MD

## 2015-09-03 NOTE — Discharge Instructions (Signed)
Heart healthy lot fat diet. Activity as tolerated.

## 2015-09-04 LAB — BASIC METABOLIC PANEL
Anion gap: 7 (ref 5–15)
BUN: 14 mg/dL (ref 6–20)
CO2: 25 mmol/L (ref 22–32)
CREATININE: 1.18 mg/dL (ref 0.61–1.24)
Calcium: 8.2 mg/dL — ABNORMAL LOW (ref 8.9–10.3)
Chloride: 106 mmol/L (ref 101–111)
GFR calc Af Amer: >60 mL/min (ref >60)
GFR, EST NON AFRICAN AMERICAN: 56 mL/min — AB (ref >60)
GLUCOSE: 195 mg/dL — AB (ref 65–99)
POTASSIUM: 3.6 mmol/L (ref 3.5–5.1)
Sodium: 138 mmol/L (ref 135–145)

## 2015-09-04 LAB — LIPASE, BLOOD: Lipase: 30 U/L (ref 22–51)

## 2015-09-04 MED ORDER — MAGNESIUM SULFATE 2 GM/50ML IV SOLN
2.0000 g | Freq: Once | INTRAVENOUS | Status: AC
  Administered 2015-09-04: 2 g via INTRAVENOUS
  Filled 2015-09-04: qty 50

## 2015-09-04 MED ORDER — HYDROCODONE-ACETAMINOPHEN 5-325 MG PO TABS: 1.0000 | ORAL_TABLET | Freq: Four times a day (QID) | ORAL | Status: DC | PRN

## 2015-09-04 NOTE — Op Note (Signed)
[facility] Gastroenterology Patient Name: Trevor Deleon Procedure Date: 09/03/2015 10:19 AM MRN: [id number] Account #: [id number] Date of Birth: Mar 27, 1932 Admit Type: Inpatient Age: 81 Room: [facility] ENDO ROOM 4 Gender: Male Note Status: Finalized Procedure:         ERCP Indications:       Abdominal pain of suspected biliary origin, Filling defect                     on intraoperative cholangiogram, Abnormal liver function                     test Providers:         Lupita Dawn. Candace Cruise, MD Referring MD:      Christena Flake. Raechel Ache, MD (Referring MD) Medicines:         Monitored Anesthesia Care Complications:     No immediate complications. Procedure:         Pre-Anesthesia Assessment:                    - Prior to the procedure, a History and Physical was                     performed, and patient medications, allergies and                     sensitivities were reviewed. The patient's tolerance of                     previous anesthesia was reviewed.                    - The risks and benefits of the procedure and the sedation                     options and risks were discussed with the patient. All                     questions were answered and informed consent was obtained.                    - After reviewing the risks and benefits, the patient was                     deemed in satisfactory condition to undergo the procedure.                    After obtaining informed consent, the scope was passed                     under direct vision. Throughout the procedure, the                     patient's blood pressure, pulse, and oxygen saturations                     were monitored continuously. The Enteroscope was                     introduced through the mouth, and used to inject contrast                     into and used to cannulate the bile duct. The ERCP was  performed with difficulty due to challenging cannulation.                     The patient  tolerated the procedure well. Findings:      The scout film was normal. The esophagus was successfully intubated       under direct vision. Had difficulty getting scope into duodenum. Placed       pt on left lateral decubitus position to access duodenum.The scope was       advanced to a normal major papilla in the descending duodenum without       detailed examination of the pharynx, larynx and associated structures,       and upper GI tract. The upper GI tract was grossly normal. A straight       Roadrunner wire was passed into the pancreatic duct 1st. Since guidewire       kept going up the pancreatic duct, 5 fr x 3 cm pancreatic stent was       placed with good drainage. Then, guidewire was used to acceess biliary       tree. The short-nosed traction sphincterotome was passed over the       guidewire and the bile duct was then deeply cannulated. Contrast was       injected. I personally interpreted the bile duct images. Ductal flow of       contrast was adequate. Image quality was adequate. Contrast extended to       the entire biliary tree. The main bile duct appeared normal. Biliary       sphincterotomy was made with a monofilament traction (standard)       sphincterotome using ERBE electrocautery. There was no       post-sphincterotomy bleeding. The biliary tree was swept with a 12 mm       balloon starting at the bifurcation. Sludge was swept from the duct. One       stone was removed. No stones remained. Impression:        - A sphincterotomy was performed.                    - The biliary tree was swept.                    - Choledocholithiasis was found. Stone was removed.                    - No specimens collected. Recommendation:    - Observe patient in GI recovery unit.                    - Continue present medications.                    - The findings and recommendations were discussed with the                     patient's family.                    - Clear liquid diet  today.                    - Will need KUB in 2 weeks for stent placement. If stent                     remains, then EGD as outpt to remove stent. Procedure  Code(s): --- Professional ---                    [phone], Endoscopic retrograde cholangiopancreatography                     (ERCP); with removal of calculi/debris from                     biliary/pancreatic duct(s)                    43262, Endoscopic retrograde cholangiopancreatography                     (ERCP); with sphincterotomy/papillotomy Diagnosis Code(s): --- Professional ---                    K80.50, Calculus of bile duct without cholangitis or                     cholecystitis without obstruction                    R10.9, Unspecified abdominal pain                    R94.5, Abnormal results of liver function studies                    R93.2, Abnormal findings on diagnostic imaging of liver                     and biliary tract CPT copyright 2014 [employer]. All rights reserved. The codes documented in this report are preliminary and upon coder review may  be revised to meet current compliance requirements. Hulen Luster, MD 09/04/2015 7:40:10 AM This report has been signed electronically. Number of Addenda: 0 Note Initiated On: 09/03/2015 10:19 AM      [facility]

## 2015-09-04 NOTE — Discharge Summary (Signed)
[facility] [place] NAME: Trevor Deleon    MR#:  [id number]  DATE OF BIRTH:  28-Nov-1931  DATE OF ADMISSION:  08/29/2015 ADMITTING PHYSICIAN: Demetrios Loll, MD  DATE OF DISCHARGE:09/04/2015 PRIMARY CARE PHYSICIAN: THIES, DAVID, MD    ADMISSION DIAGNOSIS:  Acute pancreatitis, unspecified pancreatitis type [K85.9]   DISCHARGE DIAGNOSIS:  Acute pancreatitis. Chronic cholecystitis cholelithiasis, biliary pancreatitis, choledocholithiasis. SECONDARY DIAGNOSIS:   Past Medical History  Diagnosis Date  . GERD (gastroesophageal reflux disease)   . Hypertension   . Hernia, femoral   . CKD (chronic kidney disease) stage 3, GFR 30-59 ml/min   . BPH (benign prostatic hyperplasia)   . Hyperlipidemia     HOSPITAL COURSE:   Acute pancreatitis. Resolved. Lipase was 4978, trending down to normal range. He tolerated clear liquid diet after ERCP yesterday. Repeat lipase is normal.  Chronic cholecystitis cholelithiasis, biliary pancreatitis, choledocholithiasis. Status post Laparoscopic cholecystectomy with cholangiogram. S/p ERCP with stent placement by Dr. Candace Cruise.  Abnormal liver function test. Liver function test improved to normal range. Hypokalemia. Improved. Thrombocytopenia. Chronic and stable. CAD. Hold aspirin for surgery. Resume ASA. CKD stage 3. Stable HTN. Controlled, continue hypertension medication. HLP. Continue statin. Hyponatremia. Given Kyle iv, improved. Hypomagnesemia. Mag iv.   DISCHARGE CONDITIONS:   Stable, discharge to home today.  CONSULTS OBTAINED:  Treatment Team:  Leonie Green, MD Yolonda Kida, MD Hulen Luster, MD  DRUG ALLERGIES:  No Known Allergies  DISCHARGE MEDICATIONS:   Current Discharge Medication List    START taking these medications   Details  HYDROcodone-acetaminophen (NORCO/VICODIN) 5-325 MG tablet Take 1 tablet by mouth every 6 (six) hours as needed for moderate pain. Qty: 12  tablet, Refills: 0      CONTINUE these medications which have NOT CHANGED   Details  acetaminophen (TYLENOL) 500 MG tablet Take 1,000 mg by mouth every 6 (six) hours as needed for mild pain or moderate pain (joint pain).    ALPRAZolam (XANAX) 0.25 MG tablet Take 1 tablet by mouth at bedtime as needed for sleep.  Refills: 3    aspirin EC 81 MG tablet Take 81 mg by mouth daily.    doxazosin (CARDURA) 4 MG tablet Take 4 mg by mouth daily.    Multiple Vitamin (MULTIVITAMIN) tablet Take 1 tablet by mouth daily.    Omega-3 Fatty Acids (FISH OIL) 1000 MG CAPS Take 3,000 mg by mouth 2 (two) times daily.    omeprazole (PRILOSEC) 20 MG capsule Take 40 mg by mouth daily.     Polyethylene Glycol 3350 (MIRALAX PO) Take 17 g by mouth daily.     potassium chloride (K-DUR) 10 MEQ tablet Take 30 mEq by mouth daily.    Psyllium (METAMUCIL PO) Take 1 Dose by mouth daily.     ranitidine (ZANTAC) 150 MG tablet Take 150 mg by mouth 2 (two) times daily.    simvastatin (ZOCOR) 40 MG tablet Take 40 mg by mouth at bedtime.     triamterene-hydrochlorothiazide (MAXZIDE-25) 37.5-25 MG per tablet Take 1 tablet by mouth daily.         DISCHARGE INSTRUCTIONS:    If you experience worsening of your admission symptoms, develop shortness of breath, life threatening emergency, suicidal or homicidal thoughts you must seek medical attention immediately by calling 911 or calling your MD immediately  if symptoms less severe.  You Must read complete instructions/literature along with all the possible adverse reactions/side effects for all the Medicines you take  and that have been prescribed to you. Take any new Medicines after you have completely understood and accept all the possible adverse reactions/side effects.   Please note  You were cared for by a hospitalist during your hospital stay. If you have any questions about your discharge medications or the care you received while you were in the hospital after  you are discharged, you can call the unit and asked to speak with the hospitalist on call if the hospitalist that took care of you is not available. Once you are discharged, your primary care physician will handle any further medical issues. Please note that NO REFILLS for any discharge medications will be authorized once you are discharged, as it is imperative that you return to your primary care physician (or establish a relationship with a primary care physician if you do not have one) for your aftercare needs so that they can reassess your need for medications and monitor your lab values.    Today   SUBJECTIVE   No. Tolerated diet.   VITAL SIGNS:  Blood pressure 141/64, pulse 86, temperature 100.2 F (37.9 C), temperature source Oral, resp. rate 18, height 5\' 11"  (1.803 m), weight 81.693 kg (180 lb 1.6 oz), SpO2 94 %.  I/O:   Intake/Output Summary (Last 24 hours) at 09/04/15 1434 Last data filed at 09/04/15 1200  Gross per 24 hour  Intake 2537.6 ml  Output   1375 ml  Net 1162.6 ml    PHYSICAL EXAMINATION:  GENERAL:  81 y.o.-year-old patient lying in the bed with no acute distress.  EYES: Pupils equal, round, reactive to light and accommodation. No scleral icterus. Extraocular muscles intact.  HEENT: Head atraumatic, normocephalic. Oropharynx and nasopharynx clear.  NECK:  Supple, no jugular venous distention. No thyroid enlargement, no tenderness.  LUNGS: Normal breath sounds bilaterally, no wheezing, rales,rhonchi or crepitation. No use of accessory muscles of respiration.  CARDIOVASCULAR: S1, S2 normal. No murmurs, rubs, or gallops.  ABDOMEN: Soft, tenderness on surgical site, non-distended. Bowel sounds present. No organomegaly or mass.  EXTREMITIES: No pedal edema, cyanosis, or clubbing.  NEUROLOGIC: Cranial nerves II through XII are intact. Muscle strength 5/5 in all extremities. Sensation intact. Gait not checked.  PSYCHIATRIC: The patient is alert and oriented x 3.   SKIN: No obvious rash, lesion, or ulcer.   DATA REVIEW:   CBC  Recent Labs Lab 09/03/15 0427  WBC 6.6  HGB 12.7*  HCT 36.8*  PLT 86*    Chemistries   Recent Labs Lab 09/03/15 0427 09/03/15 1704 09/04/15 0936  NA 133*  --  138  K 3.9  --  3.6  CL 102  --  106  CO2 25  --  25  GLUCOSE 155*  --  195*  BUN 15  --  14  CREATININE 1.17  --  1.18  CALCIUM 8.3*  --  8.2*  MG  --  1.5*  --   AST 27  --   --   ALT 27  --   --   ALKPHOS 54  --   --   BILITOT 1.5*  --   --     Cardiac Enzymes  Recent Labs Lab 08/29/15 0549  TROPONINI <0.03    Microbiology Results  Results for orders placed or performed during the hospital encounter of 08/29/15  Surgical pcr screen     Status: None   Collection Time: 09/02/15  4:42 AM  Result Value Ref Range Status   MRSA, PCR NEGATIVE NEGATIVE  Final   Staphylococcus aureus NEGATIVE NEGATIVE Final    Comment:        The Xpert SA Assay (FDA approved for NASAL specimens in patients over 79 years of age), is one component of a comprehensive surveillance program.  Test performance has been validated by [facility] for patients greater than or equal to 26 year old. It is not intended to diagnose infection nor to guide or monitor treatment.     RADIOLOGY:  Dg C-arm 1-60 Min-no Report  09/03/2015  CLINICAL DATA: stones C-ARM 1-60 MINUTES Fluoroscopy was utilized by the requesting physician.  No radiographic interpretation.        Management plans discussed with the patient, family and they are in agreement.  CODE STATUS:     Code Status Orders        Start     Ordered   08/29/15 0947  Full code   Continuous     08/29/15 0946      TOTAL TIME TAKING CARE OF THIS PATIENT: 35 minutes.    Demetrios Loll M.D on 09/04/2015 at 2:34 PM  Between 7am to 6pm - Pager - [phone]  After 6pm go to [email or web address] - password [email or web address] [facility]  Office  [phone]  CC: Primary care physician;  Ezequiel Kayser, MD

## 2015-09-04 NOTE — Progress Notes (Signed)
He reports improvement today. He is having less pain. He has tolerated his clear liquid diet for breakfast.  Abdomen is soft with minimal tenderness, moderate ecchymosis below the umbilicus.  Diagnosis chronic cholecystitis cholelithiasis choledocholithiasis and biliary pancreatitis  Dr. Candace Cruise ordered repeat lipase. If lipase normal can go home today. Recommend low-fat diet. Follow-up in my office in 2 weeks.

## 2015-09-04 NOTE — Care Management Important Message (Signed)
Important Message  Patient Details  Name: Trevor Deleon. MRN: [id number] Date of Birth: [birth year]   Medicare Important Message Given:  Yes-fourth notification given    Darius Bump Allmond 09/04/2015, 1:38 PM

## 2015-09-04 NOTE — Consult Note (Signed)
  GI Inpatient Follow-up Note  Patient Identification: Trevor Deleon. is a 81 y.o. male with CBD stone, that was extracted during ERCP yesterday.  Subjective: Overall feels better. Tolerated clears yesterday. Abdomen still sore.  Scheduled Inpatient Medications:  . doxazosin  4 mg Oral Q2000  . famotidine  20 mg Oral BID  . pantoprazole  40 mg Oral Daily  . potassium chloride  30 mEq Oral Daily  . simvastatin  40 mg Oral QHS    Continuous Inpatient Infusions:   . sodium chloride 75 mL/hr at 09/04/15 0613    PRN Inpatient Medications:  acetaminophen **OR** acetaminophen, albuterol, ALPRAZolam, HYDROcodone-acetaminophen, morphine injection, ondansetron **OR** ondansetron (ZOFRAN) IV  Review of Systems: Constitutional: Weight is stable.  Eyes: No changes in vision. ENT: No oral lesions, sore throat.  GI: see HPI.  Heme/Lymph: No easy bruising.  CV: No chest pain.  GU: No hematuria.  Integumentary: No rashes.  Neuro: No headaches.  Psych: No depression/anxiety.  Endocrine: No heat/cold intolerance.  Allergic/Immunologic: No urticaria.  Resp: No cough, SOB.  Musculoskeletal: No joint swelling.    Physical Examination: BP 141/64 mmHg  Pulse 86  Temp(Src) 100.2 F (37.9 C) (Oral)  Resp 18  Ht 5\' 11"  (1.803 m)  Wt 81.693 kg (180 lb 1.6 oz)  BMI 25.13 kg/m2  SpO2 94% Gen: NAD, alert and oriented x 4 HEENT: PEERLA, EOMI, Neck: supple, no JVD or thyromegaly Chest: CTA bilaterally, no wheezes, crackles, or other adventitious sounds CV: RRR, no m/g/c/r Abd: soft, mild to moderately tender, ND, +BS in all four quadrants; no HSM, guarding, ridigity, or rebound tenderness Ext: no edema, well perfused with 2+ pulses, Skin: no rash or lesions noted Lymph: no LAD  Data: Lab Results  Component Value Date   WBC 6.6 09/03/2015   HGB 12.7* 09/03/2015   HCT 36.8* 09/03/2015   MCV 88.2 09/03/2015   PLT 86* 09/03/2015    Recent Labs Lab 08/29/15 0549 08/30/15 0607  09/03/15 0427  HGB 14.4 12.8* 12.7*   Lab Results  Component Value Date   NA 133* 09/03/2015   K 3.9 09/03/2015   CL 102 09/03/2015   CO2 25 09/03/2015   BUN 15 09/03/2015   CREATININE 1.17 09/03/2015   Lab Results  Component Value Date   ALT 27 09/03/2015   AST 27 09/03/2015   ALKPHOS 54 09/03/2015   BILITOT 1.5* 09/03/2015   No results for input(s): APTT, INR, PTT in the last 168 hours. Assessment/Plan: Mr. Cartmell is a 81 y.o. male with gallstones/CBD stone.  Recommendations: Check lipase today to make sure pt has no pancreatitis. If level ok, then can be discharged on low fat diet. KUB in 2 weeks or so when he f/u with Dr. Nicholes Stairs. Will sign off. thanks Please call with questions or concerns.  Trevor Deleon, Trevor Dawn, MD

## 2015-09-09 ENCOUNTER — Encounter: Payer: Self-pay | Admitting: Gastroenterology

## 2015-09-15 ENCOUNTER — Ambulatory Visit: Admission: RE | Admit: 2015-09-15 | Payer: Medicare Other | Source: Ambulatory Visit | Admitting: Surgery

## 2015-12-02 DIAGNOSIS — Z01812 Encounter for preprocedural laboratory examination: Secondary | ICD-10-CM | POA: Diagnosis not present

## 2015-12-02 DIAGNOSIS — K4091 Unilateral inguinal hernia, without obstruction or gangrene, recurrent: Secondary | ICD-10-CM | POA: Diagnosis not present

## 2015-12-08 ENCOUNTER — Other Ambulatory Visit: Payer: Medicare Other

## 2015-12-08 ENCOUNTER — Encounter: Payer: Self-pay | Admitting: *Deleted

## 2015-12-08 NOTE — Patient Instructions (Signed)
  Your procedure is scheduled on: 12/15/15 Report to Day Surgery. To find out your arrival time please call [phone] between 1PM - 3PM on 12/14/15.  Remember: Instructions that are not followed completely may result in serious medical risk, up to and including death, or upon the discretion of your surgeon and anesthesiologist your surgery may need to be rescheduled.    __X__ 1. Do not eat food or drink liquids after midnight. No gum chewing or hard candies.     ___X_ 2. No Alcohol for 24 hours before or after surgery.   ____ 3. Bring all medications with you on the day of surgery if instructed.    _X___ 4. Notify your doctor if there is any change in your medical condition     (cold, fever, infections).     Do not wear jewelry, make-up, hairpins, clips or nail polish.  Do not wear lotions, powders, or perfumes. You may wear deodorant.  Do not shave 48 hours prior to surgery. Men may shave face and neck.  Do not bring valuables to the hospital.    [facility] is not responsible for any belongings or valuables.               Contacts, dentures or bridgework may not be worn into surgery.  Leave your suitcase in the car. After surgery it may be brought to your room.  For patients admitted to the hospital, discharge time is determined by your                treatment team.   Patients discharged the day of surgery will not be allowed to drive home.   Please read over the following fact sheets that you were given:   Surgical Site Infection Prevention   ____ Take these medicines the morning of surgery with A SIP OF WATER:    1. ZANTAC AT BEDTIME 12/14/15 AND AM OF SURGERY  2.   3.   4.  5.  6.  ____ Fleet Enema (as directed)   ____ Use CHG Soap as directed  ____ Use inhalers on the day of surgery  ____ Stop metformin 2 days prior to surgery    ____ Take 1/2 of usual insulin dose the night before surgery and none on the morning of surgery.   __X__ Stop  Coumadin/Plavix/aspirin on  ASPIRIN ALREADY STOPPED  ____ Stop Anti-inflammatories on    _X___ Stop supplements until after surgery. FISH OIL STOPPED   ____ Bring C-Pap to the hospital.

## 2015-12-08 NOTE — Pre-Procedure Instructions (Signed)
INSTRUCTIONS MAILED TO PATIENT

## 2015-12-15 ENCOUNTER — Ambulatory Visit
Admission: RE | Admit: 2015-12-15 | Discharge: 2015-12-15 | Disposition: A | Discharge status: Home / Self Care | Payer: PPO | Source: Ambulatory Visit | Attending: Surgery | Admitting: Surgery

## 2015-12-15 ENCOUNTER — Encounter
Admission: RE | Disposition: A | Discharge status: Home / Self Care | Payer: Self-pay | Source: Ambulatory Visit | Attending: Surgery

## 2015-12-15 ENCOUNTER — Ambulatory Visit: Payer: PPO | Admitting: Anesthesiology

## 2015-12-15 ENCOUNTER — Encounter: Payer: Self-pay | Admitting: *Deleted

## 2015-12-15 DIAGNOSIS — Z8042 Family history of malignant neoplasm of prostate: Secondary | ICD-10-CM | POA: Diagnosis not present

## 2015-12-15 DIAGNOSIS — I129 Hypertensive chronic kidney disease with stage 1 through stage 4 chronic kidney disease, or unspecified chronic kidney disease: Secondary | ICD-10-CM | POA: Insufficient documentation

## 2015-12-15 DIAGNOSIS — Z9049 Acquired absence of other specified parts of digestive tract: Secondary | ICD-10-CM | POA: Diagnosis not present

## 2015-12-15 DIAGNOSIS — N183 Chronic kidney disease, stage 3 (moderate): Secondary | ICD-10-CM | POA: Diagnosis not present

## 2015-12-15 DIAGNOSIS — E1122 Type 2 diabetes mellitus with diabetic chronic kidney disease: Secondary | ICD-10-CM | POA: Diagnosis not present

## 2015-12-15 DIAGNOSIS — Z955 Presence of coronary angioplasty implant and graft: Secondary | ICD-10-CM | POA: Diagnosis not present

## 2015-12-15 DIAGNOSIS — K219 Gastro-esophageal reflux disease without esophagitis: Secondary | ICD-10-CM | POA: Diagnosis not present

## 2015-12-15 DIAGNOSIS — Z825 Family history of asthma and other chronic lower respiratory diseases: Secondary | ICD-10-CM | POA: Diagnosis not present

## 2015-12-15 DIAGNOSIS — D176 Benign lipomatous neoplasm of spermatic cord: Secondary | ICD-10-CM | POA: Diagnosis not present

## 2015-12-15 DIAGNOSIS — F17210 Nicotine dependence, cigarettes, uncomplicated: Secondary | ICD-10-CM | POA: Diagnosis not present

## 2015-12-15 DIAGNOSIS — Z7982 Long term (current) use of aspirin: Secondary | ICD-10-CM | POA: Insufficient documentation

## 2015-12-15 DIAGNOSIS — E785 Hyperlipidemia, unspecified: Secondary | ICD-10-CM | POA: Insufficient documentation

## 2015-12-15 DIAGNOSIS — K4090 Unilateral inguinal hernia, without obstruction or gangrene, not specified as recurrent: Secondary | ICD-10-CM | POA: Diagnosis not present

## 2015-12-15 DIAGNOSIS — I252 Old myocardial infarction: Secondary | ICD-10-CM | POA: Insufficient documentation

## 2015-12-15 DIAGNOSIS — Z803 Family history of malignant neoplasm of breast: Secondary | ICD-10-CM | POA: Diagnosis not present

## 2015-12-15 DIAGNOSIS — Z96652 Presence of left artificial knee joint: Secondary | ICD-10-CM | POA: Diagnosis not present

## 2015-12-15 DIAGNOSIS — N529 Male erectile dysfunction, unspecified: Secondary | ICD-10-CM | POA: Diagnosis not present

## 2015-12-15 DIAGNOSIS — K449 Diaphragmatic hernia without obstruction or gangrene: Secondary | ICD-10-CM | POA: Diagnosis not present

## 2015-12-15 DIAGNOSIS — Z823 Family history of stroke: Secondary | ICD-10-CM | POA: Insufficient documentation

## 2015-12-15 DIAGNOSIS — I10 Essential (primary) hypertension: Secondary | ICD-10-CM | POA: Diagnosis not present

## 2015-12-15 DIAGNOSIS — Z8249 Family history of ischemic heart disease and other diseases of the circulatory system: Secondary | ICD-10-CM | POA: Insufficient documentation

## 2015-12-15 DIAGNOSIS — I2510 Atherosclerotic heart disease of native coronary artery without angina pectoris: Secondary | ICD-10-CM | POA: Insufficient documentation

## 2015-12-15 DIAGNOSIS — Z79899 Other long term (current) drug therapy: Secondary | ICD-10-CM | POA: Insufficient documentation

## 2015-12-15 DIAGNOSIS — Z800 Family history of malignant neoplasm of digestive organs: Secondary | ICD-10-CM | POA: Insufficient documentation

## 2015-12-15 DIAGNOSIS — K4091 Unilateral inguinal hernia, without obstruction or gangrene, recurrent: Secondary | ICD-10-CM | POA: Diagnosis not present

## 2015-12-15 HISTORY — DX: Male erectile dysfunction, unspecified: N52.9

## 2015-12-15 HISTORY — DX: Acute myocardial infarction, unspecified: I21.9

## 2015-12-15 HISTORY — DX: Atherosclerotic heart disease of native coronary artery without angina pectoris: I25.10

## 2015-12-15 HISTORY — DX: Esophageal obstruction: K22.2

## 2015-12-15 HISTORY — DX: Disease of stomach and duodenum, unspecified: K31.9

## 2015-12-15 HISTORY — DX: Other pancytopenia: D61.818

## 2015-12-15 HISTORY — DX: Testicular hypofunction: E29.1

## 2015-12-15 HISTORY — DX: Personal history of other diseases of the digestive system: Z87.19

## 2015-12-15 HISTORY — DX: Type 2 diabetes mellitus without complications: E11.9

## 2015-12-15 HISTORY — DX: Labyrinthitis, unspecified ear: H83.09

## 2015-12-15 HISTORY — DX: Unspecified osteoarthritis, unspecified site: M19.90

## 2015-12-15 HISTORY — PX: INGUINAL HERNIA REPAIR: SHX194

## 2015-12-15 SURGERY — REPAIR, HERNIA, INGUINAL, ADULT
Anesthesia: General | Laterality: Left | Wound class: Clean

## 2015-12-15 MED ORDER — BUPIVACAINE-EPINEPHRINE (PF) 0.5% -1:200000 IJ SOLN
INTRAMUSCULAR | Status: AC
  Filled 2015-12-15: qty 30

## 2015-12-15 MED ORDER — FENTANYL CITRATE (PF) 100 MCG/2ML IJ SOLN
INTRAMUSCULAR | Status: AC
  Filled 2015-12-15: qty 2

## 2015-12-15 MED ORDER — PROPOFOL 10 MG/ML IV BOLUS
INTRAVENOUS | Status: DC | PRN
  Administered 2015-12-15: 50 mg via INTRAVENOUS
  Administered 2015-12-15: 150 mg via INTRAVENOUS

## 2015-12-15 MED ORDER — KETAMINE HCL 10 MG/ML IJ SOLN
INTRAMUSCULAR | Status: DC | PRN
  Administered 2015-12-15: 25 mg via INTRAVENOUS

## 2015-12-15 MED ORDER — HYDROCODONE-ACETAMINOPHEN 5-325 MG PO TABS
ORAL_TABLET | ORAL | Status: AC
  Administered 2015-12-15: 1 via ORAL
  Filled 2015-12-15: qty 1

## 2015-12-15 MED ORDER — HYDROCODONE-ACETAMINOPHEN 5-325 MG PO TABS: 1.0000 | ORAL_TABLET | ORAL | Status: DC | PRN

## 2015-12-15 MED ORDER — FENTANYL CITRATE (PF) 100 MCG/2ML IJ SOLN
INTRAMUSCULAR | Status: DC | PRN
  Administered 2015-12-15: 50 ug via INTRAVENOUS
  Administered 2015-12-15 (×2): 25 ug via INTRAVENOUS
  Administered 2015-12-15: 50 ug via INTRAVENOUS
  Administered 2015-12-15 (×2): 25 ug via INTRAVENOUS

## 2015-12-15 MED ORDER — GLYCOPYRROLATE 0.2 MG/ML IJ SOLN
INTRAMUSCULAR | Status: DC | PRN
  Administered 2015-12-15: 0.2 mg via INTRAVENOUS

## 2015-12-15 MED ORDER — CEFAZOLIN SODIUM 1-5 GM-% IV SOLN
INTRAVENOUS | Status: AC
  Administered 2015-12-15: 1 g via INTRAVENOUS
  Filled 2015-12-15: qty 50

## 2015-12-15 MED ORDER — ONDANSETRON HCL 4 MG/2ML IJ SOLN
INTRAMUSCULAR | Status: DC | PRN
  Administered 2015-12-15: 4 mg via INTRAVENOUS

## 2015-12-15 MED ORDER — HYDROCODONE-ACETAMINOPHEN 5-325 MG PO TABS
1.0000 | ORAL_TABLET | ORAL | Status: DC | PRN
  Administered 2015-12-15 (×2): 1 via ORAL

## 2015-12-15 MED ORDER — HYDROCODONE-ACETAMINOPHEN 5-325 MG PO TABS
ORAL_TABLET | ORAL | Status: AC
  Filled 2015-12-15: qty 1

## 2015-12-15 MED ORDER — ONDANSETRON HCL 4 MG/2ML IJ SOLN: 4.0000 mg | Freq: Once | INTRAMUSCULAR | Status: DC | PRN

## 2015-12-15 MED ORDER — FENTANYL CITRATE (PF) 100 MCG/2ML IJ SOLN
25.0000 ug | INTRAMUSCULAR | Status: DC | PRN
  Administered 2015-12-15 (×3): 50 ug via INTRAVENOUS

## 2015-12-15 MED ORDER — LACTATED RINGERS IV SOLN
INTRAVENOUS | Status: DC
  Administered 2015-12-15 (×2): via INTRAVENOUS

## 2015-12-15 MED ORDER — LIDOCAINE HCL (PF) 2 % IJ SOLN
INTRAMUSCULAR | Status: DC | PRN
  Administered 2015-12-15: 50 mg

## 2015-12-15 MED ORDER — BUPIVACAINE-EPINEPHRINE (PF) 0.5% -1:200000 IJ SOLN
INTRAMUSCULAR | Status: DC | PRN
  Administered 2015-12-15: 15 mL

## 2015-12-15 MED ORDER — HYDROCODONE-ACETAMINOPHEN 5-325 MG PO TABS
1.0000 | ORAL_TABLET | ORAL | Status: DC | PRN
Start: 2015-12-15 — End: 2017-09-23

## 2015-12-15 MED ORDER — CEFAZOLIN SODIUM 1-5 GM-% IV SOLN
1.0000 g | INTRAVENOUS | Status: AC
  Administered 2015-12-15: 1 g via INTRAVENOUS

## 2015-12-15 SURGICAL SUPPLY — 28 items
BLADE SURG 15 STRL LF DISP TIS (BLADE) ×1 IMPLANT
BLADE SURG 15 STRL SS (BLADE) ×2
CANISTER SUCT 1200ML W/VALVE (MISCELLANEOUS) ×3 IMPLANT
CHLORAPREP W/TINT 26ML (MISCELLANEOUS) ×3 IMPLANT
DRAIN PENROSE 5/8X18 LTX STRL (WOUND CARE) ×3 IMPLANT
DRAPE LAPAROTOMY 77X122 PED (DRAPES) ×3 IMPLANT
ELECT REM PT RETURN 9FT ADLT (ELECTROSURGICAL) ×3
ELECTRODE REM PT RTRN 9FT ADLT (ELECTROSURGICAL) ×1 IMPLANT
GLOVE BIO SURGEON STRL SZ7.5 (GLOVE) ×12 IMPLANT
GLOVE BIOGEL PI IND STRL 6.5 (GLOVE) ×2 IMPLANT
GLOVE BIOGEL PI INDICATOR 6.5 (GLOVE) ×4
GLOVE EXAM NITRILE PF MED BLUE (GLOVE) ×3 IMPLANT
GOWN STRL REUS W/ TWL LRG LVL3 (GOWN DISPOSABLE) ×4 IMPLANT
GOWN STRL REUS W/TWL LRG LVL3 (GOWN DISPOSABLE) ×8
KIT RM TURNOVER STRD PROC AR (KITS) ×3 IMPLANT
LABEL OR SOLS (LABEL) ×3 IMPLANT
LIQUID BAND (GAUZE/BANDAGES/DRESSINGS) ×3 IMPLANT
MESH SYNTHETIC 4X6 SOFT BARD (Mesh General) ×1 IMPLANT
MESH SYNTHETIC SOFT BARD 4X6 (Mesh General) ×2 IMPLANT
NEEDLE HYPO 25X1 1.5 SAFETY (NEEDLE) ×3 IMPLANT
NS IRRIG 500ML POUR BTL (IV SOLUTION) ×3 IMPLANT
PACK BASIN MINOR ARMC (MISCELLANEOUS) ×3 IMPLANT
SUT CHROMIC 4 0 RB 1X27 (SUTURE) IMPLANT
SUT MNCRL AB 4-0 PS2 18 (SUTURE) ×3 IMPLANT
SUT SURGILON 0 30 BLK (SUTURE) ×6 IMPLANT
SUT VIC AB 4-0 SH 27 (SUTURE) ×4
SUT VIC AB 4-0 SH 27XANBCTRL (SUTURE) ×2 IMPLANT
SYRINGE 10CC LL (SYRINGE) ×3 IMPLANT

## 2015-12-15 NOTE — Op Note (Signed)
OPERATIVE REPORT  PREOPERATIVE DIAGNOSIS: Recurrent left inguinal hernia  POSTOPERATIVE DIAGNOSIS Recurrent:left  inguinal hernia  PROCEDURE:  left inguinal hernia repair  ANESTHESIA:  General  SURGEON:  Rochel Brome M.D.  INDICATIONS: He has had recent bulging in the left groin. A left inguinal hernia was demonstrated on physical exam he had a previous repair many years ago. Repair was recommended for definitive treatment.  With the patient on the operating table in the supine position the left lower quadrant was prepared with clippers and with ChloraPrep and draped in a sterile manner. A transversely oriented suprapubic incision was made and carried down through subcutaneous tissues. Electrocautery was used for hemostasis. The Scarpa's fascia was incised. The external oblique aponeurosis was incised along the course of its fibers to open the external ring and expose the inguinal cord structures. The cord structures were mobilized. A Penrose drain was passed around the cord structures for traction. Scar tissue was identified in the course of dissection. There was a finding of a direct inguinal hernia. The hernia sac was dissected free from surrounding structures. Attenuated transversalis fascia was incised. The sac was opened. Its continuity with the peritoneal cavity was demonstrated. The sac was ligated with a 4-0 Vicryl suture ligature and was amputated. A cord lipoma was also dissected free from surrounding structures and ligated with 4-0 Vicryl and excised. No tissues were submitted for pathology. The repair was carried out with 0 Surgilon sutures suturing the conjoined tendon to the to the shelving edge of the inguinal ligament incorporating transversalis fascia into the repair. The last stitch led to satisfactory narrowing of the internal ring. Bard soft mesh was cut to create an oval shape and was placed over the repair. This was sutured to the repair with interrupted 0 Surgilon sutures and  also sutured medially to the deep fascia and on both sides of the internal ring. Next after seeing hemostasis was intact the arch structures were replaced along the floor of the inguinal canal. The cut edges of the external oblique aponeurosis were closed with a running 4-0 Vicryl suture to re-create the external ring. The deep fascia superior and lateral to the repair site was infiltrated with half percent Sensorcaine with epinephrine. Subcutaneous tissues were also infiltrated. The Scarpa's fascia was closed with interrupted 4-0 Vicryl sutures. The skin was closed with running 4-0 Monocryl subcuticular suture and LiquiBand. The testicle remained in the scrotum  The patient appeared to be in satisfactory condition and was prepared for transfer to the recovery room.  Rochel Brome M.D.

## 2015-12-15 NOTE — Anesthesia Preprocedure Evaluation (Signed)
Anesthesia Evaluation  Patient identified by MRN, date of birth, ID band Patient awake    Reviewed: Allergy & Precautions, H&P , NPO status , Patient's Chart, lab work & pertinent test results  History of Anesthesia Complications Negative for: history of anesthetic complications  Airway Mallampati: III  TM Distance: >3 FB Neck ROM: limited    Dental  (+) Poor Dentition   Pulmonary neg pulmonary ROS, neg shortness of breath, former smoker,    Pulmonary exam normal breath sounds clear to auscultation       Cardiovascular Exercise Tolerance: Good hypertension, (-) angina+ CAD, + Past MI and + Cardiac Stents  (-) CABG Normal cardiovascular exam(-) Valvular Problems/Murmurs Rhythm:regular Rate:Normal     Neuro/Psych negative neurological ROS  negative psych ROS   GI/Hepatic Neg liver ROS, hiatal hernia, GERD  Controlled,  Endo/Other  negative endocrine ROSdiabetes  Renal/GU CRFRenal disease  negative genitourinary   Musculoskeletal   Abdominal   Peds  Hematology negative hematology ROS (+)   Anesthesia Other Findings Past Medical History:   GERD (gastroesophageal reflux disease)                       Hypertension                                                 Hernia, femoral                                              CKD (chronic kidney disease) stage 3, GFR 30-5*              BPH (benign prostatic hyperplasia)                           Hyperlipidemia                                              Past Surgical History:   JOINT REPLACEMENT                                             CARDIAC SURGERY                                               TONSILLECTOMY                                                 APPENDECTOMY                                                  CHOLECYSTECTOMY  N/A 09/02/2015     Comment:Procedure: LAPAROSCOPIC CHOLECYSTECTOMY;                Surgeon: Leonie Green, MD;  Location:               [facility] ORS;  Service: General;  Laterality: N/A;   INTRAOPERATIVE CHOLANGIOGRAM                     09/02/2015     Comment:Procedure: INTRAOPERATIVE CHOLANGIOGRAM;                Surgeon: Leonie Green, MD;  Location:               [facility] ORS;  Service: General;;  BMI    Body Mass Index   25.13 kg/m 2      Reproductive/Obstetrics negative OB ROS                             Anesthesia Physical  Anesthesia Plan  ASA: III  Anesthesia Plan: General   Post-op Pain Management:    Induction:   Airway Management Planned:   Additional Equipment:   Intra-op Plan:   Post-operative Plan:   Informed Consent: I have reviewed the patients History and Physical, chart, labs and discussed the procedure including the risks, benefits and alternatives for the proposed anesthesia with the patient or authorized representative who has indicated his/her understanding and acceptance.   Dental Advisory Given  Plan Discussed with: Anesthesiologist, CRNA and Surgeon  Anesthesia Plan Comments:         Anesthesia Quick Evaluation

## 2015-12-15 NOTE — Transfer of Care (Signed)
Immediate Anesthesia Transfer of Care Note  Patient: Trevor Deleon.  Procedure(s) Performed: Procedure(s): HERNIA REPAIR INGUINAL ADULT (Left)  Patient Location: PACU  Anesthesia Type:General  Level of Consciousness: awake  Airway & Oxygen Therapy: Patient Spontanous Breathing and Patient connected to face mask oxygen  Post-op Assessment: Report given to RN  Post vital signs: Reviewed  Last Vitals:  Filed Vitals:   12/15/15 0848 12/15/15 1238  BP: 135/80 140/85  Pulse: 94 85  Temp: 36.1 C 36.5 C  Resp: 16 12    Complications: No apparent anesthesia complications

## 2015-12-15 NOTE — H&P (Signed)
  He reports no change in condition since office exam.  Discussed plan for left inguinal hernia repair.

## 2015-12-15 NOTE — OR Nursing (Signed)
Dr. Tamala Julian Visited. OK'd d/c home

## 2015-12-15 NOTE — Discharge Instructions (Addendum)
Take Tylenol or Norco if needed for pain.  Resume aspirin on Thursday.  May shower.  Avoid straining and heavy lifting.  AMBULATORY SURGERY  DISCHARGE INSTRUCTIONS 1) The drugs that you were given will stay in your system until tomorrow so for the next 24 hours you should not: A) Drive an automobile B) Make any legal decisions C) Drink any alcoholic beverage  2) You may resume regular meals tomorrow.  Today it is better to start with liquids and gradually work up to solid foods. You may eat anything you prefer, but it is better to start with liquids, then soup and crackers, and gradually work up to solid foods.  3) Please notify your doctor immediately if you have any unusual bleeding, trouble breathing, redness and pain at the surgery site, drainage, fever, or pain not relieved by medication.  4) Additional Instructions:  Please contact your physician with any problems or Same Day Surgery at [phone], Monday through Friday 6 am to 4 pm, or [place] at [facility] number at [phone].

## 2015-12-15 NOTE — Anesthesia Procedure Notes (Signed)
Procedure Name: LMA Insertion Performed by: Journey Castonguay Pre-anesthesia Checklist: Patient identified, Patient being monitored, Timeout performed, Emergency Drugs available and Suction available Patient Re-evaluated:Patient Re-evaluated prior to inductionOxygen Delivery Method: Circle system utilized Preoxygenation: Pre-oxygenation with 100% oxygen Intubation Type: IV induction Ventilation: Mask ventilation without difficulty LMA: LMA inserted LMA Size: 5.0 Tube type: Oral Number of attempts: 1 Placement Confirmation: positive ETCO2 and breath sounds checked- equal and bilateral Tube secured with: Tape Dental Injury: Teeth and Oropharynx as per pre-operative assessment      

## 2015-12-16 ENCOUNTER — Encounter: Payer: Self-pay | Admitting: Surgery

## 2015-12-17 NOTE — Anesthesia Postprocedure Evaluation (Signed)
Anesthesia Post Note  Patient: Trevor Deleon.  Procedure(s) Performed: Procedure(s) (LRB): HERNIA REPAIR INGUINAL ADULT (Left)  Patient location during evaluation: PACU Anesthesia Type: General Level of consciousness: awake and alert Pain management: pain level controlled Vital Signs Assessment: post-procedure vital signs reviewed and stable Respiratory status: spontaneous breathing, nonlabored ventilation, respiratory function stable and patient connected to nasal cannula oxygen Cardiovascular status: blood pressure returned to baseline and stable Postop Assessment: no signs of nausea or vomiting Anesthetic complications: no    Last Vitals:  Filed Vitals:   12/15/15 1455 12/15/15 1546  BP: 160/68 144/70  Pulse: 70 71  Temp:  36.9 C  Resp: 18 16    Last Pain:  Filed Vitals:   12/15/15 1549  PainSc: 3                  Martha Clan

## 2016-03-17 DIAGNOSIS — N183 Chronic kidney disease, stage 3 (moderate): Secondary | ICD-10-CM | POA: Diagnosis not present

## 2016-03-17 DIAGNOSIS — E782 Mixed hyperlipidemia: Secondary | ICD-10-CM | POA: Diagnosis not present

## 2016-03-17 DIAGNOSIS — D61818 Other pancytopenia: Secondary | ICD-10-CM | POA: Diagnosis not present

## 2016-03-17 DIAGNOSIS — N400 Enlarged prostate without lower urinary tract symptoms: Secondary | ICD-10-CM | POA: Diagnosis not present

## 2016-03-17 DIAGNOSIS — Z23 Encounter for immunization: Secondary | ICD-10-CM | POA: Diagnosis not present

## 2016-03-17 DIAGNOSIS — E1122 Type 2 diabetes mellitus with diabetic chronic kidney disease: Secondary | ICD-10-CM | POA: Diagnosis not present

## 2016-03-17 DIAGNOSIS — I10 Essential (primary) hypertension: Secondary | ICD-10-CM | POA: Diagnosis not present

## 2016-03-17 DIAGNOSIS — I25119 Atherosclerotic heart disease of native coronary artery with unspecified angina pectoris: Secondary | ICD-10-CM | POA: Diagnosis not present

## 2016-03-17 DIAGNOSIS — Z0000 Encounter for general adult medical examination without abnormal findings: Secondary | ICD-10-CM | POA: Diagnosis not present

## 2016-04-26 DIAGNOSIS — I25119 Atherosclerotic heart disease of native coronary artery with unspecified angina pectoris: Secondary | ICD-10-CM | POA: Diagnosis not present

## 2016-04-26 DIAGNOSIS — I10 Essential (primary) hypertension: Secondary | ICD-10-CM | POA: Diagnosis not present

## 2016-04-26 DIAGNOSIS — E782 Mixed hyperlipidemia: Secondary | ICD-10-CM | POA: Diagnosis not present

## 2016-04-26 DIAGNOSIS — N183 Chronic kidney disease, stage 3 (moderate): Secondary | ICD-10-CM | POA: Diagnosis not present

## 2016-06-17 IMAGING — CR DG CHEST 2V
1 series · 2 of 2 positions shown · non-contrast
Comparison: Chest radiograph August 04, 2015

CLINICAL DATA: Epigastric pain beginning yesterday evening. History
of gallstones and occurred, hypertension.

EXAM:
CHEST  2 VIEW

[Series 1: dg chest 2 view · 0.14mm/px · 2 of 2 slices shown]
[im 1/2]
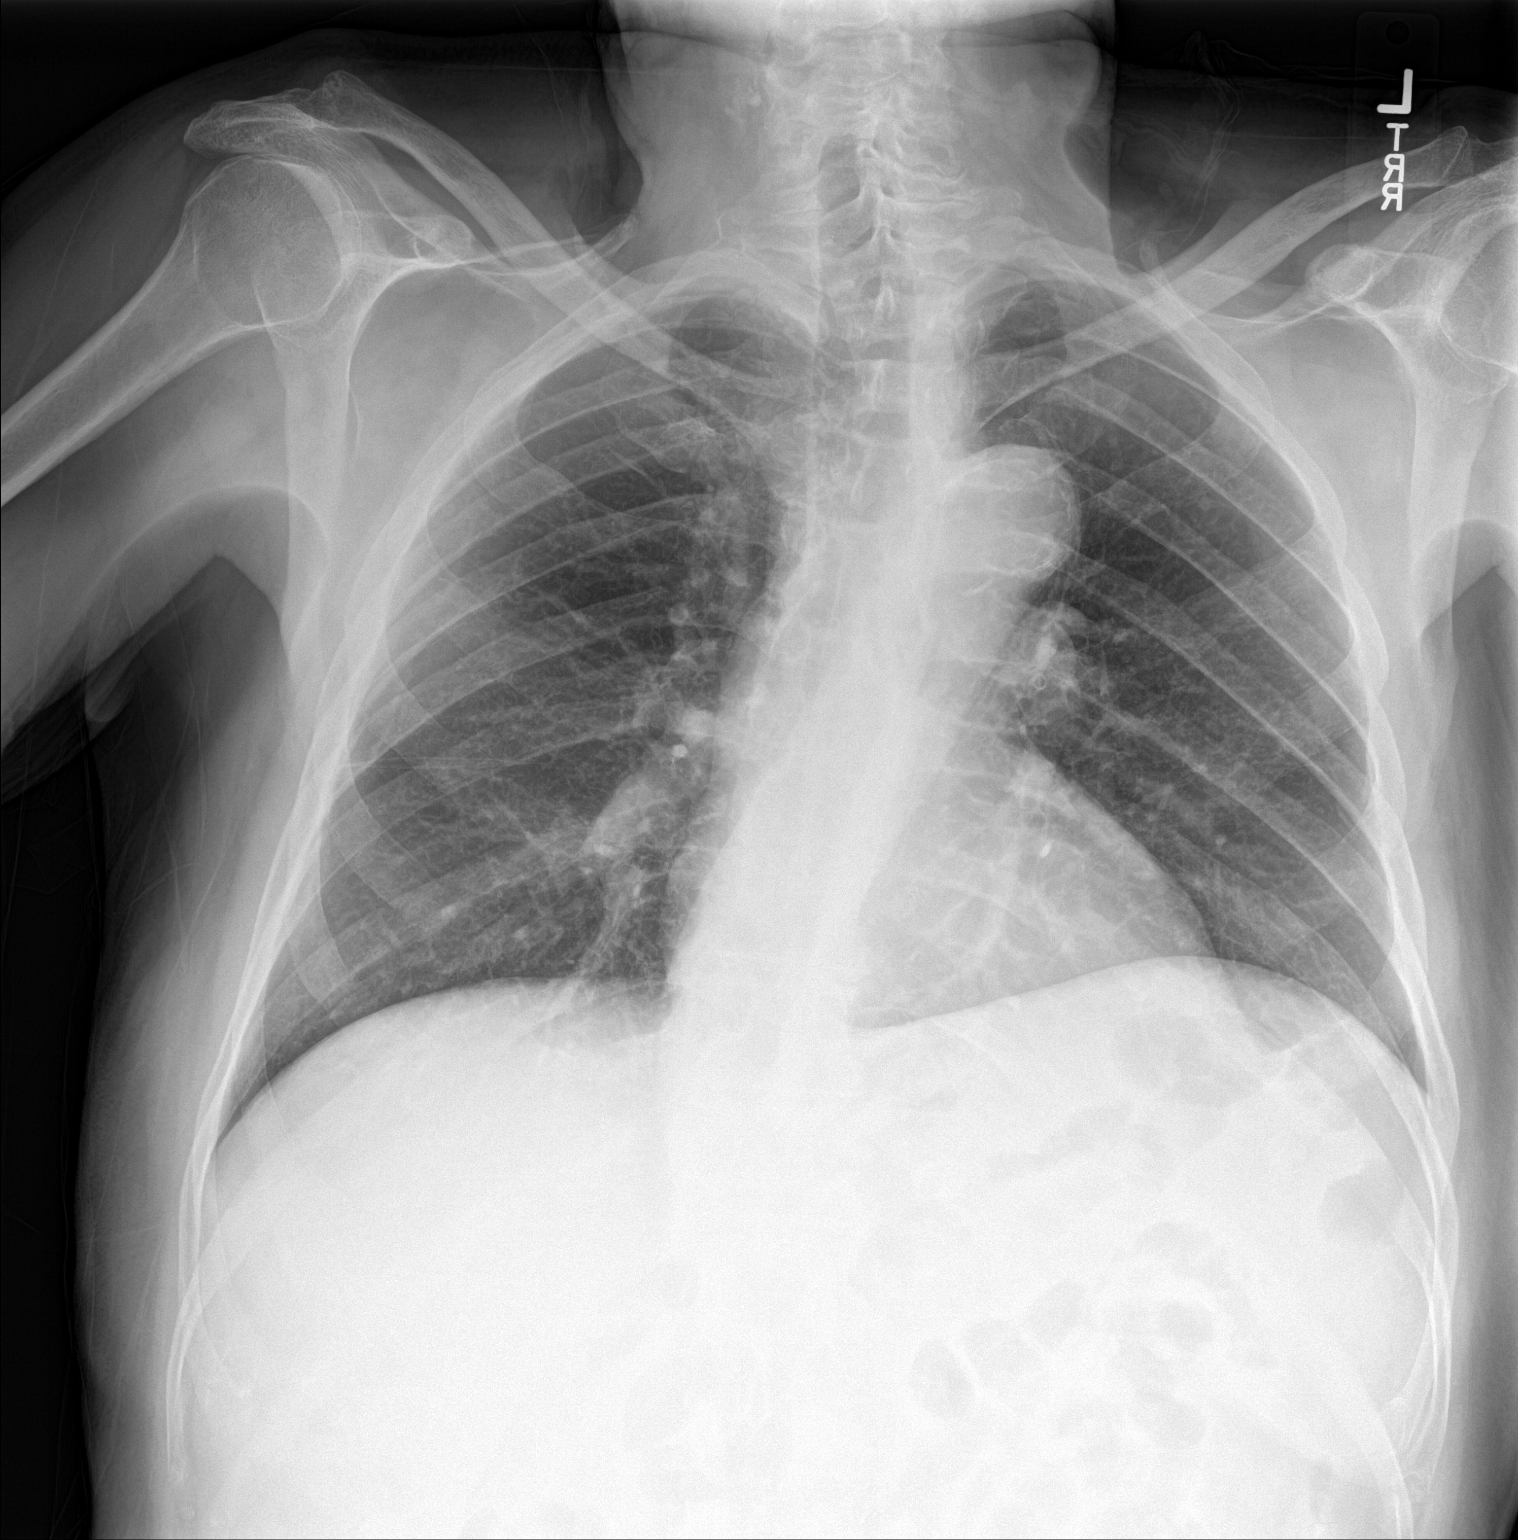
[im 2/2]
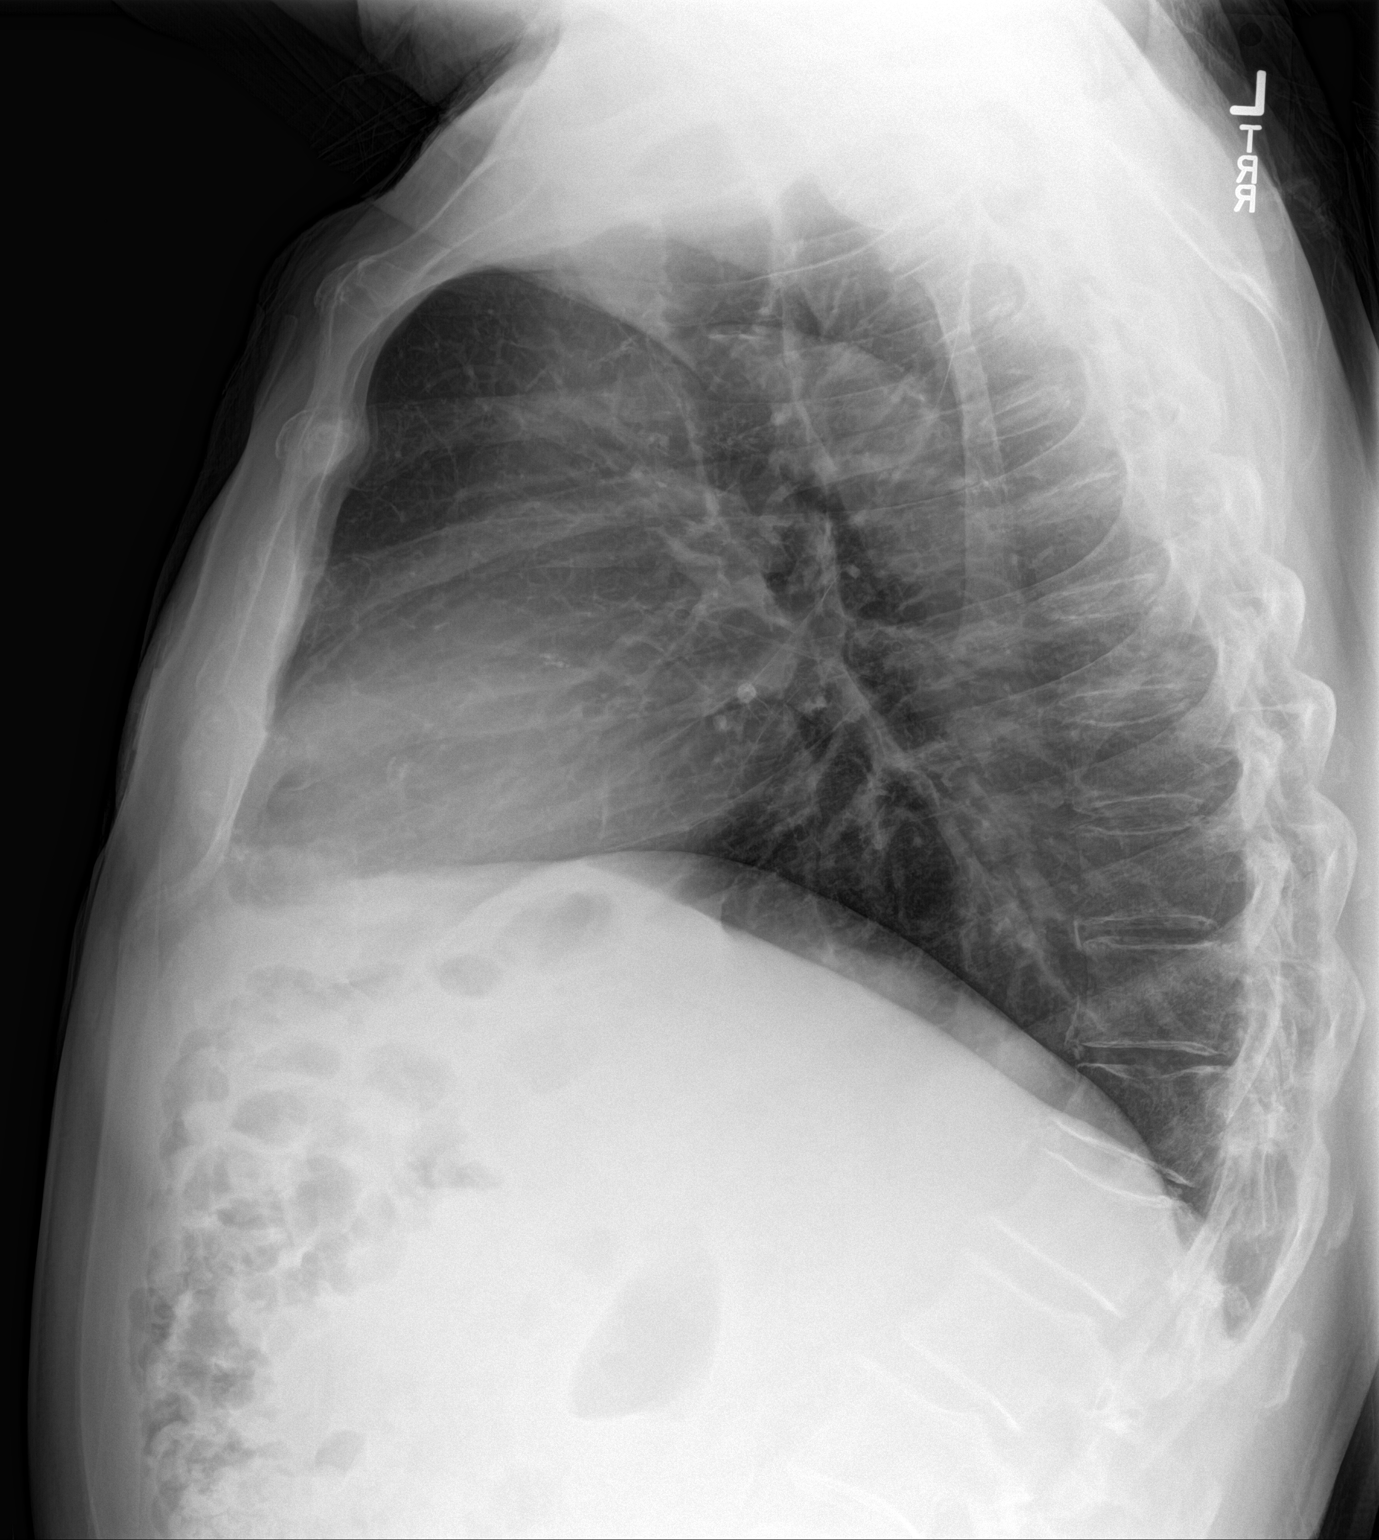

[2 of 2 positions shown; findings below may reference images not displayed]

FINDINGS: Cardiac silhouette is normal. Tortuous calcified aorta. No pleural
effusion or focal consolidation. No pneumothorax. Calcifications in
RIGHT neck are likely vascular. Mild chronic appearing T11 and T12
compression fracture with focal kyphosis. Osteopenia.
IMPRESSION: No acute cardiopulmonary process.

## 2016-06-21 IMAGING — CR DG CHOLANGIOGRAM OPERATIVE
10 of 12 series · 14 of 26 positions shown · non-contrast
Comparison: Abdominal ultrasound - 08/04/2015

CLINICAL DATA: Laparoscopic cholecystectomy.

EXAM:
INTRAOPERATIVE CHOLANGIOGRAM
FLUOROSCOPY TIME:  6 seconds

[Series 1: cont. · 2 of 3 frames shown (1 of 7)]
[frame 1/3]
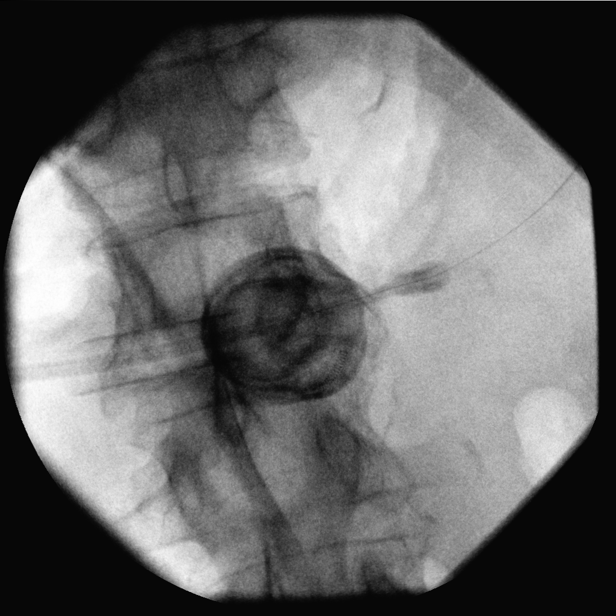
[frame 3/3]
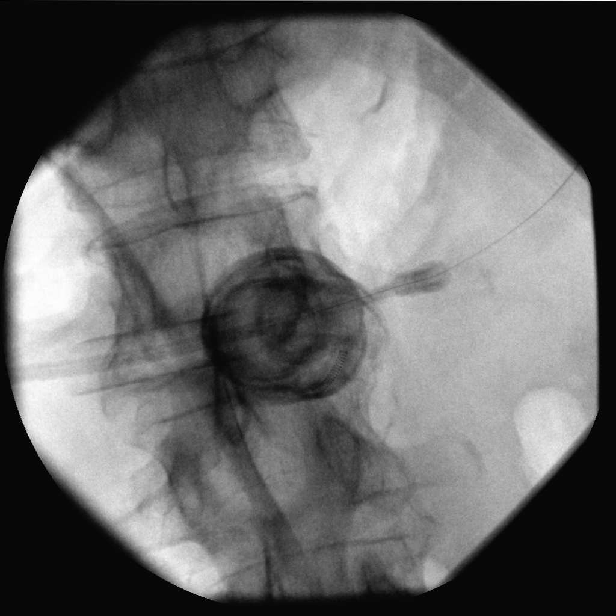

[cont. (2 of 7)]
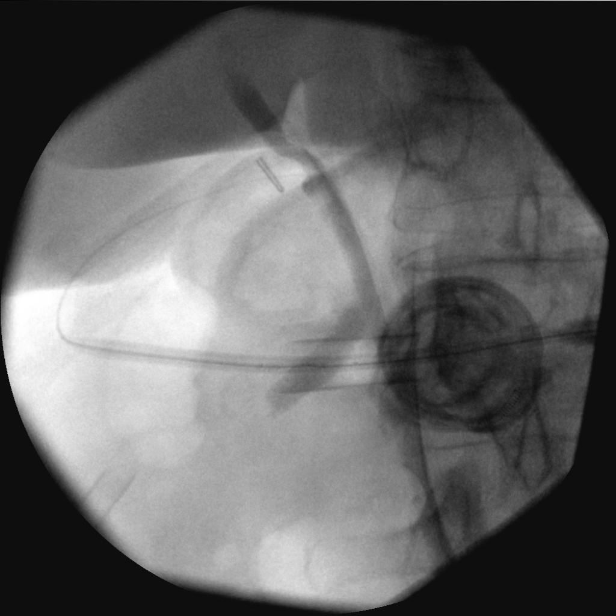

[Series 3: cont. · 2 of 3 frames shown (3 of 7)]
[frame 1/3]
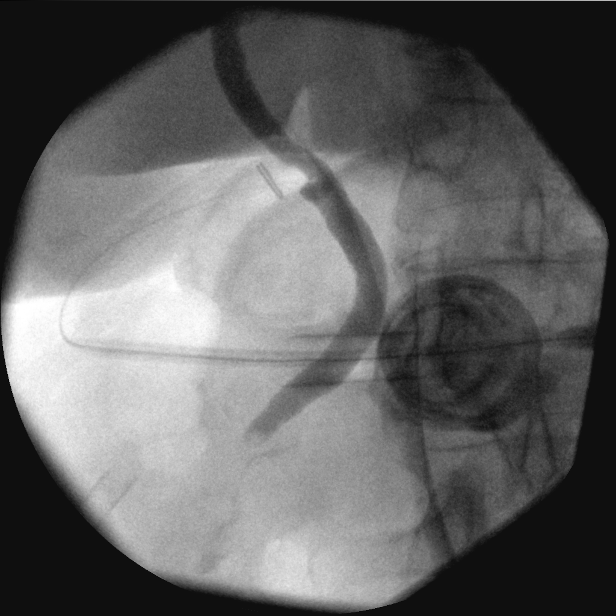
[frame 3/3]
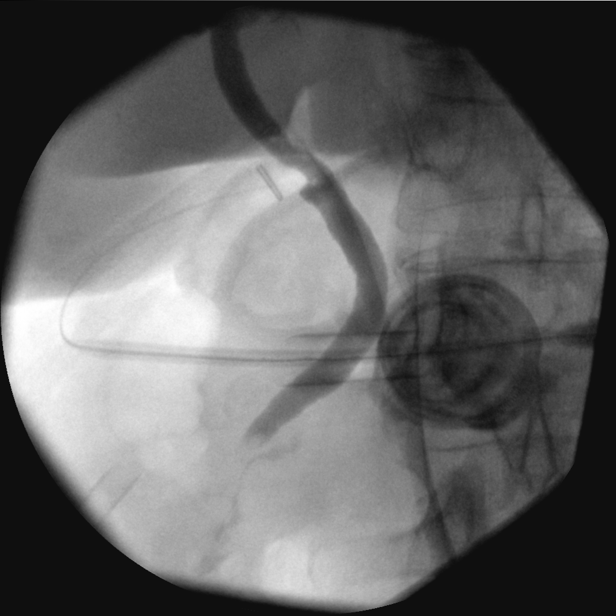

[cont. (4 of 7)]
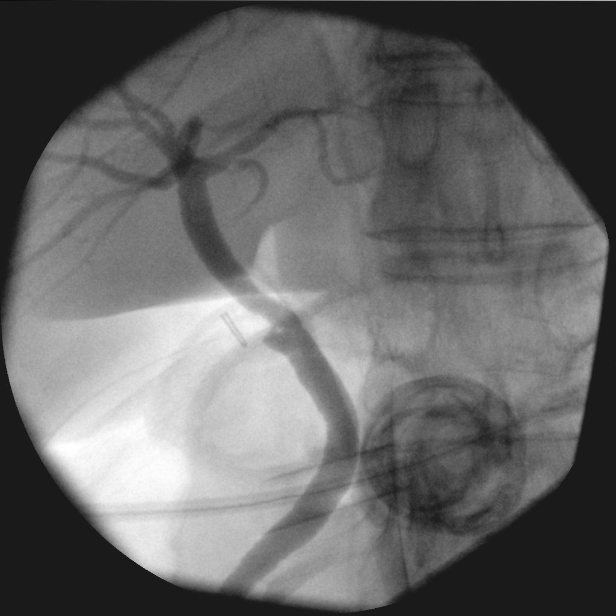

[Series 5: cont. · 2 of 3 frames shown (5 of 7)]
[frame 1/3]
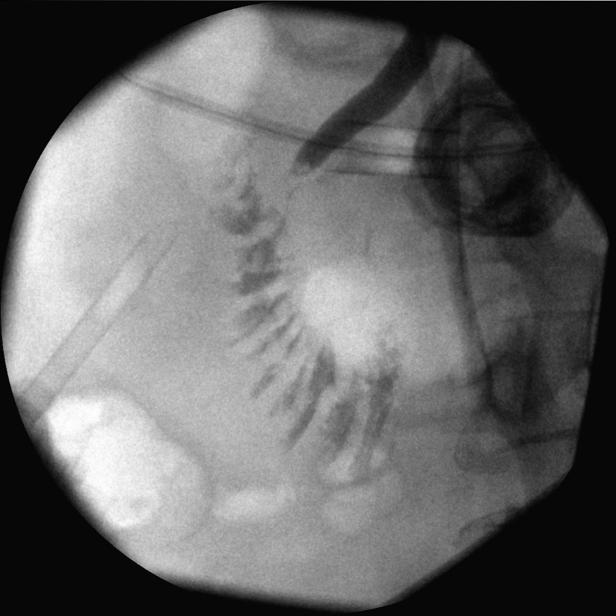
[frame 2/3]
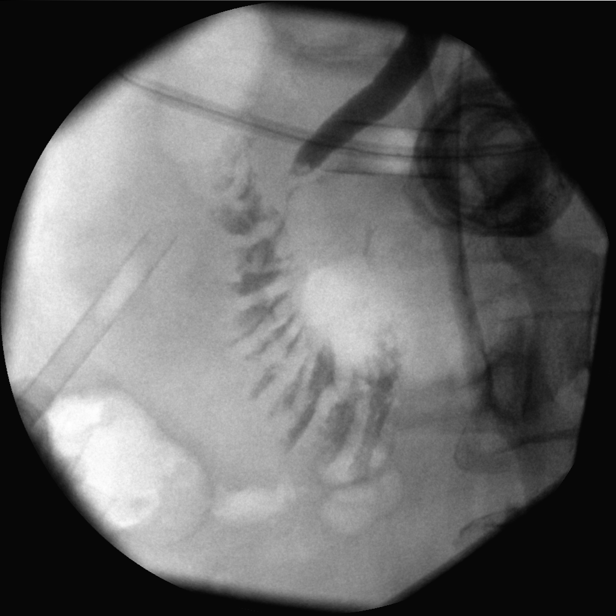

[Series 6: cont. · 2 of 3 frames shown (6 of 7)]
[frame 1/3]
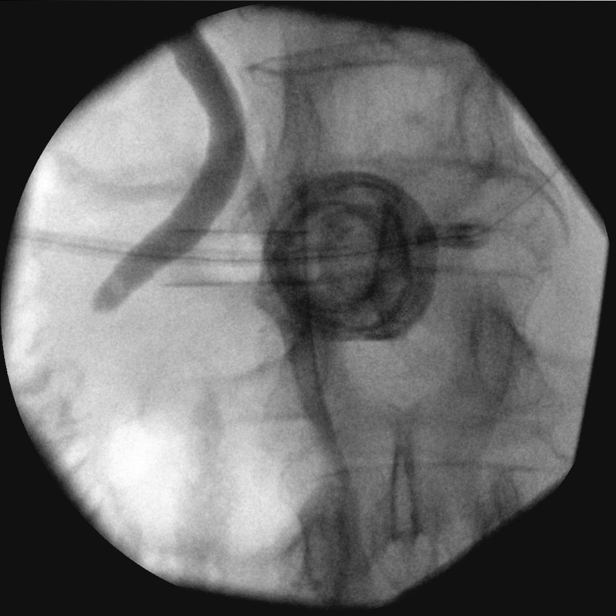
[frame 3/3]
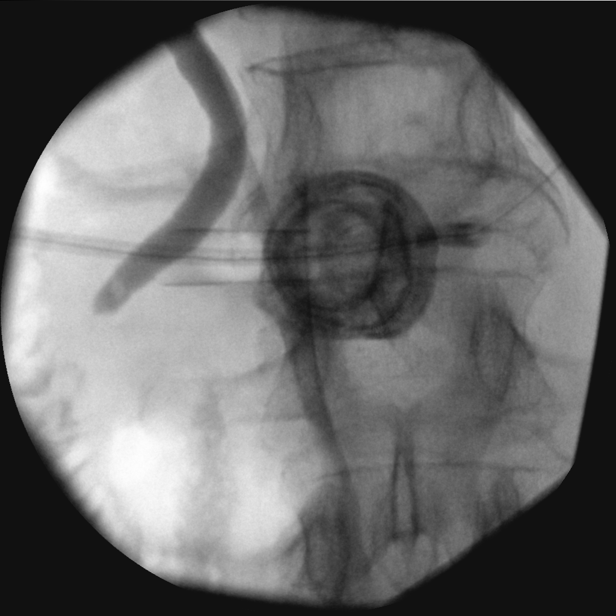

[cont. (7 of 7)]
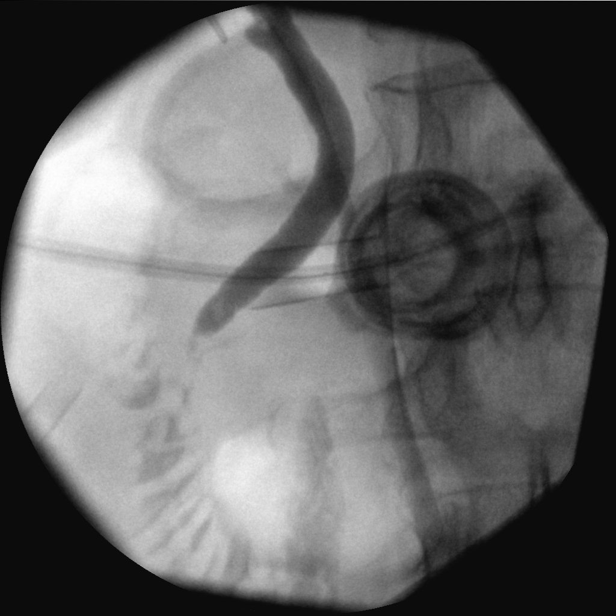

[s1]
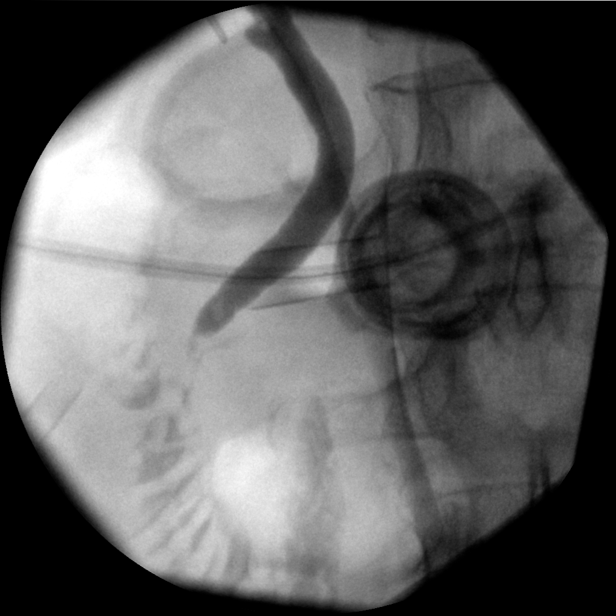

[s3]
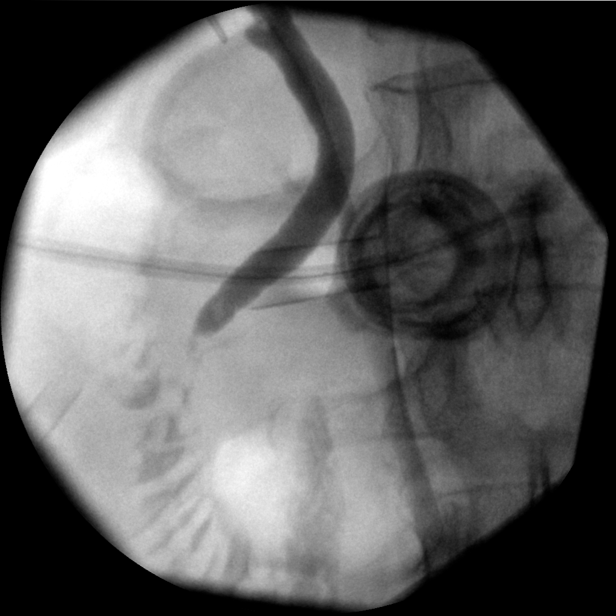

[s5]
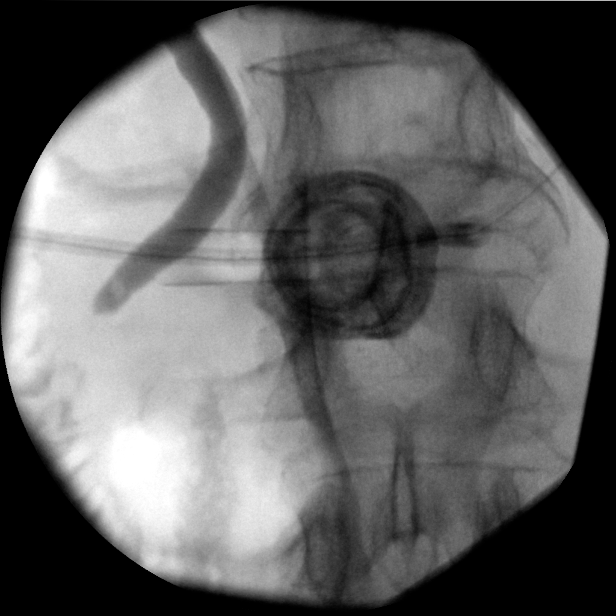

[14 of 26 positions shown; findings below may reference images not displayed]

FINDINGS: Intraoperative cholangiographic images of the right upper abdominal
quadrant during laparoscopic cholecystectomy are provided for
review.

Surgical clips overlie the expected location of the gallbladder
fossa.

Contrast injection demonstrates selective cannulation of the central
aspect of the cystic duct.

There is passage of contrast through the central aspect of the
cystic duct with filling of a non dilated common bile duct. There is
passage of contrast though the CBD and into the descending portion
of the duodenum.

There is minimal reflux of injected contrast into the common hepatic
duct and central aspect of the non dilated intrahepatic biliary
system.

There are persistent ill-defined nonocclusive filling defects in the
distal aspect of the CBD worrisome for choledocholithiasis.
IMPRESSION: Persistent ill-defined nonocclusive filling defects within the
distal aspect of the CBD worrisome for choledocholithiasis.
Correlation with the operative report is recommended.

## 2016-07-21 DIAGNOSIS — H40003 Preglaucoma, unspecified, bilateral: Secondary | ICD-10-CM | POA: Diagnosis not present

## 2016-08-10 DIAGNOSIS — Z85828 Personal history of other malignant neoplasm of skin: Secondary | ICD-10-CM | POA: Diagnosis not present

## 2016-08-10 DIAGNOSIS — L570 Actinic keratosis: Secondary | ICD-10-CM | POA: Diagnosis not present

## 2016-08-10 DIAGNOSIS — B001 Herpesviral vesicular dermatitis: Secondary | ICD-10-CM | POA: Diagnosis not present

## 2016-08-10 DIAGNOSIS — D485 Neoplasm of uncertain behavior of skin: Secondary | ICD-10-CM | POA: Diagnosis not present

## 2016-08-10 DIAGNOSIS — Z1283 Encounter for screening for malignant neoplasm of skin: Secondary | ICD-10-CM | POA: Diagnosis not present

## 2016-08-10 DIAGNOSIS — C44229 Squamous cell carcinoma of skin of left ear and external auricular canal: Secondary | ICD-10-CM | POA: Diagnosis not present

## 2016-08-10 DIAGNOSIS — Z08 Encounter for follow-up examination after completed treatment for malignant neoplasm: Secondary | ICD-10-CM | POA: Diagnosis not present

## 2016-08-29 DIAGNOSIS — Z23 Encounter for immunization: Secondary | ICD-10-CM | POA: Diagnosis not present

## 2016-08-31 DIAGNOSIS — C44229 Squamous cell carcinoma of skin of left ear and external auricular canal: Secondary | ICD-10-CM | POA: Diagnosis not present

## 2016-09-20 DIAGNOSIS — R351 Nocturia: Secondary | ICD-10-CM | POA: Diagnosis not present

## 2016-09-20 DIAGNOSIS — Z79899 Other long term (current) drug therapy: Secondary | ICD-10-CM | POA: Diagnosis not present

## 2016-09-20 DIAGNOSIS — E1122 Type 2 diabetes mellitus with diabetic chronic kidney disease: Secondary | ICD-10-CM | POA: Diagnosis not present

## 2016-09-20 DIAGNOSIS — N401 Enlarged prostate with lower urinary tract symptoms: Secondary | ICD-10-CM | POA: Diagnosis not present

## 2016-09-20 DIAGNOSIS — K219 Gastro-esophageal reflux disease without esophagitis: Secondary | ICD-10-CM | POA: Diagnosis not present

## 2016-09-20 DIAGNOSIS — E782 Mixed hyperlipidemia: Secondary | ICD-10-CM | POA: Diagnosis not present

## 2016-09-20 DIAGNOSIS — N183 Chronic kidney disease, stage 3 (moderate): Secondary | ICD-10-CM | POA: Diagnosis not present

## 2016-09-20 DIAGNOSIS — D61818 Other pancytopenia: Secondary | ICD-10-CM | POA: Diagnosis not present

## 2016-09-20 DIAGNOSIS — I25119 Atherosclerotic heart disease of native coronary artery with unspecified angina pectoris: Secondary | ICD-10-CM | POA: Diagnosis not present

## 2016-10-27 DIAGNOSIS — E782 Mixed hyperlipidemia: Secondary | ICD-10-CM | POA: Diagnosis not present

## 2016-10-27 DIAGNOSIS — R0789 Other chest pain: Secondary | ICD-10-CM | POA: Diagnosis not present

## 2016-10-27 DIAGNOSIS — I25119 Atherosclerotic heart disease of native coronary artery with unspecified angina pectoris: Secondary | ICD-10-CM | POA: Diagnosis not present

## 2016-10-27 DIAGNOSIS — I10 Essential (primary) hypertension: Secondary | ICD-10-CM | POA: Diagnosis not present

## 2016-10-27 DIAGNOSIS — E876 Hypokalemia: Secondary | ICD-10-CM | POA: Diagnosis not present

## 2016-11-02 DIAGNOSIS — R0789 Other chest pain: Secondary | ICD-10-CM | POA: Diagnosis not present

## 2016-11-29 DIAGNOSIS — M1711 Unilateral primary osteoarthritis, right knee: Secondary | ICD-10-CM | POA: Diagnosis not present

## 2016-11-29 DIAGNOSIS — M25561 Pain in right knee: Secondary | ICD-10-CM | POA: Diagnosis not present

## 2016-11-29 DIAGNOSIS — M25562 Pain in left knee: Secondary | ICD-10-CM | POA: Diagnosis not present

## 2017-02-02 DIAGNOSIS — M7050 Other bursitis of knee, unspecified knee: Secondary | ICD-10-CM | POA: Diagnosis not present

## 2017-02-02 DIAGNOSIS — M76899 Other specified enthesopathies of unspecified lower limb, excluding foot: Secondary | ICD-10-CM | POA: Diagnosis not present

## 2017-02-02 DIAGNOSIS — Z96652 Presence of left artificial knee joint: Secondary | ICD-10-CM | POA: Diagnosis not present

## 2017-03-13 DIAGNOSIS — K219 Gastro-esophageal reflux disease without esophagitis: Secondary | ICD-10-CM | POA: Diagnosis not present

## 2017-03-13 DIAGNOSIS — E1122 Type 2 diabetes mellitus with diabetic chronic kidney disease: Secondary | ICD-10-CM | POA: Diagnosis not present

## 2017-03-13 DIAGNOSIS — E782 Mixed hyperlipidemia: Secondary | ICD-10-CM | POA: Diagnosis not present

## 2017-03-13 DIAGNOSIS — N183 Chronic kidney disease, stage 3 (moderate): Secondary | ICD-10-CM | POA: Diagnosis not present

## 2017-03-13 DIAGNOSIS — D61818 Other pancytopenia: Secondary | ICD-10-CM | POA: Diagnosis not present

## 2017-03-13 DIAGNOSIS — N401 Enlarged prostate with lower urinary tract symptoms: Secondary | ICD-10-CM | POA: Diagnosis not present

## 2017-03-13 DIAGNOSIS — Z0000 Encounter for general adult medical examination without abnormal findings: Secondary | ICD-10-CM | POA: Diagnosis not present

## 2017-03-13 DIAGNOSIS — R351 Nocturia: Secondary | ICD-10-CM | POA: Diagnosis not present

## 2017-03-13 DIAGNOSIS — Z79899 Other long term (current) drug therapy: Secondary | ICD-10-CM | POA: Diagnosis not present

## 2017-03-13 DIAGNOSIS — I25119 Atherosclerotic heart disease of native coronary artery with unspecified angina pectoris: Secondary | ICD-10-CM | POA: Diagnosis not present

## 2017-03-13 DIAGNOSIS — I10 Essential (primary) hypertension: Secondary | ICD-10-CM | POA: Diagnosis not present

## 2017-04-25 DIAGNOSIS — I25119 Atherosclerotic heart disease of native coronary artery with unspecified angina pectoris: Secondary | ICD-10-CM | POA: Diagnosis not present

## 2017-04-25 DIAGNOSIS — I10 Essential (primary) hypertension: Secondary | ICD-10-CM | POA: Diagnosis not present

## 2017-07-18 DIAGNOSIS — I25119 Atherosclerotic heart disease of native coronary artery with unspecified angina pectoris: Secondary | ICD-10-CM | POA: Diagnosis not present

## 2017-07-18 DIAGNOSIS — D61818 Other pancytopenia: Secondary | ICD-10-CM | POA: Diagnosis not present

## 2017-07-18 DIAGNOSIS — N183 Chronic kidney disease, stage 3 (moderate): Secondary | ICD-10-CM | POA: Diagnosis not present

## 2017-07-18 DIAGNOSIS — K219 Gastro-esophageal reflux disease without esophagitis: Secondary | ICD-10-CM | POA: Diagnosis not present

## 2017-07-18 DIAGNOSIS — I10 Essential (primary) hypertension: Secondary | ICD-10-CM | POA: Diagnosis not present

## 2017-07-18 DIAGNOSIS — R1084 Generalized abdominal pain: Secondary | ICD-10-CM | POA: Diagnosis not present

## 2017-07-18 DIAGNOSIS — R634 Abnormal weight loss: Secondary | ICD-10-CM | POA: Diagnosis not present

## 2017-07-26 DIAGNOSIS — R634 Abnormal weight loss: Secondary | ICD-10-CM | POA: Diagnosis not present

## 2017-07-26 DIAGNOSIS — R1013 Epigastric pain: Secondary | ICD-10-CM | POA: Diagnosis not present

## 2017-08-14 DIAGNOSIS — M720 Palmar fascial fibromatosis [Dupuytren]: Secondary | ICD-10-CM | POA: Diagnosis not present

## 2017-08-14 DIAGNOSIS — Z859 Personal history of malignant neoplasm, unspecified: Secondary | ICD-10-CM | POA: Diagnosis not present

## 2017-08-14 DIAGNOSIS — L578 Other skin changes due to chronic exposure to nonionizing radiation: Secondary | ICD-10-CM | POA: Diagnosis not present

## 2017-08-14 DIAGNOSIS — Z872 Personal history of diseases of the skin and subcutaneous tissue: Secondary | ICD-10-CM | POA: Diagnosis not present

## 2017-08-14 DIAGNOSIS — L720 Epidermal cyst: Secondary | ICD-10-CM | POA: Diagnosis not present

## 2017-08-14 DIAGNOSIS — L570 Actinic keratosis: Secondary | ICD-10-CM | POA: Diagnosis not present

## 2017-08-28 ENCOUNTER — Encounter: Payer: Self-pay | Admitting: *Deleted

## 2017-08-29 ENCOUNTER — Encounter
Admission: RE | Disposition: A | Discharge status: Home / Self Care | Payer: Self-pay | Source: Ambulatory Visit | Attending: Internal Medicine

## 2017-08-29 ENCOUNTER — Ambulatory Visit: Payer: PPO | Admitting: Anesthesiology

## 2017-08-29 ENCOUNTER — Ambulatory Visit
Admission: RE | Admit: 2017-08-29 | Discharge: 2017-08-29 | Disposition: A | Discharge status: Home / Self Care | Payer: PPO | Source: Ambulatory Visit | Attending: Internal Medicine | Admitting: Internal Medicine

## 2017-08-29 ENCOUNTER — Encounter: Payer: Self-pay | Admitting: *Deleted

## 2017-08-29 DIAGNOSIS — I252 Old myocardial infarction: Secondary | ICD-10-CM | POA: Diagnosis not present

## 2017-08-29 DIAGNOSIS — N189 Chronic kidney disease, unspecified: Secondary | ICD-10-CM | POA: Diagnosis not present

## 2017-08-29 DIAGNOSIS — E1122 Type 2 diabetes mellitus with diabetic chronic kidney disease: Secondary | ICD-10-CM | POA: Diagnosis not present

## 2017-08-29 DIAGNOSIS — N183 Chronic kidney disease, stage 3 (moderate): Secondary | ICD-10-CM | POA: Insufficient documentation

## 2017-08-29 DIAGNOSIS — E785 Hyperlipidemia, unspecified: Secondary | ICD-10-CM | POA: Diagnosis not present

## 2017-08-29 DIAGNOSIS — I129 Hypertensive chronic kidney disease with stage 1 through stage 4 chronic kidney disease, or unspecified chronic kidney disease: Secondary | ICD-10-CM | POA: Diagnosis not present

## 2017-08-29 DIAGNOSIS — Z7982 Long term (current) use of aspirin: Secondary | ICD-10-CM | POA: Diagnosis not present

## 2017-08-29 DIAGNOSIS — R634 Abnormal weight loss: Secondary | ICD-10-CM | POA: Diagnosis not present

## 2017-08-29 DIAGNOSIS — K2950 Unspecified chronic gastritis without bleeding: Secondary | ICD-10-CM | POA: Diagnosis not present

## 2017-08-29 DIAGNOSIS — Z87891 Personal history of nicotine dependence: Secondary | ICD-10-CM | POA: Insufficient documentation

## 2017-08-29 DIAGNOSIS — K449 Diaphragmatic hernia without obstruction or gangrene: Secondary | ICD-10-CM | POA: Insufficient documentation

## 2017-08-29 DIAGNOSIS — K222 Esophageal obstruction: Secondary | ICD-10-CM | POA: Diagnosis not present

## 2017-08-29 DIAGNOSIS — Z79899 Other long term (current) drug therapy: Secondary | ICD-10-CM | POA: Insufficient documentation

## 2017-08-29 DIAGNOSIS — K219 Gastro-esophageal reflux disease without esophagitis: Secondary | ICD-10-CM | POA: Insufficient documentation

## 2017-08-29 DIAGNOSIS — R1013 Epigastric pain: Secondary | ICD-10-CM | POA: Diagnosis not present

## 2017-08-29 DIAGNOSIS — Z9049 Acquired absence of other specified parts of digestive tract: Secondary | ICD-10-CM | POA: Insufficient documentation

## 2017-08-29 DIAGNOSIS — K2970 Gastritis, unspecified, without bleeding: Secondary | ICD-10-CM | POA: Diagnosis not present

## 2017-08-29 DIAGNOSIS — Z6823 Body mass index (BMI) 23.0-23.9, adult: Secondary | ICD-10-CM | POA: Insufficient documentation

## 2017-08-29 DIAGNOSIS — K319 Disease of stomach and duodenum, unspecified: Secondary | ICD-10-CM | POA: Diagnosis not present

## 2017-08-29 DIAGNOSIS — I2510 Atherosclerotic heart disease of native coronary artery without angina pectoris: Secondary | ICD-10-CM | POA: Insufficient documentation

## 2017-08-29 DIAGNOSIS — K317 Polyp of stomach and duodenum: Secondary | ICD-10-CM | POA: Insufficient documentation

## 2017-08-29 DIAGNOSIS — R630 Anorexia: Secondary | ICD-10-CM | POA: Insufficient documentation

## 2017-08-29 DIAGNOSIS — K2960 Other gastritis without bleeding: Secondary | ICD-10-CM | POA: Diagnosis not present

## 2017-08-29 DIAGNOSIS — D131 Benign neoplasm of stomach: Secondary | ICD-10-CM | POA: Diagnosis not present

## 2017-08-29 DIAGNOSIS — K259 Gastric ulcer, unspecified as acute or chronic, without hemorrhage or perforation: Secondary | ICD-10-CM | POA: Diagnosis not present

## 2017-08-29 HISTORY — PX: ESOPHAGOGASTRODUODENOSCOPY (EGD) WITH PROPOFOL: SHX5813

## 2017-08-29 LAB — CBC WITH DIFFERENTIAL/PLATELET
Basophils Absolute: 0 10*3/uL (ref 0–0.1)
Basophils Relative: 1 %
EOS ABS: 0.1 10*3/uL (ref 0–0.7)
EOS PCT: 1 %
HCT: 44.3 % (ref 40.0–52.0)
Hemoglobin: 15.6 g/dL (ref 13.0–18.0)
LYMPHS ABS: 1.6 10*3/uL (ref 1.0–3.6)
Lymphocytes Relative: 34 %
MCH: 29.7 pg (ref 26.0–34.0)
MCHC: 35.1 g/dL (ref 32.0–36.0)
MCV: 84.6 fL (ref 80.0–100.0)
MONO ABS: 0.4 10*3/uL (ref 0.2–1.0)
MONOS PCT: 8 %
Neutro Abs: 2.7 10*3/uL (ref 1.4–6.5)
Neutrophils Relative %: 56 %
PLATELETS: 114 10*3/uL — AB (ref 150–440)
RBC: 5.24 MIL/uL (ref 4.40–5.90)
RDW: 13.8 % (ref 11.5–14.5)
WBC: 4.7 10*3/uL (ref 3.8–10.6)

## 2017-08-29 LAB — PROTIME-INR
INR: 1
PROTHROMBIN TIME: 13.1 s (ref 11.4–15.2)

## 2017-08-29 LAB — GLUCOSE, CAPILLARY: Glucose-Capillary: 141 mg/dL — ABNORMAL HIGH (ref 65–99)

## 2017-08-29 SURGERY — ESOPHAGOGASTRODUODENOSCOPY (EGD) WITH PROPOFOL
Anesthesia: General

## 2017-08-29 MED ORDER — FENTANYL CITRATE (PF) 100 MCG/2ML IJ SOLN
INTRAMUSCULAR | Status: AC
  Filled 2017-08-29: qty 2

## 2017-08-29 MED ORDER — PROPOFOL 500 MG/50ML IV EMUL
INTRAVENOUS | Status: AC
  Filled 2017-08-29: qty 50

## 2017-08-29 MED ORDER — PROPOFOL 500 MG/50ML IV EMUL
INTRAVENOUS | Status: DC | PRN
  Administered 2017-08-29: 150 ug/kg/min via INTRAVENOUS

## 2017-08-29 MED ORDER — LIDOCAINE HCL (CARDIAC) 20 MG/ML IV SOLN
INTRAVENOUS | Status: DC | PRN
  Administered 2017-08-29: 100 mg via INTRAVENOUS

## 2017-08-29 MED ORDER — FENTANYL CITRATE (PF) 100 MCG/2ML IJ SOLN
INTRAMUSCULAR | Status: DC | PRN
  Administered 2017-08-29: 50 ug via INTRAVENOUS

## 2017-08-29 MED ORDER — PROPOFOL 10 MG/ML IV BOLUS
INTRAVENOUS | Status: DC | PRN
  Administered 2017-08-29: 50 mg via INTRAVENOUS

## 2017-08-29 MED ORDER — SODIUM CHLORIDE 0.9 % IV SOLN
INTRAVENOUS | Status: DC
  Administered 2017-08-29: 14:00:00 via INTRAVENOUS

## 2017-08-29 NOTE — Interval H&P Note (Signed)
History and Physical Interval Note:  08/29/2017 2:01 PM  Trevor Deleon.  has presented today for surgery, with the diagnosis of WT LOSS DYSPEPSIA EPIGASTRIC PAIN  The various methods of treatment have been discussed with the patient and family. After consideration of risks, benefits and other options for treatment, the patient has consented to  Procedure(s): ESOPHAGOGASTRODUODENOSCOPY (EGD) WITH PROPOFOL (N/A) as a surgical intervention .  The patient's history has been reviewed, patient examined, no change in status, stable for surgery.  I have reviewed the patient's chart and labs.  Questions were answered to the patient's satisfaction.     [place], [place]

## 2017-08-29 NOTE — H&P (Signed)
Outpatient short stay form Pre-procedure 08/29/2017 1:58 PM Trevor Deleon K. Trevor Deleon, M.D.  Primary Physician: Trevor Deleon, M.D.  Reason for visit:  Dyspepsia, weight loss  History of present illness:  Pt with hx of weight loss, 25lb/76months, some anorexia. Patient presents for elective upper endoscopy. He denies any dysphagia. He does not drink alcohol. He has not had any recent abdominal imaging to further evaluate his weight loss and abdominal discomfort which has been diffuse.    Current Facility-Administered Medications:  .  0.9 %  sodium chloride infusion, , Intravenous, Continuous, Trevor Deleon, Trevor Pike, MD  Prescriptions Prior to Admission  Medication Sig Dispense Refill Last Dose  . acetaminophen (TYLENOL) 650 MG CR tablet Take 650 mg by mouth every 6 (six) hours as needed for pain.   Past Month at Unknown time  . ALPRAZolam (XANAX) 0.25 MG tablet Take 1 tablet by mouth at bedtime as needed for sleep.   3 Past Month at Unknown time  . aspirin EC 81 MG tablet Take 81 mg by mouth daily.   08/28/2017 at Unknown time  . docusate sodium (COLACE) 100 MG capsule Take 100 mg by mouth 2 (two) times daily.   08/28/2017 at Unknown time  . doxazosin (CARDURA) 4 MG tablet Take 4 mg by mouth daily.   08/28/2017 at Unknown time  . isosorbide mononitrate (IMDUR) 30 MG 24 hr tablet Take 30 mg by mouth daily.   08/29/2017 at 0600  . meclizine (ANTIVERT) 25 MG tablet Take 25 mg by mouth every 6 (six) hours as needed for dizziness. Reported on 12/15/2015   Not Taking at Unknown time  . Multiple Vitamin (MULTIVITAMIN) tablet Take 1 tablet by mouth daily.   08/28/2017 at Unknown time  . nitroGLYCERIN (NITROSTAT) 0.4 MG SL tablet Place 0.4 mg under the tongue. UP TO 3 DOSES   08/15/2015  . pantoprazole (PROTONIX) 40 MG tablet Take 40 mg by mouth daily.   08/28/2017 at Unknown time  . Polyethylene Glycol 3350 (MIRALAX PO) Take 17 g by mouth daily.    Past Week at Unknown time  . ranitidine (ZANTAC) 150 MG tablet Take 150  mg by mouth 2 (two) times daily.   12/14/2015 at Unknown time  . simvastatin (ZOCOR) 40 MG tablet Take 40 mg by mouth at bedtime.    08/28/2017 at Unknown time  . triamterene-hydrochlorothiazide (MAXZIDE-25) 37.5-25 MG per tablet Take 1 tablet by mouth daily.   08/29/2017 at 0600  . HYDROcodone-acetaminophen (NORCO) 5-325 MG tablet Take 1-2 tablets by mouth every 4 (four) hours as needed for moderate pain. (Patient not taking: Reported on 08/29/2017) 12 tablet 0 Not Taking at Unknown time  . HYDROcodone-acetaminophen (NORCO/VICODIN) 5-325 MG tablet Take 1 tablet by mouth every 4 (four) hours as needed for moderate pain. (Patient not taking: Reported on 08/29/2017) 12 tablet 0 Not Taking at Unknown time  . Omega-3 Fatty Acids (FISH OIL) 1000 MG CAPS Take 3,000 mg by mouth 2 (two) times daily.   Not Taking at Unknown time  . potassium chloride (K-DUR) 10 MEQ tablet Take 20 mEq by mouth daily.    12/14/2015 at Unknown time  . Psyllium (METAMUCIL PO) Take 1 Dose by mouth daily. Reported on 12/15/2015   Not Taking at Unknown time     No Known Allergies   Past Medical History:  Diagnosis Date  . Arthritis   . BPH (benign prostatic hyperplasia)   . CKD (chronic kidney disease) stage 3, GFR 30-59 ml/min (HCC)   . Coronary artery  disease   . Diabetes mellitus without complication ([place])   . Duodenum disorder    BRUNNERS GLAND  . ED (erectile dysfunction)   . GERD (gastroesophageal reflux disease)   . Hernia, femoral   . History of hiatal hernia   . Hyperlipidemia   . Hypertension   . Hypogonadism in male   . Labyrinthitis   . Myocardial infarction ([place])    1999  . Pancytopenia ([place])   . Stricture esophagus     Review of systems:      Physical Exam  General appearance: cooperative and appears stated age Resp: normal percussion bilaterally Cardio: regular rate and rhythm, S1, S2 normal, no murmur, click, rub or gallop GI: soft, non-tender; bowel sounds normal; no masses,  no  organomegaly     Planned procedures: EGD. The patient understands the nature of the planned procedure, indications, risks, alternatives and potential complications including but not limited to bleeding, infection, perforation, damage to internal organs and possible oversedation/side effects from anesthesia. The patient agrees and gives consent to proceed.  Please refer to procedure notes for findings, recommendations and patient disposition/instructions.    Trevor Deleon K. Trevor Deleon, M.D. Gastroenterology 08/29/2017  1:58 PM

## 2017-08-29 NOTE — Op Note (Signed)
[facility] Patient Name: Trevor Deleon Procedure Date: 08/29/2017 1:43 PM MRN: [id number] Account #: [id number] Date of Birth: Jul 30, 1932 Admit Type: Outpatient Age: 83 Room: [facility] ENDO ROOM 4 Gender: Male Note Status: Finalized Procedure:            Upper GI endoscopy Indications:          Epigastric abdominal pain, Dyspepsia, Weight loss Providers:            Benay Pike. [name] MD, MD Referring MD:         Christena Flake. Raechel Ache, MD (Referring MD) Medicines:            Propofol per Anesthesia Complications:        No immediate complications. Procedure:            Pre-Anesthesia Assessment:                       - ASA Grade Assessment: III - A patient with severe                        systemic disease.                       - After reviewing the risks and benefits, the patient                        was deemed in satisfactory condition to undergo the                        procedure.                       After obtaining informed consent, the endoscope was                        passed under direct vision. Throughout the procedure,                        the patient's blood pressure, pulse, and oxygen                        saturations were monitored continuously. The Endoscope                        was introduced through the mouth, and advanced to the                        third part of duodenum. The upper GI endoscopy was                        accomplished without difficulty. The patient tolerated                        the procedure well. Findings:      A non-obstructing Schatzki ring (acquired) was found at the       gastroesophageal junction.      A small hiatal hernia was present.      A few 5 mm sessile polyps with no bleeding and no stigmata of recent       bleeding were found on the greater curvature of the gastric antrum.       Biopsies were  taken with a cold forceps for histology.      Diffuse mildly erythematous mucosa without bleeding  was found in the       stomach. Biopsies were taken with a cold forceps for Helicobacter pylori       testing.      The examined duodenum was normal. Biopsies for histology were taken with       a cold forceps for evaluation of celiac disease. Impression:           - Non-obstructing Schatzki ring.                       - Small hiatal hernia.                       - A few gastric polyps. Biopsied.                       - Erythematous mucosa in the stomach. Biopsied.                       - Normal examined duodenum. Biopsied. Recommendation:       - Patient has a contact number available for                        emergencies. The signs and symptoms of potential                        delayed complications were discussed with the patient.                        Return to normal activities tomorrow. Written discharge                        instructions were provided to the patient.                       - Resume previous diet.                       - Continue present medications.                       - Await pathology results.                       - Return to GI office as previously scheduled.                       - The findings and recommendations were discussed with                        the patient.                       - The findings and recommendations were discussed with                        the patient's family. Procedure Code(s):    --- Professional ---                       [phone], Esophagogastroduodenoscopy, flexible, transoral;  with biopsy, single or multiple Diagnosis Code(s):    --- Professional ---                       K22.2, Esophageal obstruction                       K44.9, Diaphragmatic hernia without obstruction or                        gangrene                       K31.7, Polyp of stomach and duodenum                       K31.89, Other diseases of stomach and duodenum                       R10.13, Epigastric pain                        R63.4, Abnormal weight loss CPT copyright 2016 American Medical Association. All rights reserved. The codes documented in this report are preliminary and upon coder review may  be revised to meet current compliance requirements. Efrain Sella MD, MD 08/29/2017 2:18:22 PM This report has been signed electronically. Number of Addenda: 0 Note Initiated On: 08/29/2017 1:43 PM      [facility]

## 2017-08-29 NOTE — Anesthesia Preprocedure Evaluation (Signed)
Anesthesia Evaluation  Patient identified by MRN, date of birth, ID band Patient awake    Reviewed: Allergy & Precautions, H&P , NPO status , Patient's Chart, lab work & pertinent test results, reviewed documented beta blocker date and time   Airway Mallampati: II   Neck ROM: full    Dental  (+) Poor Dentition, Teeth Intact   Pulmonary neg pulmonary ROS, former smoker,    Pulmonary exam normal        Cardiovascular Exercise Tolerance: Good hypertension, + CAD, + Past MI and + Cardiac Stents  negative cardio ROS Normal cardiovascular exam Rhythm:regular Rate:Normal     Neuro/Psych negative neurological ROS  negative psych ROS   GI/Hepatic negative GI ROS, Neg liver ROS, hiatal hernia, GERD  Medicated,  Endo/Other  negative endocrine ROSdiabetes  Renal/GU CRFRenal diseasenegative Renal ROS  negative genitourinary   Musculoskeletal   Abdominal   Peds  Hematology negative hematology ROS (+)   Anesthesia Other Findings Past Medical History: No date: Arthritis No date: BPH (benign prostatic hyperplasia) No date: CKD (chronic kidney disease) stage 3, GFR 30-59 ml/min (HCC) No date: Coronary artery disease No date: Diabetes mellitus without complication (HCC) No date: Duodenum disorder     Comment:  BRUNNERS GLAND No date: ED (erectile dysfunction) No date: GERD (gastroesophageal reflux disease) No date: Hernia, femoral No date: History of hiatal hernia No date: Hyperlipidemia No date: Hypertension No date: Hypogonadism in male No date: Labyrinthitis No date: Myocardial infarction [facility])     Comment:  1999 No date: Pancytopenia ([place]) No date: Stricture esophagus Past Surgical History: No date: APPENDECTOMY No date: CARDIAC CATHETERIZATION No date: CARDIAC SURGERY 09/02/2015: CHOLECYSTECTOMY; N/A     Comment:  Procedure: LAPAROSCOPIC CHOLECYSTECTOMY;  Surgeon:               Leonie Green, MD;  Location:  [facility] ORS;  Service:               General;  Laterality: N/A; No date: COLONOSCOPY No date: CORONARY ANGIOPLASTY 09/03/2015: ERCP; Left     Comment:  Procedure: ENDOSCOPIC RETROGRADE               CHOLANGIOPANCREATOGRAPHY (ERCP);  Surgeon: Hulen Luster, MD;              Location[facility] ENDOSCOPY;  Service: Endoscopy;                Laterality: Left; No date: FLEXIBLE SIGMOIDOSCOPY No date: HERNIA REPAIR 12/15/2015: INGUINAL HERNIA REPAIR; Left     Comment:  Procedure: HERNIA REPAIR INGUINAL ADULT;  Surgeon:               Leonie Green, MD;  Location: [facility] ORS;  Service:               General;  Laterality: Left; 09/02/2015: INTRAOPERATIVE CHOLANGIOGRAM     Comment:  Procedure: INTRAOPERATIVE CHOLANGIOGRAM;  Surgeon:               Leonie Green, MD;  Location: [facility] ORS;  Service:               General;; No date: JOINT REPLACEMENT; Left No date: LIPOMA EXCISION 12/17/2014: PROSTATE SURGERY No date: TONSILLECTOMY BMI    Body Mass Index:  23.15 kg/m     Reproductive/Obstetrics negative OB ROS                             Anesthesia  Physical Anesthesia Plan  ASA: III  Anesthesia Plan: General   Post-op Pain Management:    Induction:   PONV Risk Score and Plan:   Airway Management Planned:   Additional Equipment:   Intra-op Plan:   Post-operative Plan:   Informed Consent: I have reviewed the patients History and Physical, chart, labs and discussed the procedure including the risks, benefits and alternatives for the proposed anesthesia with the patient or authorized representative who has indicated his/her understanding and acceptance.   Dental Advisory Given  Plan Discussed with: CRNA  Anesthesia Plan Comments:         Anesthesia Quick Evaluation

## 2017-08-29 NOTE — Anesthesia Procedure Notes (Signed)
Date/Time: 08/29/2017 2:15 PM Performed by: Allean Found Pre-anesthesia Checklist: Patient identified, Emergency Drugs available, Suction available, Patient being monitored and Timeout performed Patient Re-evaluated:Patient Re-evaluated prior to induction Oxygen Delivery Method: Nasal cannula Placement Confirmation: positive ETCO2 Dental Injury: Teeth and Oropharynx as per pre-operative assessment

## 2017-08-29 NOTE — Transfer of Care (Signed)
Immediate Anesthesia Transfer of Care Note  Patient: Trevor Deleon.  Procedure(s) Performed: ESOPHAGOGASTRODUODENOSCOPY (EGD) WITH PROPOFOL (N/A )  Patient Location: PACU  Anesthesia Type:General  Level of Consciousness: awake  Airway & Oxygen Therapy: Patient Spontanous Breathing and Patient connected to nasal cannula oxygen  Post-op Assessment: Report given to RN and Post -op Vital signs reviewed and stable  Post vital signs: Reviewed and stable  Last Vitals:  Vitals:   08/29/17 1232  BP: 126/84  Pulse: 80  Resp: 18  Temp: 36.4 C  SpO2: 100%    Last Pain:  Vitals:   08/29/17 1232  TempSrc: Tympanic         Complications: No apparent anesthesia complications

## 2017-08-29 NOTE — Anesthesia Post-op Follow-up Note (Signed)
Anesthesia QCDR form completed.        

## 2017-08-30 ENCOUNTER — Encounter: Payer: Self-pay | Admitting: Internal Medicine

## 2017-08-30 NOTE — Anesthesia Postprocedure Evaluation (Signed)
Anesthesia Post Note  Patient: Lonney Revak.  Procedure(s) Performed: ESOPHAGOGASTRODUODENOSCOPY (EGD) WITH PROPOFOL (N/A )  Patient location during evaluation: PACU Anesthesia Type: General Level of consciousness: awake and alert Pain management: pain level controlled Vital Signs Assessment: post-procedure vital signs reviewed and stable Respiratory status: spontaneous breathing, nonlabored ventilation, respiratory function stable and patient connected to nasal cannula oxygen Cardiovascular status: blood pressure returned to baseline and stable Postop Assessment: no apparent nausea or vomiting Anesthetic complications: no     Last Vitals:  Vitals:   08/29/17 1444 08/29/17 1454  BP: 125/79 (!) 152/64  Pulse: (!) 56 (!) 59  Resp: 15 16  Temp:    SpO2: 99% 98%    Last Pain:  Vitals:   08/30/17 0712  TempSrc:   PainSc: 0-No pain                 Molli Barrows

## 2017-09-01 LAB — SURGICAL PATHOLOGY

## 2017-09-12 DIAGNOSIS — Z23 Encounter for immunization: Secondary | ICD-10-CM | POA: Diagnosis not present

## 2017-09-12 DIAGNOSIS — T502X5A Adverse effect of carbonic-anhydrase inhibitors, benzothiadiazides and other diuretics, initial encounter: Secondary | ICD-10-CM | POA: Diagnosis not present

## 2017-09-12 DIAGNOSIS — Z79899 Other long term (current) drug therapy: Secondary | ICD-10-CM | POA: Diagnosis not present

## 2017-09-12 DIAGNOSIS — E1122 Type 2 diabetes mellitus with diabetic chronic kidney disease: Secondary | ICD-10-CM | POA: Diagnosis not present

## 2017-09-12 DIAGNOSIS — I25119 Atherosclerotic heart disease of native coronary artery with unspecified angina pectoris: Secondary | ICD-10-CM | POA: Diagnosis not present

## 2017-09-12 DIAGNOSIS — K219 Gastro-esophageal reflux disease without esophagitis: Secondary | ICD-10-CM | POA: Diagnosis not present

## 2017-09-12 DIAGNOSIS — D61818 Other pancytopenia: Secondary | ICD-10-CM | POA: Diagnosis not present

## 2017-09-12 DIAGNOSIS — N401 Enlarged prostate with lower urinary tract symptoms: Secondary | ICD-10-CM | POA: Diagnosis not present

## 2017-09-12 DIAGNOSIS — N183 Chronic kidney disease, stage 3 (moderate): Secondary | ICD-10-CM | POA: Diagnosis not present

## 2017-09-12 DIAGNOSIS — I10 Essential (primary) hypertension: Secondary | ICD-10-CM | POA: Diagnosis not present

## 2017-09-12 DIAGNOSIS — E876 Hypokalemia: Secondary | ICD-10-CM | POA: Diagnosis not present

## 2017-09-12 DIAGNOSIS — E782 Mixed hyperlipidemia: Secondary | ICD-10-CM | POA: Diagnosis not present

## 2017-09-23 ENCOUNTER — Ambulatory Visit
Admission: EM | Admit: 2017-09-23 | Discharge: 2017-09-23 | Disposition: A | Discharge status: Home / Self Care | Payer: PPO | Attending: Emergency Medicine | Admitting: Emergency Medicine

## 2017-09-23 ENCOUNTER — Encounter: Payer: Self-pay | Admitting: Emergency Medicine

## 2017-09-23 DIAGNOSIS — H1132 Conjunctival hemorrhage, left eye: Secondary | ICD-10-CM | POA: Diagnosis not present

## 2017-09-23 DIAGNOSIS — S0502XA Injury of conjunctiva and corneal abrasion without foreign body, left eye, initial encounter: Secondary | ICD-10-CM | POA: Diagnosis not present

## 2017-09-23 DIAGNOSIS — S0592XA Unspecified injury of left eye and orbit, initial encounter: Secondary | ICD-10-CM | POA: Diagnosis not present

## 2017-09-23 DIAGNOSIS — S0590XA Unspecified injury of unspecified eye and orbit, initial encounter: Secondary | ICD-10-CM

## 2017-09-23 MED ORDER — CIPROFLOXACIN HCL 0.3 % OP SOLN: OPHTHALMIC | 0 refills | Status: DC

## 2017-09-23 MED ORDER — POLYETHYL GLYCOL-PROPYL GLYCOL 0.4-0.3 % OP SOLN: 1.0000 [drp] | Freq: Four times a day (QID) | OPHTHALMIC | 0 refills | Status: DC | PRN

## 2017-09-23 NOTE — ED Triage Notes (Signed)
Patient states that he was riding his mower today and a rock flew up and hit him in his left eye.  Patient has redness and drainage from his left eye.

## 2017-09-23 NOTE — ED Provider Notes (Signed)
HPI  SUBJECTIVE:  Trevor Deleon. is a 83 y.o. male who presents with left eye trauma.  States that he was riding his lawnmower and a rock hit his left eye earlier today.  He reports bleeding which stopped on its own.  Reports cloudy vision while the eye was bleeding but states that is completely cleared now.  He denies drainage, increased tearing, photophobia, nausea, vomiting, headache, halos around lights, blurry or double vision, eye pain, foreign body sensation.  There are no aggravating or alleviating factors.  He has not tried anything for this.  Tetanus is up-to-date.  He wears glasses for driving.  Does not wear contacts.  States that his vision is normally worse on the right than the left.  Past medical history of hypertension, cataracts.  He is on 81 mg aspirin daily.  No history of diabetes, glaucoma, other antiplatelet or anticoagulant use.  PMD: Dr. Dorthula Perfect.  Ophthalmology: Dr. Wallace Going.    Past Medical History:  Diagnosis Date  . Arthritis   . BPH (benign prostatic hyperplasia)   . CKD (chronic kidney disease) stage 3, GFR 30-59 ml/min (HCC)   . Coronary artery disease   . Diabetes mellitus without complication ([place])   . Duodenum disorder    BRUNNERS GLAND  . ED (erectile dysfunction)   . GERD (gastroesophageal reflux disease)   . Hernia, femoral   . History of hiatal hernia   . Hyperlipidemia   . Hypertension   . Hypogonadism in male   . Labyrinthitis   . Myocardial infarction ([place])    1999  . Pancytopenia ([place])   . Stricture esophagus     Past Surgical History:  Procedure Laterality Date  . APPENDECTOMY    . CARDIAC CATHETERIZATION    . CARDIAC SURGERY    . CHOLECYSTECTOMY N/A 09/02/2015   Procedure: LAPAROSCOPIC CHOLECYSTECTOMY;  Surgeon: Leonie Green, MD;  Location: [facility] ORS;  Service: General;  Laterality: N/A;  . COLONOSCOPY    . CORONARY ANGIOPLASTY    . ERCP Left 09/03/2015   Procedure: ENDOSCOPIC RETROGRADE CHOLANGIOPANCREATOGRAPHY (ERCP);   Surgeon: Hulen Luster, MD;  Location[facility] ENDOSCOPY;  Service: Endoscopy;  Laterality: Left;  . ESOPHAGOGASTRODUODENOSCOPY (EGD) WITH PROPOFOL N/A 08/29/2017   Procedure: ESOPHAGOGASTRODUODENOSCOPY (EGD) WITH PROPOFOL;  Surgeon: Toledo, Benay Pike, MD;  Location: [facility] ENDOSCOPY;  Service: Gastroenterology;  Laterality: N/A;  . FLEXIBLE SIGMOIDOSCOPY    . HERNIA REPAIR    . INGUINAL HERNIA REPAIR Left 12/15/2015   Procedure: HERNIA REPAIR INGUINAL ADULT;  Surgeon: Leonie Green, MD;  Location: [facility] ORS;  Service: General;  Laterality: Left;  . INTRAOPERATIVE CHOLANGIOGRAM  09/02/2015   Procedure: INTRAOPERATIVE CHOLANGIOGRAM;  Surgeon: Leonie Green, MD;  Location: [facility] ORS;  Service: General;;  . JOINT REPLACEMENT Left   . LIPOMA EXCISION    . PROSTATE SURGERY  12/17/2014  . TONSILLECTOMY      Family History  Problem Relation Age of Onset  . Hypertension Mother   . Stroke Mother   . Prostate cancer Father   . Heart attack Father     Social History  Substance Use Topics  . Smoking status: Former Smoker    Packs/day: 1.00    Years: 15.00    Types: Cigarettes  . Smokeless tobacco: Never Used  . Alcohol use No    No current facility-administered medications for this encounter.   Current Outpatient Prescriptions:  .  acetaminophen (TYLENOL) 650 MG CR tablet, Take 650 mg by mouth every 6 (six) hours  as needed for pain., Disp: , Rfl:  .  ALPRAZolam (XANAX) 0.25 MG tablet, Take 1 tablet by mouth at bedtime as needed for sleep. , Disp: , Rfl: 3 .  aspirin EC 81 MG tablet, Take 81 mg by mouth daily., Disp: , Rfl:  .  ciprofloxacin (CILOXAN) 0.3 % ophthalmic solution, 1-2 drops in affected eye 4 times/day x 5 days, Disp: 5 mL, Rfl: 0 .  docusate sodium (COLACE) 100 MG capsule, Take 100 mg by mouth 2 (two) times daily., Disp: , Rfl:  .  doxazosin (CARDURA) 4 MG tablet, Take 4 mg by mouth daily., Disp: , Rfl:  .  isosorbide mononitrate (IMDUR) 30 MG 24 hr tablet, Take 30 mg by  mouth daily., Disp: , Rfl:  .  meclizine (ANTIVERT) 25 MG tablet, Take 25 mg by mouth every 6 (six) hours as needed for dizziness. Reported on 12/15/2015, Disp: , Rfl:  .  Multiple Vitamin (MULTIVITAMIN) tablet, Take 1 tablet by mouth daily., Disp: , Rfl:  .  nitroGLYCERIN (NITROSTAT) 0.4 MG SL tablet, Place 0.4 mg under the tongue. UP TO 3 DOSES, Disp: , Rfl:  .  Omega-3 Fatty Acids (FISH OIL) 1000 MG CAPS, Take 3,000 mg by mouth 2 (two) times daily., Disp: , Rfl:  .  pantoprazole (PROTONIX) 40 MG tablet, Take 40 mg by mouth daily., Disp: , Rfl:  .  Polyethyl Glycol-Propyl Glycol (SYSTANE) 0.4-0.3 % SOLN, Apply 1 drop to eye 4 (four) times daily as needed., Disp: 5 mL, Rfl: 0 .  Polyethylene Glycol 3350 (MIRALAX PO), Take 17 g by mouth daily. , Disp: , Rfl:  .  potassium chloride (K-DUR) 10 MEQ tablet, Take 20 mEq by mouth daily. , Disp: , Rfl:  .  Psyllium (METAMUCIL PO), Take 1 Dose by mouth daily. Reported on 12/15/2015, Disp: , Rfl:  .  ranitidine (ZANTAC) 150 MG tablet, Take 150 mg by mouth 2 (two) times daily., Disp: , Rfl:  .  simvastatin (ZOCOR) 40 MG tablet, Take 40 mg by mouth at bedtime. , Disp: , Rfl:  .  triamterene-hydrochlorothiazide (MAXZIDE-25) 37.5-25 MG per tablet, Take 1 tablet by mouth daily., Disp: , Rfl:   No Known Allergies   ROS  As noted in HPI.   Physical Exam  BP (!) 141/66 (BP Location: Left Arm)   Pulse 75   Temp 98 F (36.7 C) (Oral)   Resp 16   Ht 5\' 11"  (1.803 m)   Wt 170 lb (77.1 kg)   SpO2 100%   BMI 23.71 kg/m   Constitutional: Well developed, well nourished, no acute distress Eyes:  EOMI, PERRLA, no direct or consensual photophobia.  Large subconjunctival hemorrhage medial left eye.  No foreign body seen on lid eversion.  No corneal foreign body seen on magnification.  Small peripheral corneal abrasion in the 9 o'clock position on flourescin exam.  Negative Seidel.  No other obvious injury to the eyelid or tear duct.      Visual  Acuity  Right Eye Distance: corrected 20/30 Left Eye Distance: corrected 20 50 Bilateral Distance:    Right Eye Near:   Left Eye Near:    Bilateral Near:     HENT: Normocephalic, atraumatic,mucus membranes moist Respiratory: Normal inspiratory effort Cardiovascular: Normal rate GI: nondistended skin: No rash, skin intact Musculoskeletal: no deformities Neurologic: Alert & oriented x 3, no focal neuro deficits Psychiatric: Speech and behavior appropriate   ED Course   Medications - No data to display  No orders of the  defined types were placed in this encounter.   No results found for this or any previous visit (from the past 24 hour(s)). No results found.  ED Clinical Impression  Eye trauma  Subconjunctival hemorrhage of left eye  Abrasion of left cornea, initial encounter   ED Assessment/Plan  Visual acuity acceptable.  Patient has a left-sided traumatic subconjunctival hemorrhage and a small peripheral corneal abrasion.  Will send home with Ciloxin, Systane, warm or cool compresses, whichever one feels better.  He is to follow-up with Dr. Wallace Going in 3 days to ensure healing,  Discussed labs, imaging, MDM, plan and followup with patient. Discussed sn/sx that should prompt return to the ED. patient agrees with plan.   Meds ordered this encounter  Medications  . ciprofloxacin (CILOXAN) 0.3 % ophthalmic solution    Sig: 1-2 drops in affected eye 4 times/day x 5 days    Dispense:  5 mL    Refill:  0  . Polyethyl Glycol-Propyl Glycol (SYSTANE) 0.4-0.3 % SOLN    Sig: Apply 1 drop to eye 4 (four) times daily as needed.    Dispense:  5 mL    Refill:  0    *This clinic note was created using [employer]. Therefore, there may be occasional mistakes despite careful proofreading.   ?    Melynda Ripple, MD 09/23/17 1805

## 2017-09-23 NOTE — Discharge Instructions (Signed)
Ciloxin is an antibiotic eyedrop, use as prescribed. Systane is the lubricating eyedrop.  Use the systane as often as you want.  Warm or cool compresses, whichever one feels better.  follow-up with Dr. Wallace Going in 3 days to ensure healing.  Make sure you always wear eye protection when mowing the lawn.

## 2017-09-25 DIAGNOSIS — S0502XA Injury of conjunctiva and corneal abrasion without foreign body, left eye, initial encounter: Secondary | ICD-10-CM | POA: Diagnosis not present

## 2017-10-20 DIAGNOSIS — H2513 Age-related nuclear cataract, bilateral: Secondary | ICD-10-CM | POA: Diagnosis not present

## 2017-10-25 DIAGNOSIS — N183 Chronic kidney disease, stage 3 (moderate): Secondary | ICD-10-CM | POA: Diagnosis not present

## 2017-10-25 DIAGNOSIS — I10 Essential (primary) hypertension: Secondary | ICD-10-CM | POA: Diagnosis not present

## 2017-10-25 DIAGNOSIS — I25119 Atherosclerotic heart disease of native coronary artery with unspecified angina pectoris: Secondary | ICD-10-CM | POA: Diagnosis not present

## 2017-10-25 DIAGNOSIS — E782 Mixed hyperlipidemia: Secondary | ICD-10-CM | POA: Diagnosis not present

## 2017-10-25 DIAGNOSIS — R079 Chest pain, unspecified: Secondary | ICD-10-CM | POA: Diagnosis not present

## 2017-11-07 DIAGNOSIS — R079 Chest pain, unspecified: Secondary | ICD-10-CM | POA: Diagnosis not present

## 2017-11-23 ENCOUNTER — Other Ambulatory Visit: Payer: Self-pay | Admitting: Gastroenterology

## 2017-11-23 DIAGNOSIS — R634 Abnormal weight loss: Secondary | ICD-10-CM | POA: Diagnosis not present

## 2017-11-23 DIAGNOSIS — R1010 Upper abdominal pain, unspecified: Secondary | ICD-10-CM | POA: Diagnosis not present

## 2017-11-30 ENCOUNTER — Ambulatory Visit
Admission: RE | Admit: 2017-11-30 | Discharge: 2017-11-30 | Disposition: A | Discharge status: Home / Self Care | Payer: PPO | Source: Ambulatory Visit | Attending: Gastroenterology | Admitting: Gastroenterology

## 2017-11-30 ENCOUNTER — Ambulatory Visit: Admission: RE | Admit: 2017-11-30 | Payer: PPO | Source: Ambulatory Visit

## 2017-11-30 DIAGNOSIS — I25119 Atherosclerotic heart disease of native coronary artery with unspecified angina pectoris: Secondary | ICD-10-CM | POA: Insufficient documentation

## 2017-11-30 DIAGNOSIS — N400 Enlarged prostate without lower urinary tract symptoms: Secondary | ICD-10-CM | POA: Diagnosis not present

## 2017-11-30 DIAGNOSIS — R911 Solitary pulmonary nodule: Secondary | ICD-10-CM | POA: Diagnosis not present

## 2017-11-30 DIAGNOSIS — I700 Atherosclerosis of aorta: Secondary | ICD-10-CM | POA: Insufficient documentation

## 2017-11-30 DIAGNOSIS — K5730 Diverticulosis of large intestine without perforation or abscess without bleeding: Secondary | ICD-10-CM | POA: Diagnosis not present

## 2017-11-30 DIAGNOSIS — R1010 Upper abdominal pain, unspecified: Secondary | ICD-10-CM | POA: Diagnosis not present

## 2017-11-30 DIAGNOSIS — R634 Abnormal weight loss: Secondary | ICD-10-CM | POA: Insufficient documentation

## 2017-11-30 DIAGNOSIS — K5732 Diverticulitis of large intestine without perforation or abscess without bleeding: Secondary | ICD-10-CM | POA: Diagnosis not present

## 2017-11-30 MED ORDER — IOPAMIDOL (ISOVUE-300) INJECTION 61%
75.0000 mL | Freq: Once | INTRAVENOUS | Status: AC | PRN
  Administered 2017-11-30: 75 mL via INTRAVENOUS

## 2017-12-08 DIAGNOSIS — R351 Nocturia: Secondary | ICD-10-CM | POA: Diagnosis not present

## 2017-12-08 DIAGNOSIS — R350 Frequency of micturition: Secondary | ICD-10-CM | POA: Diagnosis not present

## 2017-12-08 DIAGNOSIS — N401 Enlarged prostate with lower urinary tract symptoms: Secondary | ICD-10-CM | POA: Diagnosis not present

## 2017-12-08 DIAGNOSIS — K219 Gastro-esophageal reflux disease without esophagitis: Secondary | ICD-10-CM | POA: Diagnosis not present

## 2018-01-08 DIAGNOSIS — D369 Benign neoplasm, unspecified site: Secondary | ICD-10-CM | POA: Diagnosis not present

## 2018-01-08 DIAGNOSIS — K210 Gastro-esophageal reflux disease with esophagitis: Secondary | ICD-10-CM | POA: Diagnosis not present

## 2018-01-12 ENCOUNTER — Ambulatory Visit (INDEPENDENT_AMBULATORY_CARE_PROVIDER_SITE_OTHER): Payer: PPO | Admitting: Urology

## 2018-01-12 ENCOUNTER — Encounter: Payer: Self-pay | Admitting: Urology

## 2018-01-12 ENCOUNTER — Other Ambulatory Visit
Admission: RE | Admit: 2018-01-12 | Discharge: 2018-01-12 | Disposition: A | Discharge status: Home / Self Care | Payer: PPO | Source: Ambulatory Visit | Attending: Urology | Admitting: Urology

## 2018-01-12 ENCOUNTER — Other Ambulatory Visit: Payer: Self-pay

## 2018-01-12 VITALS — BP 105/65 | HR 72 | Ht 71.0 in | Wt 170.0 lb

## 2018-01-12 DIAGNOSIS — N400 Enlarged prostate without lower urinary tract symptoms: Secondary | ICD-10-CM

## 2018-01-12 DIAGNOSIS — N401 Enlarged prostate with lower urinary tract symptoms: Secondary | ICD-10-CM | POA: Diagnosis not present

## 2018-01-12 DIAGNOSIS — R351 Nocturia: Secondary | ICD-10-CM | POA: Diagnosis not present

## 2018-01-12 LAB — BLADDER SCAN AMB NON-IMAGING

## 2018-01-12 LAB — URINALYSIS, COMPLETE (UACMP) WITH MICROSCOPIC
BACTERIA UA: NONE SEEN
Bilirubin Urine: NEGATIVE
Glucose, UA: NEGATIVE mg/dL
KETONES UR: NEGATIVE mg/dL
Leukocytes, UA: NEGATIVE
Nitrite: NEGATIVE
PROTEIN: NEGATIVE mg/dL
Specific Gravity, Urine: 1.015 (ref 1.005–1.030)
WBC UA: NONE SEEN WBC/hpf (ref 0–5)
pH: 7 (ref 5.0–8.0)

## 2018-01-12 NOTE — Progress Notes (Signed)
01/12/2018 3:29 PM   Trevor Deleon. [birth year] [id number]  Referring provider: Ezequiel Kayser, MD [facility] [place],  [id number]  Chief Complaint  Patient presents with  . Urinary Frequency    New patient    HPI: 84 year old male who presents today to establish care.  He has a husband of a patient of mine.  His previous urologist, Dr. Yves Dill no longer takes his insurance.  He does have a personal history of BPH and elevated PSA in the remote past.  Today, he denies any significant urinary symptoms other than new onset of nocturia over the past several months.  He has recently started drinking "monster" beverages as well as caffeinated beverages in the late afternoon after getting a Kurig machine.  If he does not drink these beverages, his urinary symptoms are minimal.  IPSS as below.  He has a personal history of elevated PSA.  His PSA was as high as 8.0 in the past.  He underwent prostate needle biopsy on 5/07 which was negative as well as in 10/2014 which was also negative.   Most recent PSA in 08/2016 which was 2.65.   He had a CT scan from 11/2017 CT abdomen pelvis with contrast ordered by GI.  This did show prostamegaly without any other concerning finding from a GU standpoint.  He is undergone surgical procedure for his prostate including cooled thermotherapy in 11/2014.  After having this procedure, he reports his urinary symptoms improved and remained stable until recently  He is currently on doxazosin, 8 mg daily.  He was previously on finasteride but was taken off this medication after his cooled thermal wave therapy.  PVR 98 mL.  IPSS    Row Name 01/12/18 1500         International Prostate Symptom Score   How often have you had the sensation of not emptying your bladder?  Not at All     How often have you had to urinate less than every two hours?  Less than 1 in 5 times     How often have you found you stopped and started again  several times when you urinated?  Not at All     How often have you found it difficult to postpone urination?  Less than 1 in 5 times     How often have you had a weak urinary stream?  Not at All     How often have you had to strain to start urination?  Not at All     How many times did you typically get up at night to urinate?  2 Times     Total IPSS Score  4       Quality of Life due to urinary symptoms   If you were to spend the rest of your life with your urinary condition just the way it is now how would you feel about that?  Mostly Satisfied        Score:  1-7 Mild 8-19 Moderate 20-35 Severe   PMH: Past Medical History:  Diagnosis Date  . Arthritis   . BPH (benign prostatic hyperplasia)   . CKD (chronic kidney disease) stage 3, GFR 30-59 ml/min (HCC)   . Coronary artery disease   . Diabetes mellitus without complication ([place])    Pt does not take medications.  . Duodenum disorder    BRUNNERS GLAND  . ED (erectile dysfunction)   . GERD (gastroesophageal reflux disease)   .  Hernia, femoral   . History of hiatal hernia   . Hyperlipidemia   . Hypertension   . Hypogonadism in male   . Labyrinthitis   . Myocardial infarction ([place])    1999  . Pancytopenia ([place])   . Stricture esophagus     Surgical History: Past Surgical History:  Procedure Laterality Date  . APPENDECTOMY    . CARDIAC CATHETERIZATION    . CARDIAC SURGERY    . CHOLECYSTECTOMY N/A 09/02/2015   Procedure: LAPAROSCOPIC CHOLECYSTECTOMY;  Surgeon: Leonie Green, MD;  Location: [facility] ORS;  Service: General;  Laterality: N/A;  . COLONOSCOPY    . CORONARY ANGIOPLASTY    . ERCP Left 09/03/2015   Procedure: ENDOSCOPIC RETROGRADE CHOLANGIOPANCREATOGRAPHY (ERCP);  Surgeon: Hulen Luster, MD;  Location[facility] ENDOSCOPY;  Service: Endoscopy;  Laterality: Left;  . ESOPHAGOGASTRODUODENOSCOPY (EGD) WITH PROPOFOL N/A 08/29/2017   Procedure: ESOPHAGOGASTRODUODENOSCOPY (EGD) WITH PROPOFOL;  Surgeon: Toledo, Benay Pike,  MD;  Location: [facility] ENDOSCOPY;  Service: Gastroenterology;  Laterality: N/A;  . FLEXIBLE SIGMOIDOSCOPY    . HERNIA REPAIR    . INGUINAL HERNIA REPAIR Left 12/15/2015   Procedure: HERNIA REPAIR INGUINAL ADULT;  Surgeon: Leonie Green, MD;  Location: [facility] ORS;  Service: General;  Laterality: Left;  . INTRAOPERATIVE CHOLANGIOGRAM  09/02/2015   Procedure: INTRAOPERATIVE CHOLANGIOGRAM;  Surgeon: Leonie Green, MD;  Location: [facility] ORS;  Service: General;;  . JOINT REPLACEMENT Left   . LIPOMA EXCISION    . PROSTATE SURGERY  12/17/2014  . TONSILLECTOMY      Home Medications:  Allergies as of 01/12/2018   No Known Allergies     Medication List        Accurate as of 01/12/18  3:29 PM. Always use your most recent med list.          acetaminophen 650 MG CR tablet Commonly known as:  TYLENOL Take 650 mg by mouth every 6 (six) hours as needed for pain.   ALPRAZolam 0.25 MG tablet Commonly known as:  XANAX Take 1 tablet by mouth at bedtime as needed for sleep.   aspirin EC 81 MG tablet Take 81 mg by mouth daily.   docusate sodium 100 MG capsule Commonly known as:  COLACE Take 100 mg by mouth 2 (two) times daily.   doxazosin 4 MG tablet Commonly known as:  CARDURA Take 4 mg by mouth daily.   Fish Oil 1000 MG Caps Take 3,000 mg by mouth 2 (two) times daily.   isosorbide mononitrate 30 MG 24 hr tablet Commonly known as:  IMDUR Take 30 mg by mouth daily.   meclizine 25 MG tablet Commonly known as:  ANTIVERT Take 25 mg by mouth every 6 (six) hours as needed for dizziness. Reported on 12/15/2015   METAMUCIL PO Take 1 Dose by mouth daily. Reported on 12/15/2015   MIRALAX PO Take 17 g by mouth daily.   multivitamin tablet Take 1 tablet by mouth daily.   nitroGLYCERIN 0.4 MG SL tablet Commonly known as:  NITROSTAT Place 0.4 mg under the tongue. UP TO 3 DOSES   pantoprazole 40 MG tablet Commonly known as:  PROTONIX Take 40 mg by mouth daily.   Polyethyl  Glycol-Propyl Glycol 0.4-0.3 % Soln Commonly known as:  SYSTANE Apply 1 drop to eye 4 (four) times daily as needed.   potassium chloride 10 MEQ tablet Commonly known as:  K-DUR Take 20 mEq by mouth daily.   ranitidine 150 MG tablet Commonly known as:  ZANTAC Take 150  mg by mouth 2 (two) times daily.   simvastatin 40 MG tablet Commonly known as:  ZOCOR Take 40 mg by mouth at bedtime.   triamterene-hydrochlorothiazide 37.5-25 MG tablet Commonly known as:  MAXZIDE-25 Take 1 tablet by mouth daily.       Allergies: No Known Allergies  Family History: Family History  Problem Relation Age of Onset  . Hypertension Mother   . Stroke Mother   . Prostate cancer Father   . Heart attack Father     Social History:  reports that he has quit smoking. His smoking use included cigarettes. He has a 15.00 pack-year smoking history. he has never used smokeless tobacco. He reports that he does not drink alcohol or use drugs.  ROS: UROLOGY Frequent Urination?: Yes Hard to postpone urination?: No Burning/pain with urination?: No Get up at night to urinate?: Yes Leakage of urine?: Yes Urine stream starts and stops?: No Trouble starting stream?: No Do you have to strain to urinate?: No Blood in urine?: No Urinary tract infection?: No Sexually transmitted disease?: No Injury to kidneys or bladder?: No Painful intercourse?: No Weak stream?: No Erection problems?: Yes Penile pain?: No  Gastrointestinal Nausea?: Yes Vomiting?: No Indigestion/heartburn?: Yes Diarrhea?: No Constipation?: No  Constitutional Fever: No Night sweats?: No Weight loss?: Yes Fatigue?: No  Skin Skin rash/lesions?: No Itching?: No  Eyes Blurred vision?: No Double vision?: No  Ears/Nose/Throat Sore throat?: No Sinus problems?: No  Hematologic/Lymphatic Swollen glands?: No Easy bruising?: No  Cardiovascular Leg swelling?: No Chest pain?: No  Respiratory Cough?: No Shortness of breath?:  No  Endocrine Excessive thirst?: No  Musculoskeletal Back pain?: Yes Joint pain?: Yes  Neurological Headaches?: No Dizziness?: No  Psychologic Depression?: No Anxiety?: No  Physical Exam: BP 105/65   Pulse 72   Ht 5\' 11"  (1.803 m)   Wt 170 lb (77.1 kg)   BMI 23.71 kg/m   Constitutional:  Alert and oriented, No acute distress.  Accompanied by daughter today. HEENT: [place] AT, moist mucus membranes.  Trachea midline, no masses. Cardiovascular: No clubbing, cyanosis, or edema. Respiratory: Normal respiratory effort, no increased work of breathing. GU: No CVA tenderness.  Skin: No rashes, bruises or suspicious lesions.. Neurologic: Grossly intact, no focal deficits, moving all 4 extremities. Psychiatric: Normal mood and affect.  Laboratory Data: Lab Results  Component Value Date   WBC 4.7 08/29/2017   HGB 15.6 08/29/2017   HCT 44.3 08/29/2017   MCV 84.6 08/29/2017   PLT 114 (L) 08/29/2017    1.6 on 11/2017, stable from baseline from at least 2015.  PSA as above  Urinalysis Component     Latest Ref Rng & Units 01/12/2018  Color, Urine     YELLOW YELLOW  Appearance     CLEAR CLEAR  Glucose     NEGATIVE mg/dL NEGATIVE  Bilirubin Urine     NEGATIVE NEGATIVE  Ketones, ur     NEGATIVE mg/dL NEGATIVE  Specific Gravity, Urine     1.005 - 1.030 1.015  Hgb urine dipstick     NEGATIVE TRACE (A)  pH     5.0 - 8.0 7.0  Protein     NEGATIVE mg/dL NEGATIVE  Nitrite     NEGATIVE NEGATIVE  Leukocytes, UA     NEGATIVE NEGATIVE  RBC / HPF     0 - 5 RBC/hpf 0-5  WBC, UA     0 - 5 WBC/hpf NONE SEEN  Bacteria, UA     NONE SEEN NONE SEEN  Squamous Epithelial / LPF     NONE SEEN 0-5 (A)    Pertinent Imaging: Results for orders placed or performed in visit on 01/12/18  BLADDER SCAN AMB NON-IMAGING  Result Value Ref Range   Scan Result 3ml     Assessment & Plan:    1. BPH associated with nocturia Relatively few symptoms, stable on Continue this  medication Nocturia primarily behavioral, discussed behavioral modification Post void residuals today borderline, however relatively asymptomatic without sequela Creatinine stable  Discussed PSA screening at length today.  Given that he has had multiple negative biopsies, his most recent PSA is within normal limits for his age proven prostate size, as well as risks and benefits of continued PSA screening at his age, I highly recommend cessation of a screening at this point in time.  Agreeable with this plan.  He was advised that he may follow-up as needed.  If his symptoms worsen, would like to see him back.  - BLADDER SCAN AMB NON-IMAGING   Hollice Espy, MD  [facility] [address] [place], [place] [address] [phone]

## 2018-01-16 DIAGNOSIS — K219 Gastro-esophageal reflux disease without esophagitis: Secondary | ICD-10-CM | POA: Diagnosis not present

## 2018-01-16 DIAGNOSIS — R911 Solitary pulmonary nodule: Secondary | ICD-10-CM | POA: Diagnosis not present

## 2018-01-16 DIAGNOSIS — N183 Chronic kidney disease, stage 3 (moderate): Secondary | ICD-10-CM | POA: Diagnosis not present

## 2018-01-16 DIAGNOSIS — E1122 Type 2 diabetes mellitus with diabetic chronic kidney disease: Secondary | ICD-10-CM | POA: Diagnosis not present

## 2018-01-16 DIAGNOSIS — N401 Enlarged prostate with lower urinary tract symptoms: Secondary | ICD-10-CM | POA: Diagnosis not present

## 2018-01-16 DIAGNOSIS — R351 Nocturia: Secondary | ICD-10-CM | POA: Diagnosis not present

## 2018-01-16 DIAGNOSIS — M25512 Pain in left shoulder: Secondary | ICD-10-CM | POA: Diagnosis not present

## 2018-01-16 DIAGNOSIS — I700 Atherosclerosis of aorta: Secondary | ICD-10-CM | POA: Diagnosis not present

## 2018-01-23 DIAGNOSIS — M1711 Unilateral primary osteoarthritis, right knee: Secondary | ICD-10-CM | POA: Diagnosis not present

## 2018-01-23 DIAGNOSIS — Z96652 Presence of left artificial knee joint: Secondary | ICD-10-CM | POA: Diagnosis not present

## 2018-03-06 DIAGNOSIS — H1589 Other disorders of sclera: Secondary | ICD-10-CM | POA: Diagnosis not present

## 2018-03-22 DIAGNOSIS — T502X5A Adverse effect of carbonic-anhydrase inhibitors, benzothiadiazides and other diuretics, initial encounter: Secondary | ICD-10-CM | POA: Diagnosis not present

## 2018-03-22 DIAGNOSIS — N401 Enlarged prostate with lower urinary tract symptoms: Secondary | ICD-10-CM | POA: Diagnosis not present

## 2018-03-22 DIAGNOSIS — E876 Hypokalemia: Secondary | ICD-10-CM | POA: Diagnosis not present

## 2018-03-22 DIAGNOSIS — M1711 Unilateral primary osteoarthritis, right knee: Secondary | ICD-10-CM | POA: Insufficient documentation

## 2018-03-22 DIAGNOSIS — E782 Mixed hyperlipidemia: Secondary | ICD-10-CM | POA: Diagnosis not present

## 2018-03-22 DIAGNOSIS — I25119 Atherosclerotic heart disease of native coronary artery with unspecified angina pectoris: Secondary | ICD-10-CM | POA: Diagnosis not present

## 2018-03-22 DIAGNOSIS — I700 Atherosclerosis of aorta: Secondary | ICD-10-CM | POA: Diagnosis not present

## 2018-03-22 DIAGNOSIS — N183 Chronic kidney disease, stage 3 (moderate): Secondary | ICD-10-CM | POA: Diagnosis not present

## 2018-03-22 DIAGNOSIS — Z79899 Other long term (current) drug therapy: Secondary | ICD-10-CM | POA: Diagnosis not present

## 2018-03-22 DIAGNOSIS — K219 Gastro-esophageal reflux disease without esophagitis: Secondary | ICD-10-CM | POA: Diagnosis not present

## 2018-03-22 DIAGNOSIS — E1122 Type 2 diabetes mellitus with diabetic chronic kidney disease: Secondary | ICD-10-CM | POA: Diagnosis not present

## 2018-03-22 DIAGNOSIS — I10 Essential (primary) hypertension: Secondary | ICD-10-CM | POA: Diagnosis not present

## 2018-03-22 DIAGNOSIS — Z0000 Encounter for general adult medical examination without abnormal findings: Secondary | ICD-10-CM | POA: Diagnosis not present

## 2018-03-22 DIAGNOSIS — D61818 Other pancytopenia: Secondary | ICD-10-CM | POA: Diagnosis not present

## 2018-03-23 DIAGNOSIS — D485 Neoplasm of uncertain behavior of skin: Secondary | ICD-10-CM | POA: Diagnosis not present

## 2018-03-23 DIAGNOSIS — L820 Inflamed seborrheic keratosis: Secondary | ICD-10-CM | POA: Diagnosis not present

## 2018-04-04 DIAGNOSIS — M65351 Trigger finger, right little finger: Secondary | ICD-10-CM | POA: Diagnosis not present

## 2018-04-04 DIAGNOSIS — M1711 Unilateral primary osteoarthritis, right knee: Secondary | ICD-10-CM | POA: Diagnosis not present

## 2018-04-23 DIAGNOSIS — E782 Mixed hyperlipidemia: Secondary | ICD-10-CM | POA: Diagnosis not present

## 2018-04-23 DIAGNOSIS — I10 Essential (primary) hypertension: Secondary | ICD-10-CM | POA: Diagnosis not present

## 2018-04-23 DIAGNOSIS — I700 Atherosclerosis of aorta: Secondary | ICD-10-CM | POA: Diagnosis not present

## 2018-04-23 DIAGNOSIS — I25119 Atherosclerotic heart disease of native coronary artery with unspecified angina pectoris: Secondary | ICD-10-CM | POA: Diagnosis not present

## 2018-05-08 DIAGNOSIS — L570 Actinic keratosis: Secondary | ICD-10-CM | POA: Diagnosis not present

## 2018-07-09 DIAGNOSIS — R140 Abdominal distension (gaseous): Secondary | ICD-10-CM | POA: Diagnosis not present

## 2018-07-09 DIAGNOSIS — K210 Gastro-esophageal reflux disease with esophagitis: Secondary | ICD-10-CM | POA: Diagnosis not present

## 2018-07-09 DIAGNOSIS — Z87898 Personal history of other specified conditions: Secondary | ICD-10-CM | POA: Diagnosis not present

## 2018-08-15 DIAGNOSIS — L720 Epidermal cyst: Secondary | ICD-10-CM | POA: Diagnosis not present

## 2018-08-15 DIAGNOSIS — Z1283 Encounter for screening for malignant neoplasm of skin: Secondary | ICD-10-CM | POA: Diagnosis not present

## 2018-08-15 DIAGNOSIS — L821 Other seborrheic keratosis: Secondary | ICD-10-CM | POA: Diagnosis not present

## 2018-08-15 DIAGNOSIS — L570 Actinic keratosis: Secondary | ICD-10-CM | POA: Diagnosis not present

## 2018-08-15 DIAGNOSIS — L578 Other skin changes due to chronic exposure to nonionizing radiation: Secondary | ICD-10-CM | POA: Diagnosis not present

## 2018-08-15 DIAGNOSIS — Z859 Personal history of malignant neoplasm, unspecified: Secondary | ICD-10-CM | POA: Diagnosis not present

## 2018-08-15 DIAGNOSIS — B009 Herpesviral infection, unspecified: Secondary | ICD-10-CM | POA: Diagnosis not present

## 2018-09-06 DIAGNOSIS — Z23 Encounter for immunization: Secondary | ICD-10-CM | POA: Diagnosis not present

## 2018-09-12 DIAGNOSIS — N183 Chronic kidney disease, stage 3 (moderate): Secondary | ICD-10-CM | POA: Diagnosis not present

## 2018-09-12 DIAGNOSIS — K219 Gastro-esophageal reflux disease without esophagitis: Secondary | ICD-10-CM | POA: Diagnosis not present

## 2018-09-12 DIAGNOSIS — E1159 Type 2 diabetes mellitus with other circulatory complications: Secondary | ICD-10-CM | POA: Diagnosis not present

## 2018-09-12 DIAGNOSIS — E538 Deficiency of other specified B group vitamins: Secondary | ICD-10-CM | POA: Insufficient documentation

## 2018-09-12 DIAGNOSIS — I25119 Atherosclerotic heart disease of native coronary artery with unspecified angina pectoris: Secondary | ICD-10-CM | POA: Diagnosis not present

## 2018-09-12 DIAGNOSIS — E782 Mixed hyperlipidemia: Secondary | ICD-10-CM | POA: Diagnosis not present

## 2018-09-12 DIAGNOSIS — T502X5A Adverse effect of carbonic-anhydrase inhibitors, benzothiadiazides and other diuretics, initial encounter: Secondary | ICD-10-CM | POA: Diagnosis not present

## 2018-09-12 DIAGNOSIS — E876 Hypokalemia: Secondary | ICD-10-CM | POA: Diagnosis not present

## 2018-09-12 DIAGNOSIS — D61818 Other pancytopenia: Secondary | ICD-10-CM | POA: Diagnosis not present

## 2018-09-12 DIAGNOSIS — Z0000 Encounter for general adult medical examination without abnormal findings: Secondary | ICD-10-CM | POA: Diagnosis not present

## 2018-09-12 DIAGNOSIS — E1169 Type 2 diabetes mellitus with other specified complication: Secondary | ICD-10-CM | POA: Diagnosis not present

## 2018-09-12 DIAGNOSIS — Z79899 Other long term (current) drug therapy: Secondary | ICD-10-CM | POA: Diagnosis not present

## 2018-09-12 DIAGNOSIS — E1122 Type 2 diabetes mellitus with diabetic chronic kidney disease: Secondary | ICD-10-CM | POA: Diagnosis not present

## 2018-09-12 DIAGNOSIS — N401 Enlarged prostate with lower urinary tract symptoms: Secondary | ICD-10-CM | POA: Diagnosis not present

## 2018-09-28 DIAGNOSIS — H2513 Age-related nuclear cataract, bilateral: Secondary | ICD-10-CM | POA: Diagnosis not present

## 2018-09-28 DIAGNOSIS — H40003 Preglaucoma, unspecified, bilateral: Secondary | ICD-10-CM | POA: Diagnosis not present

## 2018-10-23 DIAGNOSIS — I10 Essential (primary) hypertension: Secondary | ICD-10-CM | POA: Diagnosis not present

## 2018-10-23 DIAGNOSIS — E1159 Type 2 diabetes mellitus with other circulatory complications: Secondary | ICD-10-CM | POA: Diagnosis not present

## 2018-10-23 DIAGNOSIS — N183 Chronic kidney disease, stage 3 (moderate): Secondary | ICD-10-CM | POA: Diagnosis not present

## 2018-10-23 DIAGNOSIS — R079 Chest pain, unspecified: Secondary | ICD-10-CM | POA: Diagnosis not present

## 2018-10-23 DIAGNOSIS — I25119 Atherosclerotic heart disease of native coronary artery with unspecified angina pectoris: Secondary | ICD-10-CM | POA: Diagnosis not present

## 2018-10-23 DIAGNOSIS — E1122 Type 2 diabetes mellitus with diabetic chronic kidney disease: Secondary | ICD-10-CM | POA: Diagnosis not present

## 2018-11-06 DIAGNOSIS — E1122 Type 2 diabetes mellitus with diabetic chronic kidney disease: Secondary | ICD-10-CM | POA: Diagnosis not present

## 2018-11-06 DIAGNOSIS — R079 Chest pain, unspecified: Secondary | ICD-10-CM | POA: Diagnosis not present

## 2018-11-06 DIAGNOSIS — I25119 Atherosclerotic heart disease of native coronary artery with unspecified angina pectoris: Secondary | ICD-10-CM | POA: Diagnosis not present

## 2018-11-06 DIAGNOSIS — N183 Chronic kidney disease, stage 3 (moderate): Secondary | ICD-10-CM | POA: Diagnosis not present

## 2018-11-19 DIAGNOSIS — I25119 Atherosclerotic heart disease of native coronary artery with unspecified angina pectoris: Secondary | ICD-10-CM | POA: Diagnosis not present

## 2018-11-19 DIAGNOSIS — E1159 Type 2 diabetes mellitus with other circulatory complications: Secondary | ICD-10-CM | POA: Diagnosis not present

## 2018-11-19 DIAGNOSIS — E1122 Type 2 diabetes mellitus with diabetic chronic kidney disease: Secondary | ICD-10-CM | POA: Diagnosis not present

## 2018-11-19 DIAGNOSIS — N183 Chronic kidney disease, stage 3 (moderate): Secondary | ICD-10-CM | POA: Diagnosis not present

## 2018-11-19 DIAGNOSIS — I700 Atherosclerosis of aorta: Secondary | ICD-10-CM | POA: Diagnosis not present

## 2018-11-19 DIAGNOSIS — I10 Essential (primary) hypertension: Secondary | ICD-10-CM | POA: Diagnosis not present

## 2019-01-22 DIAGNOSIS — M1711 Unilateral primary osteoarthritis, right knee: Secondary | ICD-10-CM | POA: Diagnosis not present

## 2019-01-22 DIAGNOSIS — Z96653 Presence of artificial knee joint, bilateral: Secondary | ICD-10-CM | POA: Diagnosis not present

## 2019-01-22 DIAGNOSIS — Z96652 Presence of left artificial knee joint: Secondary | ICD-10-CM | POA: Diagnosis not present

## 2019-01-22 DIAGNOSIS — M170 Bilateral primary osteoarthritis of knee: Secondary | ICD-10-CM | POA: Diagnosis not present

## 2019-01-29 DIAGNOSIS — K219 Gastro-esophageal reflux disease without esophagitis: Secondary | ICD-10-CM | POA: Diagnosis not present

## 2019-03-19 DIAGNOSIS — E1159 Type 2 diabetes mellitus with other circulatory complications: Secondary | ICD-10-CM | POA: Diagnosis not present

## 2019-03-19 DIAGNOSIS — D61818 Other pancytopenia: Secondary | ICD-10-CM | POA: Diagnosis not present

## 2019-03-19 DIAGNOSIS — E538 Deficiency of other specified B group vitamins: Secondary | ICD-10-CM | POA: Diagnosis not present

## 2019-03-19 DIAGNOSIS — N183 Chronic kidney disease, stage 3 (moderate): Secondary | ICD-10-CM | POA: Diagnosis not present

## 2019-03-19 DIAGNOSIS — I25119 Atherosclerotic heart disease of native coronary artery with unspecified angina pectoris: Secondary | ICD-10-CM | POA: Diagnosis not present

## 2019-03-19 DIAGNOSIS — E1122 Type 2 diabetes mellitus with diabetic chronic kidney disease: Secondary | ICD-10-CM | POA: Diagnosis not present

## 2019-03-19 DIAGNOSIS — E1169 Type 2 diabetes mellitus with other specified complication: Secondary | ICD-10-CM | POA: Diagnosis not present

## 2019-03-19 DIAGNOSIS — I700 Atherosclerosis of aorta: Secondary | ICD-10-CM | POA: Diagnosis not present

## 2019-03-19 DIAGNOSIS — Z0000 Encounter for general adult medical examination without abnormal findings: Secondary | ICD-10-CM | POA: Diagnosis not present

## 2019-03-19 DIAGNOSIS — K219 Gastro-esophageal reflux disease without esophagitis: Secondary | ICD-10-CM | POA: Diagnosis not present

## 2019-03-19 DIAGNOSIS — E785 Hyperlipidemia, unspecified: Secondary | ICD-10-CM | POA: Diagnosis not present

## 2019-03-19 DIAGNOSIS — N401 Enlarged prostate with lower urinary tract symptoms: Secondary | ICD-10-CM | POA: Diagnosis not present

## 2019-03-19 DIAGNOSIS — Z79899 Other long term (current) drug therapy: Secondary | ICD-10-CM | POA: Diagnosis not present

## 2019-03-21 ENCOUNTER — Other Ambulatory Visit (HOSPITAL_COMMUNITY): Payer: Self-pay | Admitting: Internal Medicine

## 2019-03-21 ENCOUNTER — Other Ambulatory Visit: Payer: Self-pay | Admitting: Internal Medicine

## 2019-03-21 DIAGNOSIS — Z0000 Encounter for general adult medical examination without abnormal findings: Secondary | ICD-10-CM

## 2019-03-21 DIAGNOSIS — R911 Solitary pulmonary nodule: Secondary | ICD-10-CM

## 2019-03-25 ENCOUNTER — Other Ambulatory Visit: Payer: Self-pay | Admitting: Gastroenterology

## 2019-03-25 ENCOUNTER — Other Ambulatory Visit (HOSPITAL_COMMUNITY): Payer: Self-pay | Admitting: Gastroenterology

## 2019-03-25 DIAGNOSIS — R1032 Left lower quadrant pain: Secondary | ICD-10-CM | POA: Diagnosis not present

## 2019-03-25 DIAGNOSIS — R634 Abnormal weight loss: Secondary | ICD-10-CM | POA: Diagnosis not present

## 2019-03-25 DIAGNOSIS — R1013 Epigastric pain: Secondary | ICD-10-CM | POA: Diagnosis not present

## 2019-03-25 DIAGNOSIS — R1031 Right lower quadrant pain: Secondary | ICD-10-CM | POA: Diagnosis not present

## 2019-04-23 ENCOUNTER — Other Ambulatory Visit: Payer: Self-pay

## 2019-04-23 ENCOUNTER — Ambulatory Visit
Admission: RE | Admit: 2019-04-23 | Discharge: 2019-04-23 | Disposition: A | Discharge status: Home / Self Care | Payer: PPO | Source: Ambulatory Visit | Attending: Internal Medicine | Admitting: Internal Medicine

## 2019-04-23 DIAGNOSIS — R918 Other nonspecific abnormal finding of lung field: Secondary | ICD-10-CM | POA: Diagnosis not present

## 2019-04-23 DIAGNOSIS — Z0000 Encounter for general adult medical examination without abnormal findings: Secondary | ICD-10-CM | POA: Diagnosis not present

## 2019-04-23 DIAGNOSIS — R911 Solitary pulmonary nodule: Secondary | ICD-10-CM | POA: Diagnosis not present

## 2019-04-26 ENCOUNTER — Other Ambulatory Visit: Payer: Self-pay

## 2019-04-26 ENCOUNTER — Ambulatory Visit
Admission: RE | Admit: 2019-04-26 | Discharge: 2019-04-26 | Disposition: A | Discharge status: Home / Self Care | Payer: PPO | Source: Ambulatory Visit | Attending: Gastroenterology | Admitting: Gastroenterology

## 2019-04-26 DIAGNOSIS — R1013 Epigastric pain: Secondary | ICD-10-CM

## 2019-04-26 DIAGNOSIS — K30 Functional dyspepsia: Secondary | ICD-10-CM | POA: Diagnosis not present

## 2019-05-01 DIAGNOSIS — K210 Gastro-esophageal reflux disease with esophagitis: Secondary | ICD-10-CM | POA: Diagnosis not present

## 2019-05-01 DIAGNOSIS — K5900 Constipation, unspecified: Secondary | ICD-10-CM | POA: Diagnosis not present

## 2019-05-01 DIAGNOSIS — Z87898 Personal history of other specified conditions: Secondary | ICD-10-CM | POA: Diagnosis not present

## 2019-05-21 DIAGNOSIS — I712 Thoracic aortic aneurysm, without rupture: Secondary | ICD-10-CM | POA: Insufficient documentation

## 2019-05-21 DIAGNOSIS — E1159 Type 2 diabetes mellitus with other circulatory complications: Secondary | ICD-10-CM | POA: Diagnosis not present

## 2019-05-21 DIAGNOSIS — I7121 Aneurysm of the ascending aorta, without rupture: Secondary | ICD-10-CM | POA: Insufficient documentation

## 2019-05-21 DIAGNOSIS — I700 Atherosclerosis of aorta: Secondary | ICD-10-CM | POA: Diagnosis not present

## 2019-05-21 DIAGNOSIS — E1169 Type 2 diabetes mellitus with other specified complication: Secondary | ICD-10-CM | POA: Diagnosis not present

## 2019-05-21 DIAGNOSIS — I10 Essential (primary) hypertension: Secondary | ICD-10-CM | POA: Diagnosis not present

## 2019-05-21 DIAGNOSIS — E785 Hyperlipidemia, unspecified: Secondary | ICD-10-CM | POA: Diagnosis not present

## 2019-05-21 DIAGNOSIS — I25119 Atherosclerotic heart disease of native coronary artery with unspecified angina pectoris: Secondary | ICD-10-CM | POA: Diagnosis not present

## 2019-06-12 DIAGNOSIS — K644 Residual hemorrhoidal skin tags: Secondary | ICD-10-CM | POA: Diagnosis not present

## 2019-06-12 DIAGNOSIS — K5909 Other constipation: Secondary | ICD-10-CM | POA: Insufficient documentation

## 2019-07-10 ENCOUNTER — Other Ambulatory Visit (HOSPITAL_COMMUNITY): Payer: Self-pay | Admitting: Gastroenterology

## 2019-07-10 ENCOUNTER — Other Ambulatory Visit: Payer: Self-pay | Admitting: Gastroenterology

## 2019-07-10 DIAGNOSIS — D696 Thrombocytopenia, unspecified: Secondary | ICD-10-CM | POA: Diagnosis not present

## 2019-07-10 DIAGNOSIS — K210 Gastro-esophageal reflux disease with esophagitis: Secondary | ICD-10-CM | POA: Diagnosis not present

## 2019-07-10 DIAGNOSIS — K5909 Other constipation: Secondary | ICD-10-CM | POA: Diagnosis not present

## 2019-07-10 DIAGNOSIS — R17 Unspecified jaundice: Secondary | ICD-10-CM

## 2019-07-11 ENCOUNTER — Other Ambulatory Visit: Payer: Self-pay | Admitting: Gastroenterology

## 2019-07-11 DIAGNOSIS — R17 Unspecified jaundice: Secondary | ICD-10-CM

## 2019-07-11 DIAGNOSIS — D696 Thrombocytopenia, unspecified: Secondary | ICD-10-CM

## 2019-07-16 ENCOUNTER — Other Ambulatory Visit: Payer: Self-pay

## 2019-07-16 ENCOUNTER — Ambulatory Visit
Admission: RE | Admit: 2019-07-16 | Discharge: 2019-07-16 | Disposition: A | Discharge status: Home / Self Care | Payer: PPO | Source: Ambulatory Visit | Attending: Gastroenterology | Admitting: Gastroenterology

## 2019-07-16 DIAGNOSIS — N281 Cyst of kidney, acquired: Secondary | ICD-10-CM | POA: Diagnosis not present

## 2019-07-16 DIAGNOSIS — D696 Thrombocytopenia, unspecified: Secondary | ICD-10-CM | POA: Insufficient documentation

## 2019-07-16 DIAGNOSIS — R17 Unspecified jaundice: Secondary | ICD-10-CM | POA: Diagnosis not present

## 2019-08-16 DIAGNOSIS — Z23 Encounter for immunization: Secondary | ICD-10-CM | POA: Diagnosis not present

## 2019-08-19 DIAGNOSIS — N390 Urinary tract infection, site not specified: Secondary | ICD-10-CM | POA: Diagnosis not present

## 2019-08-19 DIAGNOSIS — A499 Bacterial infection, unspecified: Secondary | ICD-10-CM | POA: Diagnosis not present

## 2019-08-19 DIAGNOSIS — R300 Dysuria: Secondary | ICD-10-CM | POA: Diagnosis not present

## 2019-08-21 DIAGNOSIS — L570 Actinic keratosis: Secondary | ICD-10-CM | POA: Diagnosis not present

## 2019-08-21 DIAGNOSIS — Z872 Personal history of diseases of the skin and subcutaneous tissue: Secondary | ICD-10-CM | POA: Diagnosis not present

## 2019-08-21 DIAGNOSIS — B009 Herpesviral infection, unspecified: Secondary | ICD-10-CM | POA: Diagnosis not present

## 2019-08-21 DIAGNOSIS — Z859 Personal history of malignant neoplasm, unspecified: Secondary | ICD-10-CM | POA: Diagnosis not present

## 2019-08-21 DIAGNOSIS — L578 Other skin changes due to chronic exposure to nonionizing radiation: Secondary | ICD-10-CM | POA: Diagnosis not present

## 2019-08-21 DIAGNOSIS — D1722 Benign lipomatous neoplasm of skin and subcutaneous tissue of left arm: Secondary | ICD-10-CM | POA: Diagnosis not present

## 2019-09-18 DIAGNOSIS — I712 Thoracic aortic aneurysm, without rupture: Secondary | ICD-10-CM | POA: Diagnosis not present

## 2019-09-18 DIAGNOSIS — D61818 Other pancytopenia: Secondary | ICD-10-CM | POA: Diagnosis not present

## 2019-09-18 DIAGNOSIS — I25119 Atherosclerotic heart disease of native coronary artery with unspecified angina pectoris: Secondary | ICD-10-CM | POA: Diagnosis not present

## 2019-09-18 DIAGNOSIS — E538 Deficiency of other specified B group vitamins: Secondary | ICD-10-CM | POA: Diagnosis not present

## 2019-09-18 DIAGNOSIS — Z79899 Other long term (current) drug therapy: Secondary | ICD-10-CM | POA: Diagnosis not present

## 2019-09-18 DIAGNOSIS — E785 Hyperlipidemia, unspecified: Secondary | ICD-10-CM | POA: Diagnosis not present

## 2019-09-18 DIAGNOSIS — N401 Enlarged prostate with lower urinary tract symptoms: Secondary | ICD-10-CM | POA: Diagnosis not present

## 2019-09-18 DIAGNOSIS — E1121 Type 2 diabetes mellitus with diabetic nephropathy: Secondary | ICD-10-CM | POA: Diagnosis not present

## 2019-09-18 DIAGNOSIS — E1169 Type 2 diabetes mellitus with other specified complication: Secondary | ICD-10-CM | POA: Diagnosis not present

## 2019-09-18 DIAGNOSIS — N1832 Chronic kidney disease, stage 3b: Secondary | ICD-10-CM | POA: Diagnosis not present

## 2019-09-18 DIAGNOSIS — T502X5A Adverse effect of carbonic-anhydrase inhibitors, benzothiadiazides and other diuretics, initial encounter: Secondary | ICD-10-CM | POA: Diagnosis not present

## 2019-09-18 DIAGNOSIS — K760 Fatty (change of) liver, not elsewhere classified: Secondary | ICD-10-CM | POA: Insufficient documentation

## 2019-09-18 DIAGNOSIS — E876 Hypokalemia: Secondary | ICD-10-CM | POA: Diagnosis not present

## 2019-09-18 DIAGNOSIS — Z0000 Encounter for general adult medical examination without abnormal findings: Secondary | ICD-10-CM | POA: Diagnosis not present

## 2019-09-18 DIAGNOSIS — E1159 Type 2 diabetes mellitus with other circulatory complications: Secondary | ICD-10-CM | POA: Diagnosis not present

## 2019-09-18 DIAGNOSIS — I700 Atherosclerosis of aorta: Secondary | ICD-10-CM | POA: Diagnosis not present

## 2019-09-27 DIAGNOSIS — H2513 Age-related nuclear cataract, bilateral: Secondary | ICD-10-CM | POA: Diagnosis not present

## 2019-10-08 ENCOUNTER — Other Ambulatory Visit: Payer: Self-pay

## 2019-10-08 DIAGNOSIS — N401 Enlarged prostate with lower urinary tract symptoms: Secondary | ICD-10-CM

## 2019-10-11 ENCOUNTER — Ambulatory Visit: Payer: PPO | Admitting: Urology

## 2019-10-11 ENCOUNTER — Other Ambulatory Visit
Admission: RE | Admit: 2019-10-11 | Discharge: 2019-10-11 | Disposition: A | Discharge status: Home / Self Care | Payer: PPO | Attending: Urology | Admitting: Urology

## 2019-10-11 ENCOUNTER — Encounter: Payer: Self-pay | Admitting: Urology

## 2019-10-11 ENCOUNTER — Other Ambulatory Visit: Payer: Self-pay

## 2019-10-11 VITALS — BP 121/70 | HR 74 | Ht 71.0 in | Wt 170.0 lb

## 2019-10-11 DIAGNOSIS — N401 Enlarged prostate with lower urinary tract symptoms: Secondary | ICD-10-CM | POA: Insufficient documentation

## 2019-10-11 DIAGNOSIS — K219 Gastro-esophageal reflux disease without esophagitis: Secondary | ICD-10-CM | POA: Insufficient documentation

## 2019-10-11 DIAGNOSIS — R351 Nocturia: Secondary | ICD-10-CM | POA: Insufficient documentation

## 2019-10-11 DIAGNOSIS — E1169 Type 2 diabetes mellitus with other specified complication: Secondary | ICD-10-CM | POA: Insufficient documentation

## 2019-10-11 DIAGNOSIS — M1990 Unspecified osteoarthritis, unspecified site: Secondary | ICD-10-CM | POA: Insufficient documentation

## 2019-10-11 DIAGNOSIS — H8309 Labyrinthitis, unspecified ear: Secondary | ICD-10-CM | POA: Insufficient documentation

## 2019-10-11 DIAGNOSIS — E785 Hyperlipidemia, unspecified: Secondary | ICD-10-CM | POA: Insufficient documentation

## 2019-10-11 LAB — URINALYSIS, COMPLETE (UACMP) WITH MICROSCOPIC
Bacteria, UA: NONE SEEN
Bilirubin Urine: NEGATIVE
Glucose, UA: NEGATIVE mg/dL
Hgb urine dipstick: NEGATIVE
Ketones, ur: NEGATIVE mg/dL
Leukocytes,Ua: NEGATIVE
Nitrite: NEGATIVE
Protein, ur: NEGATIVE mg/dL
Specific Gravity, Urine: 1.02 (ref 1.005–1.030)
pH: 6 (ref 5.0–8.0)

## 2019-10-11 LAB — BLADDER SCAN AMB NON-IMAGING

## 2019-10-11 NOTE — Progress Notes (Signed)
10/11/2019 10:05 AM   Trevor Deleon. 1932/07/13 [id number]  Referring provider: Ezequiel Kayser, MD [place] [facility] [place],  [place] [address]  Chief Complaint  Patient presents with  . Benign Prostatic Hypertrophy    HPI: 85 year old patient known to me who returns today for worsening urinary symptoms.  He has a personal history of elevated PSA.  His PSA was as high as 8.0 in the past.  He underwent prostate needle biopsy on 5/07 which was negative as well as in 10/2014 which was also negative.   Most recent PSA in 08/2016 which was 2.65.  No further PSA has been recommended.  He had a CT scan from 11/2017 CT abdomen pelvis with contrast ordered by GI.  This did show prostamegaly without any other concerning finding from a GU standpoint.  He is undergone surgical procedure for his prostate including cooled thermotherapy in 11/2014.  He is currently on doxazosin, 8 mg daily.  He was previously on finasteride but was taken off this medication after his cooled thermal wave therapy.   He reports today that over the past year or so, he has been getting up of it need to void more frequently.  This is on average 2 times a day but can be as many as 3.  Last ideally put up once.  He avoids drinking beverages after 3 PM for this very reason.  He reports that he can go all the way to the early morning but then gets up several times in the early morning to void.  This is disruptive to his sleep he has a hard time getting back to sleep.  He does snore.  He is never pursued sleep study although he did discuss this with Dr. Dorthula Perfect in the past.  He has little to no daytime urinary symptoms present.  These are well controlled on Cardura 8 mg.  He feels like he empties his bladder completely.  He was treated for presumed urinary tract infection earlier this year with a suspicious appearing urinalysis but the culture was negative.  His symptoms resolved with antibiotics.    IPSS    Row Name 10/11/19 0900         International Prostate Symptom Score   How often have you had the sensation of not emptying your bladder?  Not at All     How often have you had to urinate less than every two hours?  Less than 1 in 5 times     How often have you found you stopped and started again several times when you urinated?  Not at All     How often have you found it difficult to postpone urination?  Less than 1 in 5 times     How often have you had a weak urinary stream?  Not at All     How often have you had to strain to start urination?  Not at All     How many times did you typically get up at night to urinate?  2 Times     Total IPSS Score  4       Quality of Life due to urinary symptoms   If you were to spend the rest of your life with your urinary condition just the way it is now how would you feel about that?  Mixed        Score:  1-7 Mild 8-19 Moderate 20-35 Severe   PMH: Past Medical History:  Diagnosis  Date  . Arthritis   . BPH (benign prostatic hyperplasia)   . CKD (chronic kidney disease) stage 3, GFR 30-59 ml/min   . Coronary artery disease   . Diabetes mellitus without complication ([place])    Pt does not take medications.  . Duodenum disorder    BRUNNERS GLAND  . ED (erectile dysfunction)   . GERD (gastroesophageal reflux disease)   . Hernia, femoral   . History of hiatal hernia   . Hyperlipidemia   . Hypertension   . Hypogonadism in male   . Labyrinthitis   . Myocardial infarction ([place])    1999  . Pancytopenia ([place])   . Stricture esophagus     Surgical History: Past Surgical History:  Procedure Laterality Date  . APPENDECTOMY    . CARDIAC CATHETERIZATION    . CARDIAC SURGERY    . CHOLECYSTECTOMY N/A 09/02/2015   Procedure: LAPAROSCOPIC CHOLECYSTECTOMY;  Surgeon: Leonie Green, MD;  Location: [facility] ORS;  Service: General;  Laterality: N/A;  . COLONOSCOPY    . CORONARY ANGIOPLASTY    . ERCP Left 09/03/2015   Procedure:  ENDOSCOPIC RETROGRADE CHOLANGIOPANCREATOGRAPHY (ERCP);  Surgeon: Hulen Luster, MD;  Location: [facility] ENDOSCOPY;  Service: Endoscopy;  Laterality: Left;  . ESOPHAGOGASTRODUODENOSCOPY (EGD) WITH PROPOFOL N/A 08/29/2017   Procedure: ESOPHAGOGASTRODUODENOSCOPY (EGD) WITH PROPOFOL;  Surgeon: Toledo, Benay Pike, MD;  Location: [facility] ENDOSCOPY;  Service: Gastroenterology;  Laterality: N/A;  . FLEXIBLE SIGMOIDOSCOPY    . HERNIA REPAIR    . INGUINAL HERNIA REPAIR Left 12/15/2015   Procedure: HERNIA REPAIR INGUINAL ADULT;  Surgeon: Leonie Green, MD;  Location: [facility] ORS;  Service: General;  Laterality: Left;  . INTRAOPERATIVE CHOLANGIOGRAM  09/02/2015   Procedure: INTRAOPERATIVE CHOLANGIOGRAM;  Surgeon: Leonie Green, MD;  Location: [facility] ORS;  Service: General;;  . JOINT REPLACEMENT Left   . LIPOMA EXCISION    . PROSTATE SURGERY  12/17/2014  . TONSILLECTOMY      Home Medications:  Allergies as of 10/11/2019   No Known Allergies     Medication List       Accurate as of October 11, 2019 10:05 AM. If you have any questions, ask your nurse or doctor.        STOP taking these medications   doxazosin 4 MG tablet Commonly known as: CARDURA Stopped by: Hollice Espy, MD   isosorbide mononitrate 30 MG 24 hr tablet Commonly known as: IMDUR Stopped by: Hollice Espy, MD   meclizine 25 MG tablet Commonly known as: ANTIVERT Stopped by: Hollice Espy, MD   multivitamin tablet Stopped by: Hollice Espy, MD   Polyethyl Glycol-Propyl Glycol 0.4-0.3 % Soln Commonly known as: Systane Stopped by: Hollice Espy, MD   potassium chloride 10 MEQ tablet Commonly known as: KLOR-CON Stopped by: Hollice Espy, MD   ranitidine 150 MG tablet Commonly known as: ZANTAC Stopped by: Hollice Espy, MD     TAKE these medications   acetaminophen 650 MG CR tablet Commonly known as: TYLENOL Take 650 mg by mouth every 6 (six) hours as needed for pain.   ALPRAZolam 0.25 MG tablet Commonly known  as: XANAX Take 1 tablet by mouth at bedtime as needed for sleep.   aspirin EC 81 MG tablet Take 81 mg by mouth daily.   docusate sodium 100 MG capsule Commonly known as: COLACE Take 100 mg by mouth 2 (two) times daily.   Fish Oil 1000 MG Caps Take 3,000 mg by mouth 2 (two) times daily.   METAMUCIL PO Take 1  Dose by mouth daily. Reported on 12/15/2015   MIRALAX PO Take 17 g by mouth daily.   nitroGLYCERIN 0.4 MG SL tablet Commonly known as: NITROSTAT Place 0.4 mg under the tongue. UP TO 3 DOSES   pantoprazole 40 MG tablet Commonly known as: PROTONIX Take 40 mg by mouth daily.   simvastatin 40 MG tablet Commonly known as: ZOCOR Take 40 mg by mouth at bedtime.   triamterene-hydrochlorothiazide 37.5-25 MG tablet Commonly known as: MAXZIDE-25 Take 1 tablet by mouth daily.       Allergies: No Known Allergies  Family History: Family History  Problem Relation Age of Onset  . Hypertension Mother   . Stroke Mother   . Prostate cancer Father   . Heart attack Father     Social History:  reports that he has quit smoking. His smoking use included cigarettes. He has a 15.00 pack-year smoking history. He has never used smokeless tobacco. He reports that he does not drink alcohol or use drugs.  ROS: UROLOGY Frequent Urination?: Yes Hard to postpone urination?: No Burning/pain with urination?: No Get up at night to urinate?: Yes Leakage of urine?: Yes Urine stream starts and stops?: No Trouble starting stream?: No Do you have to strain to urinate?: No Blood in urine?: No Urinary tract infection?: No Sexually transmitted disease?: No Injury to kidneys or bladder?: No Painful intercourse?: No Weak stream?: No Erection problems?: No Penile pain?: No  Gastrointestinal Nausea?: No Vomiting?: No Indigestion/heartburn?: Yes Diarrhea?: No Constipation?: No  Constitutional Fever: No Night sweats?: No Weight loss?: No Fatigue?: No  Skin Skin rash/lesions?: No  Itching?: No  Eyes Blurred vision?: No Double vision?: No  Ears/Nose/Throat Sore throat?: No Sinus problems?: No  Hematologic/Lymphatic Swollen glands?: No Easy bruising?: No  Cardiovascular Leg swelling?: No Chest pain?: No  Respiratory Cough?: No Shortness of breath?: No  Endocrine Excessive thirst?: No  Musculoskeletal Back pain?: No Joint pain?: No  Neurological Headaches?: No Dizziness?: No  Psychologic Depression?: No Anxiety?: No  Physical Exam: BP 121/70   Pulse 74   Ht 5\' 11"  (1.803 m)   Wt 170 lb (77.1 kg)   BMI 23.71 kg/m   Constitutional:  Alert and oriented, No acute distress. HEENT: [place] AT, moist mucus membranes.  Trachea midline, no masses. Cardiovascular: No clubbing, cyanosis, or edema. Respiratory: Normal respiratory effort, no increased work of breathing. Skin: No rashes, bruises or suspicious lesions. Neurologic: Grossly intact, no focal deficits, moving all 4 extremities. Psychiatric: Normal mood and affect.  Laboratory Data: Lab Results  Component Value Date   WBC 4.7 08/29/2017   HGB 15.6 08/29/2017   HCT 44.3 08/29/2017   MCV 84.6 08/29/2017   PLT 114 (L) 08/29/2017    Lab Results  Component Value Date   CREATININE 1.18 09/04/2015    Urinalysis    Component Value Date/Time   COLORURINE YELLOW 10/11/2019 0925   APPEARANCEUR CLEAR 10/11/2019 0925   LABSPEC 1.020 10/11/2019 0925   PHURINE 6.0 10/11/2019 0925   GLUCOSEU NEGATIVE 10/11/2019 0925   HGBUR NEGATIVE 10/11/2019 0925   BILIRUBINUR NEGATIVE 10/11/2019 0925   [place] 10/11/2019 0925   PROTEINUR NEGATIVE 10/11/2019 0925   NITRITE NEGATIVE 10/11/2019 0925   LEUKOCYTESUR NEGATIVE 10/11/2019 0925    Lab Results  Component Value Date   BACTERIA NONE SEEN 10/11/2019    Pertinent Imaging: Results for orders placed or performed in visit on 10/11/19  Bladder Scan (Post Void Residual) in office  Result Value Ref Range   Scan Result  51ml       Assessment & Plan:    1. BPH associated with nocturia Again, nighttime symptoms likely multifactorial.    Given the absence of daytime symptoms, think it is less likely that it is related to his prostate this little benefit from resuming finasteride.  He is given samples of Myrbetriq 25 mg to be taken nightly x4 weeks today.  We discussed that if this medication is helpful, we can write a prescription but will need to check a bladder scan to ensure that he is emptying adequately.  If this fails, we can try DDAVP or increase the dose of Myrbetriq.  Given his age, would not recommend anticholinergic.  I have strongly recommended that he read discuss pursuing a sleep study as this is possibly an underlying contributing factor. - Bladder Scan (Post Void Residual) in office  Call in 4 weeks to let us know how Myrbetriq is working, if effective then have him come in for a bladder scan on this medication to ensure he is emptying.  Hollice Espy, MD  [facility] [address], [place], [place] [address] [phone]

## 2019-10-25 DIAGNOSIS — E1159 Type 2 diabetes mellitus with other circulatory complications: Secondary | ICD-10-CM | POA: Diagnosis not present

## 2019-10-25 DIAGNOSIS — I25119 Atherosclerotic heart disease of native coronary artery with unspecified angina pectoris: Secondary | ICD-10-CM | POA: Diagnosis not present

## 2019-10-25 DIAGNOSIS — I10 Essential (primary) hypertension: Secondary | ICD-10-CM | POA: Diagnosis not present

## 2019-10-25 DIAGNOSIS — I712 Thoracic aortic aneurysm, without rupture: Secondary | ICD-10-CM | POA: Diagnosis not present

## 2019-11-04 ENCOUNTER — Encounter: Payer: Self-pay | Admitting: Urology

## 2019-11-04 MED ORDER — MIRABEGRON ER 25 MG PO TB24: 25.0000 mg | ORAL_TABLET | Freq: Every day | ORAL | 11 refills | Status: DC

## 2019-11-04 NOTE — Telephone Encounter (Signed)
Called patient-sent in Myrbetriq to pharmacy as requested and scheduled 6 month follow up with PA per Dr. Erlene Quan. Patient voiced Trevor Deleon is aware of appointment time and date.

## 2020-01-08 DIAGNOSIS — R17 Unspecified jaundice: Secondary | ICD-10-CM | POA: Diagnosis not present

## 2020-01-08 DIAGNOSIS — K2100 Gastro-esophageal reflux disease with esophagitis, without bleeding: Secondary | ICD-10-CM | POA: Diagnosis not present

## 2020-01-08 DIAGNOSIS — D696 Thrombocytopenia, unspecified: Secondary | ICD-10-CM | POA: Diagnosis not present

## 2020-02-19 DIAGNOSIS — M5431 Sciatica, right side: Secondary | ICD-10-CM | POA: Diagnosis not present

## 2020-02-19 DIAGNOSIS — M5432 Sciatica, left side: Secondary | ICD-10-CM | POA: Diagnosis not present

## 2020-02-27 DIAGNOSIS — M5432 Sciatica, left side: Secondary | ICD-10-CM | POA: Diagnosis not present

## 2020-02-27 DIAGNOSIS — M5441 Lumbago with sciatica, right side: Secondary | ICD-10-CM | POA: Diagnosis not present

## 2020-02-27 DIAGNOSIS — M5442 Lumbago with sciatica, left side: Secondary | ICD-10-CM | POA: Diagnosis not present

## 2020-02-27 DIAGNOSIS — M5431 Sciatica, right side: Secondary | ICD-10-CM | POA: Diagnosis not present

## 2020-02-27 DIAGNOSIS — M6281 Muscle weakness (generalized): Secondary | ICD-10-CM | POA: Diagnosis not present

## 2020-03-03 DIAGNOSIS — M6281 Muscle weakness (generalized): Secondary | ICD-10-CM | POA: Diagnosis not present

## 2020-03-03 DIAGNOSIS — M5442 Lumbago with sciatica, left side: Secondary | ICD-10-CM | POA: Diagnosis not present

## 2020-03-03 DIAGNOSIS — M5431 Sciatica, right side: Secondary | ICD-10-CM | POA: Diagnosis not present

## 2020-03-03 DIAGNOSIS — M5441 Lumbago with sciatica, right side: Secondary | ICD-10-CM | POA: Diagnosis not present

## 2020-03-03 DIAGNOSIS — M5432 Sciatica, left side: Secondary | ICD-10-CM | POA: Diagnosis not present

## 2020-03-05 DIAGNOSIS — M5442 Lumbago with sciatica, left side: Secondary | ICD-10-CM | POA: Diagnosis not present

## 2020-03-05 DIAGNOSIS — M5431 Sciatica, right side: Secondary | ICD-10-CM | POA: Diagnosis not present

## 2020-03-05 DIAGNOSIS — M6281 Muscle weakness (generalized): Secondary | ICD-10-CM | POA: Diagnosis not present

## 2020-03-05 DIAGNOSIS — M5432 Sciatica, left side: Secondary | ICD-10-CM | POA: Diagnosis not present

## 2020-03-05 DIAGNOSIS — M5441 Lumbago with sciatica, right side: Secondary | ICD-10-CM | POA: Diagnosis not present

## 2020-03-10 DIAGNOSIS — M5442 Lumbago with sciatica, left side: Secondary | ICD-10-CM | POA: Diagnosis not present

## 2020-03-10 DIAGNOSIS — M5432 Sciatica, left side: Secondary | ICD-10-CM | POA: Diagnosis not present

## 2020-03-10 DIAGNOSIS — M6281 Muscle weakness (generalized): Secondary | ICD-10-CM | POA: Diagnosis not present

## 2020-03-10 DIAGNOSIS — M5431 Sciatica, right side: Secondary | ICD-10-CM | POA: Diagnosis not present

## 2020-03-10 DIAGNOSIS — M5441 Lumbago with sciatica, right side: Secondary | ICD-10-CM | POA: Diagnosis not present

## 2020-03-12 DIAGNOSIS — M5442 Lumbago with sciatica, left side: Secondary | ICD-10-CM | POA: Diagnosis not present

## 2020-03-12 DIAGNOSIS — M5431 Sciatica, right side: Secondary | ICD-10-CM | POA: Diagnosis not present

## 2020-03-12 DIAGNOSIS — M5432 Sciatica, left side: Secondary | ICD-10-CM | POA: Diagnosis not present

## 2020-03-12 DIAGNOSIS — M5441 Lumbago with sciatica, right side: Secondary | ICD-10-CM | POA: Diagnosis not present

## 2020-03-12 DIAGNOSIS — M6281 Muscle weakness (generalized): Secondary | ICD-10-CM | POA: Diagnosis not present

## 2020-03-17 DIAGNOSIS — M5431 Sciatica, right side: Secondary | ICD-10-CM | POA: Diagnosis not present

## 2020-03-17 DIAGNOSIS — M6281 Muscle weakness (generalized): Secondary | ICD-10-CM | POA: Diagnosis not present

## 2020-03-17 DIAGNOSIS — M5441 Lumbago with sciatica, right side: Secondary | ICD-10-CM | POA: Diagnosis not present

## 2020-03-17 DIAGNOSIS — M5442 Lumbago with sciatica, left side: Secondary | ICD-10-CM | POA: Diagnosis not present

## 2020-03-17 DIAGNOSIS — M5432 Sciatica, left side: Secondary | ICD-10-CM | POA: Diagnosis not present

## 2020-03-18 ENCOUNTER — Other Ambulatory Visit: Payer: Self-pay | Admitting: Internal Medicine

## 2020-03-18 DIAGNOSIS — I25119 Atherosclerotic heart disease of native coronary artery with unspecified angina pectoris: Secondary | ICD-10-CM | POA: Diagnosis not present

## 2020-03-18 DIAGNOSIS — N1832 Chronic kidney disease, stage 3b: Secondary | ICD-10-CM | POA: Diagnosis not present

## 2020-03-18 DIAGNOSIS — Z0000 Encounter for general adult medical examination without abnormal findings: Secondary | ICD-10-CM | POA: Diagnosis not present

## 2020-03-18 DIAGNOSIS — E1159 Type 2 diabetes mellitus with other circulatory complications: Secondary | ICD-10-CM | POA: Diagnosis not present

## 2020-03-18 DIAGNOSIS — I7121 Aneurysm of the ascending aorta, without rupture: Secondary | ICD-10-CM

## 2020-03-18 DIAGNOSIS — I712 Thoracic aortic aneurysm, without rupture: Secondary | ICD-10-CM | POA: Diagnosis not present

## 2020-03-18 DIAGNOSIS — E1169 Type 2 diabetes mellitus with other specified complication: Secondary | ICD-10-CM | POA: Diagnosis not present

## 2020-03-18 DIAGNOSIS — N401 Enlarged prostate with lower urinary tract symptoms: Secondary | ICD-10-CM | POA: Diagnosis not present

## 2020-03-18 DIAGNOSIS — Z79899 Other long term (current) drug therapy: Secondary | ICD-10-CM | POA: Diagnosis not present

## 2020-03-18 DIAGNOSIS — E538 Deficiency of other specified B group vitamins: Secondary | ICD-10-CM | POA: Diagnosis not present

## 2020-03-18 DIAGNOSIS — T502X5A Adverse effect of carbonic-anhydrase inhibitors, benzothiadiazides and other diuretics, initial encounter: Secondary | ICD-10-CM | POA: Diagnosis not present

## 2020-03-18 DIAGNOSIS — E785 Hyperlipidemia, unspecified: Secondary | ICD-10-CM | POA: Diagnosis not present

## 2020-03-18 DIAGNOSIS — E876 Hypokalemia: Secondary | ICD-10-CM | POA: Diagnosis not present

## 2020-03-18 DIAGNOSIS — D696 Thrombocytopenia, unspecified: Secondary | ICD-10-CM | POA: Diagnosis not present

## 2020-03-18 DIAGNOSIS — E1121 Type 2 diabetes mellitus with diabetic nephropathy: Secondary | ICD-10-CM | POA: Diagnosis not present

## 2020-04-22 ENCOUNTER — Ambulatory Visit
Admission: RE | Admit: 2020-04-22 | Discharge: 2020-04-22 | Disposition: A | Discharge status: Home / Self Care | Payer: PPO | Source: Ambulatory Visit | Attending: Internal Medicine | Admitting: Internal Medicine

## 2020-04-22 ENCOUNTER — Other Ambulatory Visit: Payer: Self-pay

## 2020-04-22 DIAGNOSIS — I7121 Aneurysm of the ascending aorta, without rupture: Secondary | ICD-10-CM

## 2020-04-22 DIAGNOSIS — I712 Thoracic aortic aneurysm, without rupture: Secondary | ICD-10-CM | POA: Diagnosis not present

## 2020-05-01 DIAGNOSIS — I700 Atherosclerosis of aorta: Secondary | ICD-10-CM | POA: Diagnosis not present

## 2020-05-01 DIAGNOSIS — I152 Hypertension secondary to endocrine disorders: Secondary | ICD-10-CM | POA: Diagnosis not present

## 2020-05-01 DIAGNOSIS — I25119 Atherosclerotic heart disease of native coronary artery with unspecified angina pectoris: Secondary | ICD-10-CM | POA: Diagnosis not present

## 2020-05-01 DIAGNOSIS — E1169 Type 2 diabetes mellitus with other specified complication: Secondary | ICD-10-CM | POA: Diagnosis not present

## 2020-05-01 DIAGNOSIS — I712 Thoracic aortic aneurysm, without rupture: Secondary | ICD-10-CM | POA: Diagnosis not present

## 2020-05-01 DIAGNOSIS — E1159 Type 2 diabetes mellitus with other circulatory complications: Secondary | ICD-10-CM | POA: Diagnosis not present

## 2020-05-01 DIAGNOSIS — E785 Hyperlipidemia, unspecified: Secondary | ICD-10-CM | POA: Diagnosis not present

## 2020-05-04 ENCOUNTER — Ambulatory Visit: Payer: PPO | Admitting: Urology

## 2020-05-04 ENCOUNTER — Other Ambulatory Visit: Payer: Self-pay

## 2020-05-04 ENCOUNTER — Encounter: Payer: Self-pay | Admitting: Urology

## 2020-05-04 VITALS — BP 132/77 | HR 69 | Ht 71.0 in | Wt 178.0 lb

## 2020-05-04 DIAGNOSIS — R972 Elevated prostate specific antigen [PSA]: Secondary | ICD-10-CM

## 2020-05-04 DIAGNOSIS — N401 Enlarged prostate with lower urinary tract symptoms: Secondary | ICD-10-CM

## 2020-05-04 DIAGNOSIS — R351 Nocturia: Secondary | ICD-10-CM | POA: Diagnosis not present

## 2020-05-04 LAB — BLADDER SCAN AMB NON-IMAGING: Scan Result: 44

## 2020-05-04 NOTE — Progress Notes (Signed)
05/04/2020 2:09 PM   Trevor Deleon. December 01, 1931 [id number]  Referring provider: Ezequiel Kayser, MD [place] [facility] [place],  [place] [address]  Chief Complaint  Patient presents with  . Follow-up    19mth follow-up, PVR    HPI: Trevor Deleon is an 86 year old male with an elevated PSA, BPH with LU TS and nocturia who presents today for follow up with his daughter, Trevor Deleon.      Elevated PSA He has a personal history of elevated PSA. His PSA was as high as 8.0 in the past. He underwent prostate needle biopsy on 5/07 which was negative as well as in 10/2014 which was also negative. Most recent PSA in 08/2016 which was 2.65.  No further PSA has been recommended.  BPH WITH LUTS  (prostate and/or bladder) IPSS score: 3/2    PVR: 44 mL   Previous PVR: 27 mL   Major complaint(s):  Frequency  x several years. Denies any dysuria, hematuria or suprapubic pain.   Currently taking: doxazosin 8 mg daily,   His has had cooled thermal wave therapy.   Denies any recent fevers, chills, nausea or vomiting.  IPSS    Row Name 05/04/20 1300         International Prostate Symptom Score   How often have you had the sensation of not emptying your bladder? Not at All     How often have you had to urinate less than every two hours? Not at All     How often have you found you stopped and started again several times when you urinated? Not at All     How often have you found it difficult to postpone urination? Less than 1 in 5 times     How often have you had a weak urinary stream? Not at All     How often have you had to strain to start urination? Not at All     How many times did you typically get up at night to urinate? 2 Times     Total IPSS Score 3       Quality of Life due to urinary symptoms   If you were to spend the rest of your life with your urinary condition just the way it is now how would you feel about that? Mostly Satisfied            Score:   1-7 Mild 8-19 Moderate 20-35 Severe  PMH: Past Medical History:  Diagnosis Date  . Arthritis   . BPH (benign prostatic hyperplasia)   . CKD (chronic kidney disease) stage 3, GFR 30-59 ml/min   . Coronary artery disease   . Diabetes mellitus without complication ([place])    Pt does not take medications.  . Duodenum disorder    BRUNNERS GLAND  . ED (erectile dysfunction)   . GERD (gastroesophageal reflux disease)   . Hernia, femoral   . History of hiatal hernia   . Hyperlipidemia   . Hypertension   . Hypogonadism in male   . Labyrinthitis   . Myocardial infarction ([place])    1999  . Pancytopenia ([place])   . Stricture esophagus     Surgical History: Past Surgical History:  Procedure Laterality Date  . APPENDECTOMY    . CARDIAC CATHETERIZATION    . CARDIAC SURGERY    . CHOLECYSTECTOMY N/A 09/02/2015   Procedure: LAPAROSCOPIC CHOLECYSTECTOMY;  Surgeon: Leonie Green, MD;  Location: [facility] ORS;  Service: General;  Laterality: N/A;  . COLONOSCOPY    . CORONARY ANGIOPLASTY    . ERCP Left 09/03/2015   Procedure: ENDOSCOPIC RETROGRADE CHOLANGIOPANCREATOGRAPHY (ERCP);  Surgeon: Hulen Luster, MD;  Location: [facility] ENDOSCOPY;  Service: Endoscopy;  Laterality: Left;  . ESOPHAGOGASTRODUODENOSCOPY (EGD) WITH PROPOFOL N/A 08/29/2017   Procedure: ESOPHAGOGASTRODUODENOSCOPY (EGD) WITH PROPOFOL;  Surgeon: Toledo, Benay Pike, MD;  Location: [facility] ENDOSCOPY;  Service: Gastroenterology;  Laterality: N/A;  . FLEXIBLE SIGMOIDOSCOPY    . HERNIA REPAIR    . INGUINAL HERNIA REPAIR Left 12/15/2015   Procedure: HERNIA REPAIR INGUINAL ADULT;  Surgeon: Leonie Green, MD;  Location: [facility] ORS;  Service: General;  Laterality: Left;  . INTRAOPERATIVE CHOLANGIOGRAM  09/02/2015   Procedure: INTRAOPERATIVE CHOLANGIOGRAM;  Surgeon: Leonie Green, MD;  Location: [facility] ORS;  Service: General;;  . JOINT REPLACEMENT Left   . LIPOMA EXCISION    . PROSTATE SURGERY  12/17/2014  . TONSILLECTOMY      Home  Medications:  Allergies as of 05/04/2020   No Known Allergies     Medication List       Accurate as of May 04, 2020  2:09 PM. If you have any questions, ask your nurse or doctor.        STOP taking these medications   mirabegron ER 25 MG Tb24 tablet Commonly known as: MYRBETRIQ Stopped by: Zara Council, PA-C     TAKE these medications   acetaminophen 650 MG CR tablet Commonly known as: TYLENOL Take 650 mg by mouth every 6 (six) hours as needed for pain.   ALPRAZolam 0.25 MG tablet Commonly known as: XANAX Take 1 tablet by mouth at bedtime as needed for sleep.   aspirin EC 81 MG tablet Take 81 mg by mouth daily.   docusate sodium 100 MG capsule Commonly known as: COLACE Take 100 mg by mouth 2 (two) times daily.   doxazosin 8 MG tablet Commonly known as: CARDURA Take 1 tablet by mouth daily.   Fish Oil 1000 MG Caps Take 3,000 mg by mouth 2 (two) times daily.   isosorbide mononitrate 30 MG 24 hr tablet Commonly known as: IMDUR Take 15 mg by mouth daily.   METAMUCIL PO Take 1 Dose by mouth daily. Reported on 12/15/2015   MIRALAX PO Take 17 g by mouth daily.   nitroGLYCERIN 0.4 MG SL tablet Commonly known as: NITROSTAT Place 0.4 mg under the tongue. UP TO 3 DOSES   pantoprazole 40 MG tablet Commonly known as: PROTONIX Take 40 mg by mouth daily.   potassium chloride 10 MEQ tablet Commonly known as: KLOR-CON Take 1 tablet by mouth in the morning, at noon, and at bedtime.   simvastatin 40 MG tablet Commonly known as: ZOCOR Take 40 mg by mouth at bedtime.   triamterene-hydrochlorothiazide 37.5-25 MG tablet Commonly known as: MAXZIDE-25 Take 1 tablet by mouth daily.       Allergies: No Known Allergies  Family History: Family History  Problem Relation Age of Onset  . Hypertension Mother   . Stroke Mother   . Prostate cancer Father   . Heart attack Father     Social History:  reports that he has quit smoking. His smoking use included  cigarettes. He has a 15.00 pack-year smoking history. He has never used smokeless tobacco. He reports that he does not drink alcohol and does not use drugs.  ROS: Pertinent ROS in HPI  Physical Exam: BP 132/77   Pulse 69   Ht 5\' 11"  (1.803 m)  Wt 178 lb (80.7 kg)   BMI 24.83 kg/m   Constitutional:  Well nourished. Alert and oriented, No acute distress. HEENT: [place] AT, mask in place.  Trachea midline Cardiovascular: No clubbing, cyanosis, or edema. Respiratory: Normal respiratory effort, no increased work of breathing. Neurologic: Grossly intact, no focal deficits, moving all 4 extremities. Psychiatric: Normal mood and affect.  Laboratory Data: Lab Results  Component Value Date   WBC 4.7 08/29/2017   HGB 15.6 08/29/2017   HCT 44.3 08/29/2017   MCV 84.6 08/29/2017   PLT 114 (L) 08/29/2017    Lab Results  Component Value Date   CREATININE 1.18 09/04/2015    Lab Results  Component Value Date   AST 27 09/03/2015   Lab Results  Component Value Date   ALT 27 09/03/2015     Urinalysis    Component Value Date/Time   COLORURINE YELLOW 10/11/2019 0925   APPEARANCEUR CLEAR 10/11/2019 0925   LABSPEC 1.020 10/11/2019 0925   PHURINE 6.0 10/11/2019 0925   GLUCOSEU NEGATIVE 10/11/2019 0925   HGBUR NEGATIVE 10/11/2019 0925   [place] NEGATIVE 10/11/2019 0925   [place] 10/11/2019 0925   [place] NEGATIVE 10/11/2019 0925   NITRITE NEGATIVE 10/11/2019 0925   LEUKOCYTESUR NEGATIVE 10/11/2019 0925    I have reviewed the labs.   Pertinent Imaging: Results for MATHEW, POSTIGLIONE (MRN [id number]) as of 05/04/2020 13:41  Ref. Range 05/04/2020 13:34  Scan Result Unknown 44 ml    Assessment & Plan:    1. BPH associated with nocturia - BLADDER SCAN AMB NON-IMAGING IPSS score is 3/2 Continue conservative management, avoiding bladder irritants and timed voiding's Most bothersome symptoms is/are nocturia Continue doxazosin 8 mg daily RTC in 6 months for IPSS,  PVR and exam   2. Nocturia Discussed discontinuing the [employer] in the afternoon Did not purse sleep study as PCP did not feel he had many risk factors  3. Elevated PSA Likely due to prostamegaly Discontinued screening due to age   Return in about 6 months (around 11/03/2020) for IPSS, PVR and exam.  These notes generated with voice recognition software. I apologize for typographical errors.  Zara Council, PA-C  [facility]  [address]  I spent 30 minutes on the day of the encounter to include pre-visit record review, face-to-face time with the patient, and post-visit ordering of tests. [place], [place] [phone]

## 2020-06-18 DIAGNOSIS — I25119 Atherosclerotic heart disease of native coronary artery with unspecified angina pectoris: Secondary | ICD-10-CM | POA: Diagnosis not present

## 2020-07-02 DIAGNOSIS — I700 Atherosclerosis of aorta: Secondary | ICD-10-CM | POA: Diagnosis not present

## 2020-07-02 DIAGNOSIS — E1159 Type 2 diabetes mellitus with other circulatory complications: Secondary | ICD-10-CM | POA: Diagnosis not present

## 2020-07-02 DIAGNOSIS — I152 Hypertension secondary to endocrine disorders: Secondary | ICD-10-CM | POA: Diagnosis not present

## 2020-07-02 DIAGNOSIS — I25119 Atherosclerotic heart disease of native coronary artery with unspecified angina pectoris: Secondary | ICD-10-CM | POA: Diagnosis not present

## 2020-07-02 DIAGNOSIS — I712 Thoracic aortic aneurysm, without rupture: Secondary | ICD-10-CM | POA: Diagnosis not present

## 2020-07-13 DIAGNOSIS — N1832 Chronic kidney disease, stage 3b: Secondary | ICD-10-CM | POA: Diagnosis not present

## 2020-07-13 DIAGNOSIS — R350 Frequency of micturition: Secondary | ICD-10-CM | POA: Diagnosis not present

## 2020-07-13 DIAGNOSIS — I25119 Atherosclerotic heart disease of native coronary artery with unspecified angina pectoris: Secondary | ICD-10-CM | POA: Diagnosis not present

## 2020-07-13 DIAGNOSIS — R300 Dysuria: Secondary | ICD-10-CM | POA: Diagnosis not present

## 2020-07-31 DIAGNOSIS — E1159 Type 2 diabetes mellitus with other circulatory complications: Secondary | ICD-10-CM | POA: Diagnosis not present

## 2020-07-31 DIAGNOSIS — E1122 Type 2 diabetes mellitus with diabetic chronic kidney disease: Secondary | ICD-10-CM | POA: Diagnosis not present

## 2020-07-31 DIAGNOSIS — N1832 Chronic kidney disease, stage 3b: Secondary | ICD-10-CM | POA: Diagnosis not present

## 2020-07-31 DIAGNOSIS — E785 Hyperlipidemia, unspecified: Secondary | ICD-10-CM | POA: Diagnosis not present

## 2020-07-31 DIAGNOSIS — E1169 Type 2 diabetes mellitus with other specified complication: Secondary | ICD-10-CM | POA: Diagnosis not present

## 2020-07-31 DIAGNOSIS — I25119 Atherosclerotic heart disease of native coronary artery with unspecified angina pectoris: Secondary | ICD-10-CM | POA: Diagnosis not present

## 2020-07-31 DIAGNOSIS — I700 Atherosclerosis of aorta: Secondary | ICD-10-CM | POA: Diagnosis not present

## 2020-07-31 DIAGNOSIS — I152 Hypertension secondary to endocrine disorders: Secondary | ICD-10-CM | POA: Diagnosis not present

## 2020-07-31 DIAGNOSIS — I712 Thoracic aortic aneurysm, without rupture: Secondary | ICD-10-CM | POA: Diagnosis not present

## 2020-08-05 DIAGNOSIS — Z23 Encounter for immunization: Secondary | ICD-10-CM | POA: Diagnosis not present

## 2020-08-24 ENCOUNTER — Ambulatory Visit: Payer: PPO | Attending: Internal Medicine

## 2020-08-24 DIAGNOSIS — L570 Actinic keratosis: Secondary | ICD-10-CM | POA: Diagnosis not present

## 2020-08-24 DIAGNOSIS — Z872 Personal history of diseases of the skin and subcutaneous tissue: Secondary | ICD-10-CM | POA: Diagnosis not present

## 2020-08-24 DIAGNOSIS — Z859 Personal history of malignant neoplasm, unspecified: Secondary | ICD-10-CM | POA: Diagnosis not present

## 2020-08-24 DIAGNOSIS — L578 Other skin changes due to chronic exposure to nonionizing radiation: Secondary | ICD-10-CM | POA: Diagnosis not present

## 2020-08-24 DIAGNOSIS — Z86018 Personal history of other benign neoplasm: Secondary | ICD-10-CM | POA: Diagnosis not present

## 2020-08-24 DIAGNOSIS — Z23 Encounter for immunization: Secondary | ICD-10-CM

## 2020-08-24 DIAGNOSIS — B009 Herpesviral infection, unspecified: Secondary | ICD-10-CM | POA: Diagnosis not present

## 2020-08-24 NOTE — Progress Notes (Signed)
   [id number]  Name:  Trevor Deleon.    MRN: [id number] DOB: 01/26/1932  08/24/2020  [name] was observed post Covid-19 immunization for 15 minutes without incident. He was provided with Vaccine Information Sheet and instruction to access the V-Safe system.   Mr. Filter was instructed to call 911 with any severe reactions post vaccine: Marland Kitchen Difficulty breathing  . Swelling of face and throat  . A fast heartbeat  . A bad rash all over body  . Dizziness and weakness

## 2020-09-17 DIAGNOSIS — E1159 Type 2 diabetes mellitus with other circulatory complications: Secondary | ICD-10-CM | POA: Diagnosis not present

## 2020-09-17 DIAGNOSIS — E1122 Type 2 diabetes mellitus with diabetic chronic kidney disease: Secondary | ICD-10-CM | POA: Diagnosis not present

## 2020-09-17 DIAGNOSIS — E876 Hypokalemia: Secondary | ICD-10-CM | POA: Diagnosis not present

## 2020-09-17 DIAGNOSIS — D696 Thrombocytopenia, unspecified: Secondary | ICD-10-CM | POA: Diagnosis not present

## 2020-09-17 DIAGNOSIS — Z0000 Encounter for general adult medical examination without abnormal findings: Secondary | ICD-10-CM | POA: Diagnosis not present

## 2020-09-17 DIAGNOSIS — I25119 Atherosclerotic heart disease of native coronary artery with unspecified angina pectoris: Secondary | ICD-10-CM | POA: Diagnosis not present

## 2020-09-17 DIAGNOSIS — N401 Enlarged prostate with lower urinary tract symptoms: Secondary | ICD-10-CM | POA: Diagnosis not present

## 2020-09-17 DIAGNOSIS — E1169 Type 2 diabetes mellitus with other specified complication: Secondary | ICD-10-CM | POA: Diagnosis not present

## 2020-09-17 DIAGNOSIS — I700 Atherosclerosis of aorta: Secondary | ICD-10-CM | POA: Diagnosis not present

## 2020-09-17 DIAGNOSIS — E538 Deficiency of other specified B group vitamins: Secondary | ICD-10-CM | POA: Diagnosis not present

## 2020-09-17 DIAGNOSIS — K760 Fatty (change of) liver, not elsewhere classified: Secondary | ICD-10-CM | POA: Diagnosis not present

## 2020-09-17 DIAGNOSIS — Z79899 Other long term (current) drug therapy: Secondary | ICD-10-CM | POA: Diagnosis not present

## 2020-09-17 DIAGNOSIS — E785 Hyperlipidemia, unspecified: Secondary | ICD-10-CM | POA: Diagnosis not present

## 2020-09-17 DIAGNOSIS — N1832 Chronic kidney disease, stage 3b: Secondary | ICD-10-CM | POA: Diagnosis not present

## 2020-09-17 DIAGNOSIS — T502X5A Adverse effect of carbonic-anhydrase inhibitors, benzothiadiazides and other diuretics, initial encounter: Secondary | ICD-10-CM | POA: Diagnosis not present

## 2020-10-30 DIAGNOSIS — E785 Hyperlipidemia, unspecified: Secondary | ICD-10-CM | POA: Diagnosis not present

## 2020-10-30 DIAGNOSIS — E1169 Type 2 diabetes mellitus with other specified complication: Secondary | ICD-10-CM | POA: Diagnosis not present

## 2020-10-30 DIAGNOSIS — E1159 Type 2 diabetes mellitus with other circulatory complications: Secondary | ICD-10-CM | POA: Diagnosis not present

## 2020-10-30 DIAGNOSIS — I712 Thoracic aortic aneurysm, without rupture: Secondary | ICD-10-CM | POA: Diagnosis not present

## 2020-10-30 DIAGNOSIS — I25119 Atherosclerotic heart disease of native coronary artery with unspecified angina pectoris: Secondary | ICD-10-CM | POA: Diagnosis not present

## 2020-10-30 DIAGNOSIS — I700 Atherosclerosis of aorta: Secondary | ICD-10-CM | POA: Diagnosis not present

## 2020-10-30 DIAGNOSIS — I152 Hypertension secondary to endocrine disorders: Secondary | ICD-10-CM | POA: Diagnosis not present

## 2020-10-30 NOTE — Progress Notes (Signed)
11/02/2020 9:12 AM   Trevor Deleon. [birth year] [id number]  Referring provider: Ezequiel Kayser, MD [facility] [place],  [place] [address]  Chief Complaint  Patient presents with  . Follow-up    65mth follow-up with IPSS/PVR    HPI: Trevor Deleon is an 86 year old male with an elevated PSA, BPH with LU TS and nocturia who presents today for follow up with his daughter, Trevor Deleon.      Elevated PSA He has a personal history of elevated PSA. His PSA was as high as 8.0 in the past. He underwent prostate needle biopsy on 5/07 which was negative as well as in 10/2014 which was also negative. Most recent PSA in 08/2016 which was 2.65.  No further PSA has been recommended.  BPH WITH LUTS  (prostate and/or bladder) IPSS score: 4/2  PVR: 45 mL   Previous I PSS: 3/2 Previous PVR: 44 mL   Major complaint(s):  Frequency and incontinence  x several years.  Patient denies any modifying or aggravating factors.  Patient denies any gross hematuria, dysuria or suprapubic/flank pain.  Patient denies any fevers, chills, nausea or vomiting.   Currently taking: doxazosin 8 mg daily,   His has had cooled thermal wave therapy.     IPSS    Row Name 11/02/20 0900         International Prostate Symptom Score   How often have you had the sensation of not emptying your bladder? Not at All     How often have you had to urinate less than every two hours? Less than 1 in 5 times     How often have you found you stopped and started again several times when you urinated? Not at All     How often have you found it difficult to postpone urination? Less than 1 in 5 times     How often have you had a weak urinary stream? Not at All     How often have you had to strain to start urination? Not at All     How many times did you typically get up at night to urinate? 2 Times     Total IPSS Score 4           Quality of Life due to urinary symptoms   If you were to spend the rest  of your life with your urinary condition just the way it is now how would you feel about that? Mostly Satisfied            Score:  1-7 Mild 8-19 Moderate 20-35 Severe  PMH: Past Medical History:  Diagnosis Date  . Arthritis   . BPH (benign prostatic hyperplasia)   . CKD (chronic kidney disease) stage 3, GFR 30-59 ml/min (HCC)   . Coronary artery disease   . Diabetes mellitus without complication ([place])    Pt does not take medications.  . Duodenum disorder    BRUNNERS GLAND  . ED (erectile dysfunction)   . GERD (gastroesophageal reflux disease)   . Hernia, femoral   . History of hiatal hernia   . Hyperlipidemia   . Hypertension   . Hypogonadism in male   . Labyrinthitis   . Myocardial infarction ([place])    1999  . Pancytopenia ([place])   . Stricture esophagus     Surgical History: Past Surgical History:  Procedure Laterality Date  . APPENDECTOMY    . CARDIAC CATHETERIZATION    . CARDIAC  SURGERY    . CHOLECYSTECTOMY N/A 09/02/2015   Procedure: LAPAROSCOPIC CHOLECYSTECTOMY;  Surgeon: Leonie Green, MD;  Location: [facility] ORS;  Service: General;  Laterality: N/A;  . COLONOSCOPY    . CORONARY ANGIOPLASTY    . ERCP Left 09/03/2015   Procedure: ENDOSCOPIC RETROGRADE CHOLANGIOPANCREATOGRAPHY (ERCP);  Surgeon: Hulen Luster, MD;  Location: [facility] ENDOSCOPY;  Service: Endoscopy;  Laterality: Left;  . ESOPHAGOGASTRODUODENOSCOPY (EGD) WITH PROPOFOL N/A 08/29/2017   Procedure: ESOPHAGOGASTRODUODENOSCOPY (EGD) WITH PROPOFOL;  Surgeon: Toledo, Benay Pike, MD;  Location: [facility] ENDOSCOPY;  Service: Gastroenterology;  Laterality: N/A;  . FLEXIBLE SIGMOIDOSCOPY    . HERNIA REPAIR    . INGUINAL HERNIA REPAIR Left 12/15/2015   Procedure: HERNIA REPAIR INGUINAL ADULT;  Surgeon: Leonie Green, MD;  Location: [facility] ORS;  Service: General;  Laterality: Left;  . INTRAOPERATIVE CHOLANGIOGRAM  09/02/2015   Procedure: INTRAOPERATIVE CHOLANGIOGRAM;  Surgeon: Leonie Green, MD;  Location: [facility]  ORS;  Service: General;;  . JOINT REPLACEMENT Left   . LIPOMA EXCISION    . PROSTATE SURGERY  12/17/2014  . TONSILLECTOMY      Home Medications:  Allergies as of 11/02/2020   No Known Allergies     Medication List       Accurate as of November 02, 2020  9:12 AM. If you have any questions, ask your nurse or doctor.        acetaminophen 650 MG CR tablet Commonly known as: TYLENOL Take 650 mg by mouth every 6 (six) hours as needed for pain.   ALPRAZolam 0.25 MG tablet Commonly known as: XANAX Take 1 tablet by mouth at bedtime as needed for sleep.   aspirin EC 81 MG tablet Take 81 mg by mouth daily.   docusate sodium 100 MG capsule Commonly known as: COLACE Take 100 mg by mouth 2 (two) times daily.   doxazosin 8 MG tablet Commonly known as: CARDURA Take 1 tablet by mouth daily.   Fish Oil 1000 MG Caps Take 3,000 mg by mouth 2 (two) times daily.   isosorbide mononitrate 30 MG 24 hr tablet Commonly known as: IMDUR Take 15 mg by mouth daily.   METAMUCIL PO Take 1 Dose by mouth daily. Reported on 12/15/2015   MIRALAX PO Take 17 g by mouth daily.   nitroGLYCERIN 0.4 MG SL tablet Commonly known as: NITROSTAT Place 0.4 mg under the tongue. UP TO 3 DOSES   pantoprazole 40 MG tablet Commonly known as: PROTONIX Take 40 mg by mouth daily.   potassium chloride 10 MEQ tablet Commonly known as: KLOR-CON Take 1 tablet by mouth in the morning, at noon, and at bedtime.   simvastatin 40 MG tablet Commonly known as: ZOCOR Take 40 mg by mouth at bedtime.   triamterene-hydrochlorothiazide 37.5-25 MG tablet Commonly known as: MAXZIDE-25 Take 1 tablet by mouth daily.       Allergies: No Known Allergies  Family History: Family History  Problem Relation Age of Onset  . Hypertension Mother   . Stroke Mother   . Prostate cancer Father   . Heart attack Father     Social History:  reports that he has quit smoking. His smoking use included cigarettes. He has a 15.00  pack-year smoking history. He has never used smokeless tobacco. He reports that he does not drink alcohol and does not use drugs.  ROS: Pertinent ROS in HPI  Physical Exam: BP (!) 157/77   Pulse 73   Ht 5\' 11"  (1.803 m)   Wt 179  lb (81.2 kg)   BMI 24.97 kg/m   Constitutional:  Well nourished. Alert and oriented, No acute distress. HEENT: [place] AT, mask in place.  Trachea midline Cardiovascular: No clubbing, cyanosis, or edema. Respiratory: Normal respiratory effort, no increased work of breathing. GU: No CVA tenderness.  No bladder fullness or masses.  Patient with circumcised phallus.  Urethral meatus is patent.  No penile discharge. No penile lesions or rashes. Scrotum without lesions, cysts, rashes and/or edema.  Testicles are located scrotally bilaterally. No masses are appreciated in the testicles. Left and right epididymis are normal. Neurologic: Grossly intact, no focal deficits, moving all 4 extremities. Psychiatric: Normal mood and affect.  Laboratory Data: Lab Results  Component Value Date   WBC 4.7 08/29/2017   HGB 15.6 08/29/2017   HCT 44.3 08/29/2017   MCV 84.6 08/29/2017   PLT 114 (L) 08/29/2017    Lab Results  Component Value Date   CREATININE 1.18 09/04/2015    Lab Results  Component Value Date   AST 27 09/03/2015   Lab Results  Component Value Date   ALT 27 09/03/2015     Urinalysis    Component Value Date/Time   COLORURINE YELLOW 10/11/2019 0925   APPEARANCEUR CLEAR 10/11/2019 0925   LABSPEC 1.020 10/11/2019 0925   PHURINE 6.0 10/11/2019 0925   GLUCOSEU NEGATIVE 10/11/2019 0925   HGBUR NEGATIVE 10/11/2019 0925   [place] NEGATIVE 10/11/2019 0925   [place] 10/11/2019 0925   [place] NEGATIVE 10/11/2019 0925   NITRITE NEGATIVE 10/11/2019 0925   LEUKOCYTESUR NEGATIVE 10/11/2019 0925  I have reviewed the labs.   Pertinent Imaging: Results for MACAULAY, REICHER (MRN [id number]) as of 11/02/2020 09:04  Ref. Range 11/02/2020  09:02  Scan Result Unknown 45 ml     Assessment & Plan:    1. BPH associated with nocturia - BLADDER SCAN AMB NON-IMAGING IPSS score is 4/2, it is slightly worse Continue conservative management, avoiding bladder irritants and timed voiding's Most bothersome symptoms is/are frequency and post void dribbling Continue doxazosin 8 mg daily Press behind his scrotum after urinating RTC in 12 months for IPSS, PVR and exam   Return in about 1 year (around 11/02/2021) for IPSS and PVR.  These notes generated with voice recognition software. I apologize for typographical errors.  Zara Council, PA-C  [facility] [address]  [place], [place] [address] [phone]

## 2020-11-02 ENCOUNTER — Other Ambulatory Visit: Payer: Self-pay

## 2020-11-02 ENCOUNTER — Encounter: Payer: Self-pay | Admitting: Urology

## 2020-11-02 ENCOUNTER — Ambulatory Visit: Payer: PPO | Admitting: Urology

## 2020-11-02 VITALS — BP 157/77 | HR 73 | Ht 71.0 in | Wt 179.0 lb

## 2020-11-02 DIAGNOSIS — R351 Nocturia: Secondary | ICD-10-CM | POA: Diagnosis not present

## 2020-11-02 DIAGNOSIS — N401 Enlarged prostate with lower urinary tract symptoms: Secondary | ICD-10-CM | POA: Diagnosis not present

## 2020-11-02 LAB — BLADDER SCAN AMB NON-IMAGING: Scan Result: 45

## 2020-12-08 DIAGNOSIS — L2089 Other atopic dermatitis: Secondary | ICD-10-CM | POA: Diagnosis not present

## 2020-12-25 DIAGNOSIS — N1832 Chronic kidney disease, stage 3b: Secondary | ICD-10-CM | POA: Diagnosis not present

## 2020-12-25 DIAGNOSIS — M79621 Pain in right upper arm: Secondary | ICD-10-CM | POA: Diagnosis not present

## 2020-12-25 DIAGNOSIS — E538 Deficiency of other specified B group vitamins: Secondary | ICD-10-CM | POA: Diagnosis not present

## 2021-01-04 DIAGNOSIS — M25511 Pain in right shoulder: Secondary | ICD-10-CM | POA: Diagnosis not present

## 2021-01-04 DIAGNOSIS — M7541 Impingement syndrome of right shoulder: Secondary | ICD-10-CM | POA: Diagnosis not present

## 2021-01-13 DIAGNOSIS — R17 Unspecified jaundice: Secondary | ICD-10-CM | POA: Diagnosis not present

## 2021-01-13 DIAGNOSIS — K219 Gastro-esophageal reflux disease without esophagitis: Secondary | ICD-10-CM | POA: Diagnosis not present

## 2021-02-05 DIAGNOSIS — I152 Hypertension secondary to endocrine disorders: Secondary | ICD-10-CM | POA: Diagnosis not present

## 2021-02-05 DIAGNOSIS — I712 Thoracic aortic aneurysm, without rupture: Secondary | ICD-10-CM | POA: Diagnosis not present

## 2021-02-05 DIAGNOSIS — E1159 Type 2 diabetes mellitus with other circulatory complications: Secondary | ICD-10-CM | POA: Diagnosis not present

## 2021-02-05 DIAGNOSIS — I25119 Atherosclerotic heart disease of native coronary artery with unspecified angina pectoris: Secondary | ICD-10-CM | POA: Diagnosis not present

## 2021-02-05 DIAGNOSIS — E1169 Type 2 diabetes mellitus with other specified complication: Secondary | ICD-10-CM | POA: Diagnosis not present

## 2021-02-05 DIAGNOSIS — E785 Hyperlipidemia, unspecified: Secondary | ICD-10-CM | POA: Diagnosis not present

## 2021-02-05 DIAGNOSIS — I700 Atherosclerosis of aorta: Secondary | ICD-10-CM | POA: Diagnosis not present

## 2021-03-01 DIAGNOSIS — R1032 Left lower quadrant pain: Secondary | ICD-10-CM | POA: Diagnosis not present

## 2021-03-02 ENCOUNTER — Other Ambulatory Visit: Payer: Self-pay | Admitting: Surgery

## 2021-03-02 DIAGNOSIS — R1032 Left lower quadrant pain: Secondary | ICD-10-CM

## 2021-03-10 ENCOUNTER — Other Ambulatory Visit: Payer: Self-pay

## 2021-03-10 ENCOUNTER — Ambulatory Visit
Admission: RE | Admit: 2021-03-10 | Discharge: 2021-03-10 | Disposition: A | Discharge status: Home / Self Care | Payer: PPO | Source: Ambulatory Visit | Attending: Surgery | Admitting: Surgery

## 2021-03-10 DIAGNOSIS — I700 Atherosclerosis of aorta: Secondary | ICD-10-CM | POA: Diagnosis not present

## 2021-03-10 DIAGNOSIS — R1032 Left lower quadrant pain: Secondary | ICD-10-CM | POA: Insufficient documentation

## 2021-03-10 DIAGNOSIS — K5730 Diverticulosis of large intestine without perforation or abscess without bleeding: Secondary | ICD-10-CM | POA: Diagnosis not present

## 2021-03-10 DIAGNOSIS — K4090 Unilateral inguinal hernia, without obstruction or gangrene, not specified as recurrent: Secondary | ICD-10-CM | POA: Diagnosis not present

## 2021-03-10 DIAGNOSIS — N400 Enlarged prostate without lower urinary tract symptoms: Secondary | ICD-10-CM | POA: Diagnosis not present

## 2021-03-18 DIAGNOSIS — E1122 Type 2 diabetes mellitus with diabetic chronic kidney disease: Secondary | ICD-10-CM | POA: Diagnosis not present

## 2021-03-18 DIAGNOSIS — E876 Hypokalemia: Secondary | ICD-10-CM | POA: Diagnosis not present

## 2021-03-18 DIAGNOSIS — T502X5A Adverse effect of carbonic-anhydrase inhibitors, benzothiadiazides and other diuretics, initial encounter: Secondary | ICD-10-CM | POA: Diagnosis not present

## 2021-03-18 DIAGNOSIS — K760 Fatty (change of) liver, not elsewhere classified: Secondary | ICD-10-CM | POA: Diagnosis not present

## 2021-03-18 DIAGNOSIS — E785 Hyperlipidemia, unspecified: Secondary | ICD-10-CM | POA: Diagnosis not present

## 2021-03-18 DIAGNOSIS — I25119 Atherosclerotic heart disease of native coronary artery with unspecified angina pectoris: Secondary | ICD-10-CM | POA: Diagnosis not present

## 2021-03-18 DIAGNOSIS — Z0000 Encounter for general adult medical examination without abnormal findings: Secondary | ICD-10-CM | POA: Diagnosis not present

## 2021-03-18 DIAGNOSIS — E1169 Type 2 diabetes mellitus with other specified complication: Secondary | ICD-10-CM | POA: Diagnosis not present

## 2021-03-18 DIAGNOSIS — D696 Thrombocytopenia, unspecified: Secondary | ICD-10-CM | POA: Diagnosis not present

## 2021-03-18 DIAGNOSIS — Z79899 Other long term (current) drug therapy: Secondary | ICD-10-CM | POA: Diagnosis not present

## 2021-03-18 DIAGNOSIS — R5383 Other fatigue: Secondary | ICD-10-CM | POA: Diagnosis not present

## 2021-03-18 DIAGNOSIS — I712 Thoracic aortic aneurysm, without rupture: Secondary | ICD-10-CM | POA: Diagnosis not present

## 2021-03-18 DIAGNOSIS — E538 Deficiency of other specified B group vitamins: Secondary | ICD-10-CM | POA: Diagnosis not present

## 2021-03-18 DIAGNOSIS — N1832 Chronic kidney disease, stage 3b: Secondary | ICD-10-CM | POA: Diagnosis not present

## 2021-06-25 ENCOUNTER — Encounter: Payer: Self-pay | Admitting: Physician Assistant

## 2021-06-25 ENCOUNTER — Other Ambulatory Visit: Payer: Self-pay

## 2021-06-25 ENCOUNTER — Ambulatory Visit: Payer: PPO | Admitting: Physician Assistant

## 2021-06-25 VITALS — BP 143/76 | HR 87 | Ht 71.0 in | Wt 163.0 lb

## 2021-06-25 DIAGNOSIS — L258 Unspecified contact dermatitis due to other agents: Secondary | ICD-10-CM

## 2021-06-25 DIAGNOSIS — R32 Unspecified urinary incontinence: Secondary | ICD-10-CM

## 2021-06-25 NOTE — Progress Notes (Signed)
06/25/2021 4:03 PM   Trevor Deleon. 06-Nov-1932 [id number]  CC: Chief Complaint  Patient presents with   Other   HPI: Trevor Deleon. is a 87 y.o. male with PMH elevated PSA and BPH with LUTS including frequency and incontinence who presents today for evaluation of penile rash.   Today he reports an approximate 5-day history of rash behind the corona of his penis.  He states he has never had anything similar occur before.  He describes the pain as occasionally mildly burning but without pain or itch.  He suspect it may be associated with his urinary leakage.  He applied Neosporin and Vaseline to the area but reports minimal improvement.  PMH: Past Medical History:  Diagnosis Date   Arthritis    BPH (benign prostatic hyperplasia)    CKD (chronic kidney disease) stage 3, GFR 30-59 ml/min (HCC)    Coronary artery disease    Diabetes mellitus without complication (HCC)    Pt does not take medications.   Duodenum disorder    BRUNNERS GLAND   ED (erectile dysfunction)    GERD (gastroesophageal reflux disease)    Hernia, femoral    History of hiatal hernia    Hyperlipidemia    Hypertension    Hypogonadism in male    Labyrinthitis    Myocardial infarction ([place])    1999   Pancytopenia ([place])    Stricture esophagus     Surgical History: Past Surgical History:  Procedure Laterality Date   APPENDECTOMY     CARDIAC CATHETERIZATION     CARDIAC SURGERY     CHOLECYSTECTOMY N/A 09/02/2015   Procedure: LAPAROSCOPIC CHOLECYSTECTOMY;  Surgeon: Leonie Green, MD;  Location: [facility] ORS;  Service: General;  Laterality: N/A;   COLONOSCOPY     CORONARY ANGIOPLASTY     ERCP Left 09/03/2015   Procedure: ENDOSCOPIC RETROGRADE CHOLANGIOPANCREATOGRAPHY (ERCP);  Surgeon: Hulen Luster, MD;  Location: [facility] ENDOSCOPY;  Service: Endoscopy;  Laterality: Left;   ESOPHAGOGASTRODUODENOSCOPY (EGD) WITH PROPOFOL N/A 08/29/2017   Procedure: ESOPHAGOGASTRODUODENOSCOPY (EGD) WITH PROPOFOL;   Surgeon: Toledo, Benay Pike, MD;  Location: [facility] ENDOSCOPY;  Service: Gastroenterology;  Laterality: N/A;   FLEXIBLE SIGMOIDOSCOPY     HERNIA REPAIR     INGUINAL HERNIA REPAIR Left 12/15/2015   Procedure: HERNIA REPAIR INGUINAL ADULT;  Surgeon: Leonie Green, MD;  Location: [facility] ORS;  Service: General;  Laterality: Left;   INTRAOPERATIVE CHOLANGIOGRAM  09/02/2015   Procedure: INTRAOPERATIVE CHOLANGIOGRAM;  Surgeon: Leonie Green, MD;  Location: [facility] ORS;  Service: General;;   JOINT REPLACEMENT Left    LIPOMA EXCISION     PROSTATE SURGERY  12/17/2014   TONSILLECTOMY      Home Medications:  Allergies as of 06/25/2021   No Known Allergies      Medication List        Accurate as of June 25, 2021  4:03 PM. If you have any questions, ask your nurse or doctor.          acetaminophen 650 MG CR tablet Commonly known as: TYLENOL Take 650 mg by mouth every 6 (six) hours as needed for pain.   ALPRAZolam 0.25 MG tablet Commonly known as: XANAX Take 1 tablet by mouth at bedtime as needed for sleep.   aspirin EC 81 MG tablet Take 81 mg by mouth daily.   docusate sodium 100 MG capsule Commonly known as: COLACE Take 100 mg by mouth 2 (two) times daily.   doxazosin 8 MG tablet Commonly  known as: CARDURA Take 1 tablet by mouth daily.   Fish Oil 1000 MG Caps Take 3,000 mg by mouth 2 (two) times daily.   isosorbide mononitrate 30 MG 24 hr tablet Commonly known as: IMDUR Take 15 mg by mouth daily.   METAMUCIL PO Take 1 Dose by mouth daily. Reported on 12/15/2015   MIRALAX PO Take 17 g by mouth daily.   nitroGLYCERIN 0.4 MG SL tablet Commonly known as: NITROSTAT Place 0.4 mg under the tongue. UP TO 3 DOSES   pantoprazole 40 MG tablet Commonly known as: PROTONIX Take 40 mg by mouth daily.   potassium chloride 10 MEQ tablet Commonly known as: KLOR-CON Take 1 tablet by mouth in the morning, at noon, and at bedtime.   simvastatin 40 MG tablet Commonly known as:  ZOCOR Take 40 mg by mouth at bedtime.   triamterene-hydrochlorothiazide 37.5-25 MG tablet Commonly known as: MAXZIDE-25 Take 1 tablet by mouth daily.        Allergies:  No Known Allergies  Family History: Family History  Problem Relation Age of Onset   Hypertension Mother    Stroke Mother    Prostate cancer Father    Heart attack Father     Social History:   reports that he has quit smoking. His smoking use included cigarettes. He has a 15.00 pack-year smoking history. He has never used smokeless tobacco. He reports that he does not drink alcohol and does not use drugs.  Physical Exam: BP (!) 143/76   Pulse 87   Ht '5\' 11"'$  (1.803 m)   Wt 163 lb (73.9 kg)   BMI 22.73 kg/m   Constitutional:  Alert and oriented, no acute distress, nontoxic appearing HEENT: [place], AT Cardiovascular: No clubbing, cyanosis, or edema Respiratory: Normal respiratory effort, no increased work of breathing GU: Circumcised penis, moisture on the penis consistent with known incontinence.  Subtle, slightly erythematous flat papules along the circumference of the retroglandular sulcus without scaling or satellite lesions. Neurologic: Grossly intact, no focal deficits, moving all 4 extremities Psychiatric: Normal mood and affect  Assessment & Plan:   1. Dermatitis associated with moisture from urinary incontinence Mild irritation behind the glans of the penis associated with urinary incontinence.  Suspect moisture dermatitis is most likely.  Counseled patient to treat with barrier ointment.  If no improvement within 7 to 10 days, I counseled the patient to contact clinic and we will try a course of clotrimazole as an alternative.  Return if symptoms worsen or fail to improve.  Debroah Loop, PA-C  [facility] [address], [place], [place] [address] [phone]

## 2021-06-25 NOTE — Patient Instructions (Signed)
Please go buy an over-the-counter cream for diaper rash, examples include Boudreaux's Butt Paste. I'd like you to apply this to the rash twice daily for 1 week. If things do not improve, please call our office and we'll switch to to another cream.

## 2021-08-11 DIAGNOSIS — I712 Thoracic aortic aneurysm, without rupture: Secondary | ICD-10-CM | POA: Diagnosis not present

## 2021-08-11 DIAGNOSIS — E785 Hyperlipidemia, unspecified: Secondary | ICD-10-CM | POA: Diagnosis not present

## 2021-08-11 DIAGNOSIS — E1169 Type 2 diabetes mellitus with other specified complication: Secondary | ICD-10-CM | POA: Diagnosis not present

## 2021-08-11 DIAGNOSIS — E1159 Type 2 diabetes mellitus with other circulatory complications: Secondary | ICD-10-CM | POA: Diagnosis not present

## 2021-08-11 DIAGNOSIS — I152 Hypertension secondary to endocrine disorders: Secondary | ICD-10-CM | POA: Diagnosis not present

## 2021-08-12 ENCOUNTER — Other Ambulatory Visit: Payer: Self-pay | Admitting: Cardiology

## 2021-08-12 DIAGNOSIS — I7121 Aneurysm of the ascending aorta, without rupture: Secondary | ICD-10-CM

## 2021-08-12 DIAGNOSIS — I712 Thoracic aortic aneurysm, without rupture: Secondary | ICD-10-CM

## 2021-08-20 ENCOUNTER — Ambulatory Visit: Payer: PPO

## 2021-08-24 ENCOUNTER — Other Ambulatory Visit: Payer: Self-pay

## 2021-08-24 ENCOUNTER — Ambulatory Visit
Admission: RE | Admit: 2021-08-24 | Discharge: 2021-08-24 | Disposition: A | Discharge status: Home / Self Care | Payer: PPO | Source: Ambulatory Visit | Attending: Cardiology | Admitting: Cardiology

## 2021-08-24 DIAGNOSIS — I7121 Aneurysm of the ascending aorta, without rupture: Secondary | ICD-10-CM | POA: Insufficient documentation

## 2021-08-24 DIAGNOSIS — Q2546 Tortuous aortic arch: Secondary | ICD-10-CM | POA: Diagnosis not present

## 2021-08-25 DIAGNOSIS — L2089 Other atopic dermatitis: Secondary | ICD-10-CM | POA: Diagnosis not present

## 2021-08-25 DIAGNOSIS — L578 Other skin changes due to chronic exposure to nonionizing radiation: Secondary | ICD-10-CM | POA: Diagnosis not present

## 2021-08-25 DIAGNOSIS — Z859 Personal history of malignant neoplasm, unspecified: Secondary | ICD-10-CM | POA: Diagnosis not present

## 2021-08-25 DIAGNOSIS — L570 Actinic keratosis: Secondary | ICD-10-CM | POA: Diagnosis not present

## 2021-08-25 DIAGNOSIS — B009 Herpesviral infection, unspecified: Secondary | ICD-10-CM | POA: Diagnosis not present

## 2021-08-25 DIAGNOSIS — L720 Epidermal cyst: Secondary | ICD-10-CM | POA: Diagnosis not present

## 2021-08-25 DIAGNOSIS — Z872 Personal history of diseases of the skin and subcutaneous tissue: Secondary | ICD-10-CM | POA: Diagnosis not present

## 2021-08-25 DIAGNOSIS — L821 Other seborrheic keratosis: Secondary | ICD-10-CM | POA: Diagnosis not present

## 2021-08-25 DIAGNOSIS — Z86018 Personal history of other benign neoplasm: Secondary | ICD-10-CM | POA: Diagnosis not present

## 2021-09-22 DIAGNOSIS — I700 Atherosclerosis of aorta: Secondary | ICD-10-CM | POA: Diagnosis not present

## 2021-09-22 DIAGNOSIS — I7121 Aneurysm of the ascending aorta, without rupture: Secondary | ICD-10-CM | POA: Diagnosis not present

## 2021-09-22 DIAGNOSIS — N401 Enlarged prostate with lower urinary tract symptoms: Secondary | ICD-10-CM | POA: Diagnosis not present

## 2021-09-22 DIAGNOSIS — E1122 Type 2 diabetes mellitus with diabetic chronic kidney disease: Secondary | ICD-10-CM | POA: Diagnosis not present

## 2021-09-22 DIAGNOSIS — K5909 Other constipation: Secondary | ICD-10-CM | POA: Diagnosis not present

## 2021-09-22 DIAGNOSIS — E1159 Type 2 diabetes mellitus with other circulatory complications: Secondary | ICD-10-CM | POA: Diagnosis not present

## 2021-09-22 DIAGNOSIS — I152 Hypertension secondary to endocrine disorders: Secondary | ICD-10-CM | POA: Diagnosis not present

## 2021-09-22 DIAGNOSIS — R911 Solitary pulmonary nodule: Secondary | ICD-10-CM | POA: Diagnosis not present

## 2021-09-22 DIAGNOSIS — Z0000 Encounter for general adult medical examination without abnormal findings: Secondary | ICD-10-CM | POA: Diagnosis not present

## 2021-09-22 DIAGNOSIS — E1169 Type 2 diabetes mellitus with other specified complication: Secondary | ICD-10-CM | POA: Diagnosis not present

## 2021-09-22 DIAGNOSIS — E538 Deficiency of other specified B group vitamins: Secondary | ICD-10-CM | POA: Diagnosis not present

## 2021-09-22 DIAGNOSIS — I25119 Atherosclerotic heart disease of native coronary artery with unspecified angina pectoris: Secondary | ICD-10-CM | POA: Diagnosis not present

## 2021-09-22 DIAGNOSIS — D696 Thrombocytopenia, unspecified: Secondary | ICD-10-CM | POA: Diagnosis not present

## 2021-09-22 DIAGNOSIS — K760 Fatty (change of) liver, not elsewhere classified: Secondary | ICD-10-CM | POA: Diagnosis not present

## 2021-09-23 DIAGNOSIS — H259 Unspecified age-related cataract: Secondary | ICD-10-CM | POA: Diagnosis not present

## 2021-10-05 DIAGNOSIS — I25118 Atherosclerotic heart disease of native coronary artery with other forms of angina pectoris: Secondary | ICD-10-CM | POA: Diagnosis not present

## 2021-10-05 DIAGNOSIS — Z955 Presence of coronary angioplasty implant and graft: Secondary | ICD-10-CM | POA: Diagnosis not present

## 2021-10-05 DIAGNOSIS — I10 Essential (primary) hypertension: Secondary | ICD-10-CM | POA: Diagnosis not present

## 2021-10-05 DIAGNOSIS — I714 Abdominal aortic aneurysm, without rupture, unspecified: Secondary | ICD-10-CM | POA: Diagnosis not present

## 2021-10-24 NOTE — Progress Notes (Signed)
10/25/2021 10:42 AM   Trevor Deleon. 1932/09/15 [id number]  Referring provider: No referring provider defined for this encounter.  Chief Complaint  Patient presents with   Follow-up   Benign Prostatic Hypertrophy   Urological history 1. Elevated PSA -aged out of screening -PSA 2.65 in 2017  -PSA was as high as 8.0 in the past -underwent prostate biopsy in 5/07 and 10/2014 - negative  2. BPH with LU TS -TUMT in the remote past -I PSS 4/2 -PVR 57 mL  -managed with doxazosin 8 mg daily  HPI: Trevor Deleon. Is a 87 y.o. male who presents today for yearly visit with his daughter, Caren Griffins.    His rash is back.  He states it responding to the zinc oxide, but the rash returned when he decreased the cream use.    Patient denies any modifying or aggravating factors.  Patient denies any gross hematuria, dysuria or suprapubic/flank pain.  Patient denies any fevers, chills, nausea or vomiting.     IPSS     Row Name 10/25/21 1000         International Prostate Symptom Score   How often have you had the sensation of not emptying your bladder? Not at All     How often have you had to urinate less than every two hours? Less than 1 in 5 times     How often have you found you stopped and started again several times when you urinated? Not at All     How often have you found it difficult to postpone urination? Less than 1 in 5 times     How often have you had a weak urinary stream? Not at All     How often have you had to strain to start urination? Not at All     How many times did you typically get up at night to urinate? 2 Times     Total IPSS Score 4       Quality of Life due to urinary symptoms   If you were to spend the rest of your life with your urinary condition just the way it is now how would you feel about that? Mostly Satisfied              Score:  1-7 Mild 8-19 Moderate 20-35 Severe     PMH: Past Medical History:  Diagnosis Date   Arthritis     BPH (benign prostatic hyperplasia)    CKD (chronic kidney disease) stage 3, GFR 30-59 ml/min (HCC)    Coronary artery disease    Diabetes mellitus without complication (HCC)    Pt does not take medications.   Duodenum disorder    BRUNNERS GLAND   ED (erectile dysfunction)    GERD (gastroesophageal reflux disease)    Hernia, femoral    History of hiatal hernia    Hyperlipidemia    Hypertension    Hypogonadism in male    Labyrinthitis    Myocardial infarction ([place])    1999   Pancytopenia ([place])    Stricture esophagus     Surgical History: Past Surgical History:  Procedure Laterality Date   APPENDECTOMY     CARDIAC CATHETERIZATION     CARDIAC SURGERY     CHOLECYSTECTOMY N/A 09/02/2015   Procedure: LAPAROSCOPIC CHOLECYSTECTOMY;  Surgeon: Leonie Green, MD;  Location: [facility] ORS;  Service: General;  Laterality: N/A;   COLONOSCOPY     CORONARY ANGIOPLASTY     ERCP Left 09/03/2015  Procedure: ENDOSCOPIC RETROGRADE CHOLANGIOPANCREATOGRAPHY (ERCP);  Surgeon: Hulen Luster, MD;  Location: [facility] ENDOSCOPY;  Service: Endoscopy;  Laterality: Left;   ESOPHAGOGASTRODUODENOSCOPY (EGD) WITH PROPOFOL N/A 08/29/2017   Procedure: ESOPHAGOGASTRODUODENOSCOPY (EGD) WITH PROPOFOL;  Surgeon: Toledo, Benay Pike, MD;  Location: [facility] ENDOSCOPY;  Service: Gastroenterology;  Laterality: N/A;   FLEXIBLE SIGMOIDOSCOPY     HERNIA REPAIR     INGUINAL HERNIA REPAIR Left 12/15/2015   Procedure: HERNIA REPAIR INGUINAL ADULT;  Surgeon: Leonie Green, MD;  Location: [facility] ORS;  Service: General;  Laterality: Left;   INTRAOPERATIVE CHOLANGIOGRAM  09/02/2015   Procedure: INTRAOPERATIVE CHOLANGIOGRAM;  Surgeon: Leonie Green, MD;  Location: [facility] ORS;  Service: General;;   JOINT REPLACEMENT Left    LIPOMA EXCISION     PROSTATE SURGERY  12/17/2014   TONSILLECTOMY      Home Medications:  Allergies as of 10/25/2021   No Known Allergies      Medication List        Accurate as of October 25, 2021  10:42 AM. If you have any questions, ask your nurse or doctor.          STOP taking these medications    doxazosin 8 MG tablet Commonly known as: CARDURA Stopped by: Hilaria Titsworth, PA-C   potassium chloride 10 MEQ tablet Commonly known as: KLOR-CON M Stopped by: Zara Council, PA-C       TAKE these medications    acetaminophen 650 MG CR tablet Commonly known as: TYLENOL Take 650 mg by mouth every 6 (six) hours as needed for pain.   ALPRAZolam 0.25 MG tablet Commonly known as: XANAX Take 1 tablet by mouth at bedtime as needed for sleep.   aspirin EC 81 MG tablet Take 81 mg by mouth daily.   docusate sodium 100 MG capsule Commonly known as: COLACE Take 100 mg by mouth 2 (two) times daily.   Fish Oil 1000 MG Caps Take 3,000 mg by mouth 2 (two) times daily.   isosorbide mononitrate 30 MG 24 hr tablet Commonly known as: IMDUR Take 15 mg by mouth daily.   losartan 25 MG tablet Commonly known as: COZAAR Take 25 mg by mouth daily.   METAMUCIL PO Take 1 Dose by mouth daily. Reported on 12/15/2015   MIRALAX PO Take 17 g by mouth daily.   nitroGLYCERIN 0.4 MG SL tablet Commonly known as: NITROSTAT Place 0.4 mg under the tongue. UP TO 3 DOSES   nystatin-triamcinolone ointment Commonly known as: MYCOLOG Apply 1 application topically 2 (two) times daily. Started by: Zara Council, PA-C   pantoprazole 40 MG tablet Commonly known as: PROTONIX Take 40 mg by mouth daily.   simvastatin 40 MG tablet Commonly known as: ZOCOR Take 40 mg by mouth at bedtime.   tamsulosin 0.4 MG Caps capsule Commonly known as: FLOMAX Take 0.4 mg by mouth at bedtime.   triamterene-hydrochlorothiazide 37.5-25 MG tablet Commonly known as: MAXZIDE-25 Take 1 tablet by mouth daily.        Allergies: No Known Allergies  Family History: Family History  Problem Relation Age of Onset   Hypertension Mother    Stroke Mother    Prostate cancer Father    Heart attack Father      Social History:  reports that he has quit smoking. His smoking use included cigarettes. He has a 15.00 pack-year smoking history. He has never used smokeless tobacco. He reports that he does not drink alcohol and does not use drugs.  ROS: Pertinent ROS in HPI  Physical Exam: BP (!) 185/89   Pulse (!) 57   Ht '5\' 11"'  (1.803 m)   Wt 180 lb (81.6 kg)   BMI 25.10 kg/m   Constitutional:  Well nourished. Alert and oriented, No acute distress. HEENT: [place] AT, mask in place.  Trachea midline Cardiovascular: No clubbing, cyanosis, or edema. Respiratory: Normal respiratory effort, no increased work of breathing. GU: No CVA tenderness.  No bladder fullness or masses.  Patient with circumcised phallus.  Urethral meatus is patent.  No penile discharge. No penile lesions or rashes.  Subtle, slightly erythematous flat papules on the circumference of the left dorsal side of the penis just behind the coronal area.  He has a similar rash in his left groin area as well.   Neurologic: Grossly intact, no focal deficits, moving all 4 extremities. Psychiatric: Normal mood and affect.  Laboratory Data: Glucose 70 - 110 mg/dL 124 High    Sodium 136 - 145 mmol/L 142   Potassium 3.6 - 5.1 mmol/L 4.3   Chloride 97 - 109 mmol/L 107   Carbon Dioxide (CO2) 22.0 - 32.0 mmol/L 28.7   Urea Nitrogen (BUN) 7 - 25 mg/dL 16   Creatinine 0.7 - 1.3 mg/dL 1.2   Glomerular Filtration Rate (eGFR), MDRD Estimate >60 mL/min/1.73sq m 57 Low    Calcium 8.7 - 10.3 mg/dL 9.4   AST  8 - 39 U/L 18   ALT  6 - 57 U/L 13   Alk Phos (alkaline Phosphatase) 34 - 104 U/L 43   Albumin 3.5 - 4.8 g/dL 4.6   Bilirubin, Total 0.3 - 1.2 mg/dL 1.2   Protein, Total 6.1 - 7.9 g/dL 6.9   A/G Ratio 1.0 - 5.0 gm/dL 2.0   Resulting Agency  [facility]  Specimen Collected: 09/22/21 11:11 Last Resulted: 09/22/21 15:51  Received From: [facility]  Result Received: 10/05/21 13:55   Cholesterol, Total 100 - 200  mg/dL 146   Triglyceride 35 - 199 mg/dL 231 High    HDL (High Density Lipoprotein) Cholesterol 29.0 - 71.0 mg/dL 37.6   LDL Calculated 0 - 130 mg/dL 62   VLDL Cholesterol mg/dL 46   Cholesterol/HDL Ratio  3.9   Resulting Agency  [facility] - [facility]  Specimen Collected: 03/18/21 10:31 Last Resulted: 03/18/21 16:07  Received From: [facility]  Result Received: 06/24/21 10:36   Hemoglobin A1C 4.2 - 5.6 % 6.2 High    Average Blood Glucose (Calc) mg/dL 131   Resulting Agency  [facility] - LAB  Narrative Performed by [facility] - [facility] Normal Range:    4.2 - 5.6%  Increased Risk:  5.7 - 6.4%  Diabetes:        >= 6.5%  Glycemic Control for adults with diabetes:  <7%   Specimen Collected: 03/18/21 10:31 Last Resulted: 03/18/21 15:51  Received From: [facility]  Result Received: 06/24/21 10:36   Color Yellow, Violet, Light Violet, Dark Violet Yellow   Clarity Clear Clear   Specific Gravity 1.000 - 1.030 1.020   pH, Urine 5.0 - 8.0 7.0   Protein, Urinalysis Negative, Trace mg/dL Negative   Glucose, Urinalysis Negative mg/dL Negative   Ketones, Urinalysis Negative mg/dL Trace Abnormal    Blood, Urinalysis Negative Negative   Nitrite, Urinalysis Negative Negative   Leukocyte Esterase, Urinalysis Negative Negative   White Blood Cells, Urinalysis None Seen, 0-3 /hpf None Seen   Red Blood Cells, Urinalysis None Seen, 0-3 /  hpf None Seen   Bacteria, Urinalysis None Seen /hpf None Seen   Squamous Epithelial Cells, Urinalysis Rare, Few, None Seen /hpf None Seen   Resulting Agency  [facility] - LAB  Specimen Collected: 03/18/21 10:31 Last Resulted: 03/18/21 11:51  Received From: [facility]  Result Received: 06/24/21 10:36   Thyroid Stimulating Hormone (TSH) 0.450-5.330 uIU/ml uIU/mL 1.897   Resulting Agency  [facility] - LAB  Specimen Collected: 03/18/21 10:31 Last Resulted: 03/18/21 18:08   Received From: [place]  Result Received: 06/24/21 10:36   WBC (White Blood Cell Count) 4.1 - 10.2 10^3/uL 4.0 Low    RBC (Red Blood Cell Count) 4.69 - 6.13 10^6/uL 5.19   Hemoglobin 14.1 - 18.1 gm/dL 15.6   Hematocrit 40.0 - 52.0 % 44.9   MCV (Mean Corpuscular Volume) 80.0 - 100.0 fl 86.5   MCH (Mean Corpuscular Hemoglobin) 27.0 - 31.2 pg 30.1   MCHC (Mean Corpuscular Hemoglobin Concentration) 32.0 - 36.0 gm/dL 34.7   Platelet Count 150 - 450 10^3/uL 85 Low    RDW-CV (Red Cell Distribution Width) 11.6 - 14.8 % 12.7   MPV (Mean Platelet Volume) 9.4 - 12.4 fl 9.7   Neutrophils 1.50 - 7.80 10^3/uL 2.20   Lymphocytes 1.00 - 3.60 10^3/uL 1.50   Mixed Count 0.10 - 0.90 10^3/uL 0.30   Neutrophil % 32.0 - 70.0 % 55.1   Lymphocyte % 10.0 - 50.0 % 38.1   Mixed % 3.0 - 14.4 % 6.8   Resulting Agency  [facility] - LAB  Narrative Performed by [facility] Results verified by repeat testing  Results consistent with patient history  Slide reviewed 09/17/20 Specimen Collected: 03/18/21 10:31 Last Resulted: 03/18/21 12:01  Received From: [facility]  Result Received: 06/24/21 10:36  I have reviewed the labs.   Pertinent Imaging: N/A  Assessment & Plan:    1. BPH with LUTS -UA benign -PVR < 300 cc -continue conservative management, avoiding bladder irritants and timed voiding's -Continue tamsulosin 0.4 mg daily  2. Tinea cruris -prescribed Nystatin cream to use twice daily   Return in about 1 year (around 10/25/2022) for IPSS and PVR.  These notes generated with voice recognition software. I apologize for typographical errors.  Zara Council, PA-C  [facility] [address]  [place], [place] [address] [phone]

## 2021-10-25 ENCOUNTER — Other Ambulatory Visit: Payer: Self-pay

## 2021-10-25 ENCOUNTER — Encounter: Payer: Self-pay | Admitting: Urology

## 2021-10-25 ENCOUNTER — Ambulatory Visit: Payer: PPO | Admitting: Urology

## 2021-10-25 VITALS — BP 185/89 | HR 57 | Ht 71.0 in | Wt 180.0 lb

## 2021-10-25 DIAGNOSIS — R32 Unspecified urinary incontinence: Secondary | ICD-10-CM

## 2021-10-25 DIAGNOSIS — N401 Enlarged prostate with lower urinary tract symptoms: Secondary | ICD-10-CM | POA: Diagnosis not present

## 2021-10-25 DIAGNOSIS — L258 Unspecified contact dermatitis due to other agents: Secondary | ICD-10-CM

## 2021-10-25 DIAGNOSIS — R351 Nocturia: Secondary | ICD-10-CM

## 2021-10-25 LAB — BLADDER SCAN AMB NON-IMAGING

## 2021-10-25 MED ORDER — NYSTATIN-TRIAMCINOLONE 100000-0.1 UNIT/GM-% EX OINT: 1.0000 "application " | TOPICAL_OINTMENT | Freq: Two times a day (BID) | CUTANEOUS | 3 refills | Status: DC

## 2021-11-01 DIAGNOSIS — E1122 Type 2 diabetes mellitus with diabetic chronic kidney disease: Secondary | ICD-10-CM | POA: Diagnosis not present

## 2021-11-01 DIAGNOSIS — E785 Hyperlipidemia, unspecified: Secondary | ICD-10-CM | POA: Diagnosis not present

## 2021-11-01 DIAGNOSIS — I25119 Atherosclerotic heart disease of native coronary artery with unspecified angina pectoris: Secondary | ICD-10-CM | POA: Diagnosis not present

## 2021-11-01 DIAGNOSIS — I152 Hypertension secondary to endocrine disorders: Secondary | ICD-10-CM | POA: Diagnosis not present

## 2021-11-01 DIAGNOSIS — E1169 Type 2 diabetes mellitus with other specified complication: Secondary | ICD-10-CM | POA: Diagnosis not present

## 2021-11-01 DIAGNOSIS — N1832 Chronic kidney disease, stage 3b: Secondary | ICD-10-CM | POA: Diagnosis not present

## 2021-11-01 DIAGNOSIS — E1159 Type 2 diabetes mellitus with other circulatory complications: Secondary | ICD-10-CM | POA: Diagnosis not present

## 2021-11-28 NOTE — Progress Notes (Signed)
11/29/2021 10:58 AM   Trevor Deleon. 13-Sep-1932 [id number]  Referring provider: No referring provider defined for this encounter.  Chief Complaint  Patient presents with   Follow-up    Rash on scrotum   Urological history 1. Elevated PSA -aged out of screening -PSA 2.65 in 2017  -PSA was as high as 8.0 in the past -underwent prostate biopsy in 5/07 and 10/2014 - negative  2. BPH with LU TS -TUMT in the remote past -I PSS 4/2 -PVR 57 mL  -managed with doxazosin 8 mg daily  HPI: Trevor Deleon. Is a 88 y.o. male who presents today for red spots on his scrotum that bleed.  He had noticed blood spots on his underwear on 12/29 and 12/30.  He looked it up the Internet and wondered if it was angiokeratomas.    Patient denies any modifying or aggravating factors.  Patient denies any gross hematuria, dysuria or suprapubic/flank pain.  Patient denies any fevers, chills, nausea or vomiting.    He had been having issues with constipation and hemorrhoids since the holiday.  He has increased his MiraLax and that has solved the issue.    PMH: Past Medical History:  Diagnosis Date   Arthritis    BPH (benign prostatic hyperplasia)    CKD (chronic kidney disease) stage 3, GFR 30-59 ml/min (HCC)    Coronary artery disease    Diabetes mellitus without complication (HCC)    Pt does not take medications.   Duodenum disorder    BRUNNERS GLAND   ED (erectile dysfunction)    GERD (gastroesophageal reflux disease)    Hernia, femoral    History of hiatal hernia    Hyperlipidemia    Hypertension    Hypogonadism in male    Labyrinthitis    Myocardial infarction ([place])    1999   Pancytopenia ([place])    Stricture esophagus     Surgical History: Past Surgical History:  Procedure Laterality Date   APPENDECTOMY     CARDIAC CATHETERIZATION     CARDIAC SURGERY     CHOLECYSTECTOMY N/A 09/02/2015   Procedure: LAPAROSCOPIC CHOLECYSTECTOMY;  Surgeon: Leonie Green, MD;   Location: [facility] ORS;  Service: General;  Laterality: N/A;   COLONOSCOPY     CORONARY ANGIOPLASTY     ERCP Left 09/03/2015   Procedure: ENDOSCOPIC RETROGRADE CHOLANGIOPANCREATOGRAPHY (ERCP);  Surgeon: Hulen Luster, MD;  Location: [facility] ENDOSCOPY;  Service: Endoscopy;  Laterality: Left;   ESOPHAGOGASTRODUODENOSCOPY (EGD) WITH PROPOFOL N/A 08/29/2017   Procedure: ESOPHAGOGASTRODUODENOSCOPY (EGD) WITH PROPOFOL;  Surgeon: Toledo, Benay Pike, MD;  Location: [facility] ENDOSCOPY;  Service: Gastroenterology;  Laterality: N/A;   FLEXIBLE SIGMOIDOSCOPY     HERNIA REPAIR     INGUINAL HERNIA REPAIR Left 12/15/2015   Procedure: HERNIA REPAIR INGUINAL ADULT;  Surgeon: Leonie Green, MD;  Location: [facility] ORS;  Service: General;  Laterality: Left;   INTRAOPERATIVE CHOLANGIOGRAM  09/02/2015   Procedure: INTRAOPERATIVE CHOLANGIOGRAM;  Surgeon: Leonie Green, MD;  Location: [facility] ORS;  Service: General;;   JOINT REPLACEMENT Left    LIPOMA EXCISION     PROSTATE SURGERY  12/17/2014   TONSILLECTOMY      Home Medications:  Allergies as of 11/29/2021   No Known Allergies      Medication List        Accurate as of November 29, 2021 10:58 AM. If you have any questions, ask your nurse or doctor.          STOP taking  these medications    isosorbide mononitrate 30 MG 24 hr tablet Commonly known as: IMDUR Stopped by: Tyreanna Bisesi, PA-C   triamterene-hydrochlorothiazide 37.5-25 MG tablet Commonly known as: MAXZIDE-25 Stopped by: Zara Council, PA-C       TAKE these medications    acetaminophen 650 MG CR tablet Commonly known as: TYLENOL Take 650 mg by mouth every 6 (six) hours as needed for pain.   ALPRAZolam 0.25 MG tablet Commonly known as: XANAX Take 1 tablet by mouth at bedtime as needed for sleep.   aspirin EC 81 MG tablet Take 81 mg by mouth daily.   docusate sodium 100 MG capsule Commonly known as: COLACE Take 100 mg by mouth 2 (two) times daily.   Fish Oil 1000 MG Caps Take  3,000 mg by mouth 2 (two) times daily.   losartan 25 MG tablet Commonly known as: COZAAR Take 25 mg by mouth daily.   METAMUCIL PO Take 1 Dose by mouth daily. Reported on 12/15/2015   MIRALAX PO Take 17 g by mouth daily.   nitroGLYCERIN 0.4 MG SL tablet Commonly known as: NITROSTAT Place 0.4 mg under the tongue. UP TO 3 DOSES   nystatin-triamcinolone ointment Commonly known as: MYCOLOG Apply 1 application topically 2 (two) times daily.   pantoprazole 40 MG tablet Commonly known as: PROTONIX Take 40 mg by mouth daily.   simvastatin 40 MG tablet Commonly known as: ZOCOR Take 40 mg by mouth at bedtime.   tamsulosin 0.4 MG Caps capsule Commonly known as: FLOMAX Take 0.4 mg by mouth at bedtime.        Allergies: No Known Allergies  Family History: Family History  Problem Relation Age of Onset   Hypertension Mother    Stroke Mother    Prostate cancer Father    Heart attack Father     Social History:  reports that he has quit smoking. His smoking use included cigarettes. He has a 15.00 pack-year smoking history. He has never used smokeless tobacco. He reports that he does not drink alcohol and does not use drugs.  ROS: Pertinent ROS in HPI  Physical Exam: BP (!) 148/79    Pulse 87    Ht 5\' 11"  (1.803 m)    Wt 180 lb (81.6 kg)    BMI 25.10 kg/m   Constitutional:  Well nourished. Alert and oriented, No acute distress. HEENT: [place] AT, mask in place.  Trachea midline Cardiovascular: No clubbing, cyanosis, or edema. Respiratory: Normal respiratory effort, no increased work of breathing. GU: No CVA tenderness.  No bladder fullness or masses.  Patient with uncircumcised phallus. Foreskin easily retracted  Urethral meatus is patent.  No penile discharge. No penile lesions or rashes. Scrotum with red and blue dots around the scrotum.   Neurologic: Grossly intact, no focal deficits, moving all 4 extremities. Psychiatric: Normal mood and affect.   Laboratory  Data: N/A   Pertinent Imaging: N/A  Assessment & Plan:    1. Angiokeratomas -explained that angiokeratomas are hard bumps on the skin. The condition occurs when tiny blood vessels called capillaries get bigger or break open near the skin's surface and the surface of the skin thickens and they are a type of noncancerous (benign) tumor.   2. BPH with LUTS -continue tamsulosin 0.4 mg daily  3. Tinea cruris -resolved  Return for keep appointment in 10/2022 .  These notes generated with voice recognition software. I apologize for typographical errors.  Zara Council, [facility] [address]  [place], [place] [address] [phone]

## 2021-11-29 ENCOUNTER — Other Ambulatory Visit: Payer: Self-pay

## 2021-11-29 ENCOUNTER — Encounter: Payer: Self-pay | Admitting: Urology

## 2021-11-29 ENCOUNTER — Ambulatory Visit: Payer: PPO | Admitting: Urology

## 2021-11-29 VITALS — BP 148/79 | HR 87 | Ht 71.0 in | Wt 180.0 lb

## 2021-11-29 DIAGNOSIS — D239 Other benign neoplasm of skin, unspecified: Secondary | ICD-10-CM

## 2021-12-13 ENCOUNTER — Emergency Department: Payer: PPO

## 2021-12-13 ENCOUNTER — Other Ambulatory Visit: Payer: Self-pay

## 2021-12-13 ENCOUNTER — Inpatient Hospital Stay
Admission: EM | Admit: 2021-12-13 | Discharge: 2021-12-16 | DRG: 281 | Disposition: A | Discharge status: Other Acute Inpatient Hospital | Payer: PPO | Attending: Internal Medicine | Admitting: Internal Medicine

## 2021-12-13 DIAGNOSIS — Z87891 Personal history of nicotine dependence: Secondary | ICD-10-CM

## 2021-12-13 DIAGNOSIS — Z20822 Contact with and (suspected) exposure to covid-19: Secondary | ICD-10-CM | POA: Diagnosis present

## 2021-12-13 DIAGNOSIS — R351 Nocturia: Secondary | ICD-10-CM | POA: Diagnosis present

## 2021-12-13 DIAGNOSIS — I200 Unstable angina: Secondary | ICD-10-CM | POA: Diagnosis present

## 2021-12-13 DIAGNOSIS — N183 Chronic kidney disease, stage 3 unspecified: Secondary | ICD-10-CM | POA: Diagnosis present

## 2021-12-13 DIAGNOSIS — Z9049 Acquired absence of other specified parts of digestive tract: Secondary | ICD-10-CM

## 2021-12-13 DIAGNOSIS — R778 Other specified abnormalities of plasma proteins: Secondary | ICD-10-CM

## 2021-12-13 DIAGNOSIS — Z66 Do not resuscitate: Secondary | ICD-10-CM | POA: Diagnosis present

## 2021-12-13 DIAGNOSIS — Z7982 Long term (current) use of aspirin: Secondary | ICD-10-CM

## 2021-12-13 DIAGNOSIS — Z79899 Other long term (current) drug therapy: Secondary | ICD-10-CM

## 2021-12-13 DIAGNOSIS — I252 Old myocardial infarction: Secondary | ICD-10-CM

## 2021-12-13 DIAGNOSIS — Z955 Presence of coronary angioplasty implant and graft: Secondary | ICD-10-CM

## 2021-12-13 DIAGNOSIS — I2510 Atherosclerotic heart disease of native coronary artery without angina pectoris: Secondary | ICD-10-CM | POA: Diagnosis present

## 2021-12-13 DIAGNOSIS — R079 Chest pain, unspecified: Secondary | ICD-10-CM | POA: Diagnosis not present

## 2021-12-13 DIAGNOSIS — Z823 Family history of stroke: Secondary | ICD-10-CM

## 2021-12-13 DIAGNOSIS — I214 Non-ST elevation (NSTEMI) myocardial infarction: Secondary | ICD-10-CM | POA: Diagnosis not present

## 2021-12-13 DIAGNOSIS — N179 Acute kidney failure, unspecified: Secondary | ICD-10-CM | POA: Diagnosis present

## 2021-12-13 DIAGNOSIS — Z8249 Family history of ischemic heart disease and other diseases of the circulatory system: Secondary | ICD-10-CM

## 2021-12-13 DIAGNOSIS — E785 Hyperlipidemia, unspecified: Secondary | ICD-10-CM | POA: Diagnosis present

## 2021-12-13 DIAGNOSIS — R0789 Other chest pain: Secondary | ICD-10-CM | POA: Diagnosis not present

## 2021-12-13 DIAGNOSIS — N2889 Other specified disorders of kidney and ureter: Secondary | ICD-10-CM | POA: Diagnosis present

## 2021-12-13 DIAGNOSIS — I129 Hypertensive chronic kidney disease with stage 1 through stage 4 chronic kidney disease, or unspecified chronic kidney disease: Secondary | ICD-10-CM | POA: Diagnosis present

## 2021-12-13 DIAGNOSIS — Z8042 Family history of malignant neoplasm of prostate: Secondary | ICD-10-CM

## 2021-12-13 DIAGNOSIS — K219 Gastro-esophageal reflux disease without esophagitis: Secondary | ICD-10-CM | POA: Diagnosis present

## 2021-12-13 DIAGNOSIS — D61818 Other pancytopenia: Secondary | ICD-10-CM | POA: Diagnosis present

## 2021-12-13 DIAGNOSIS — N1832 Chronic kidney disease, stage 3b: Secondary | ICD-10-CM | POA: Diagnosis present

## 2021-12-13 DIAGNOSIS — N401 Enlarged prostate with lower urinary tract symptoms: Secondary | ICD-10-CM | POA: Diagnosis present

## 2021-12-13 DIAGNOSIS — E1122 Type 2 diabetes mellitus with diabetic chronic kidney disease: Secondary | ICD-10-CM | POA: Diagnosis present

## 2021-12-13 DIAGNOSIS — F411 Generalized anxiety disorder: Secondary | ICD-10-CM | POA: Diagnosis present

## 2021-12-13 LAB — URINALYSIS, ROUTINE W REFLEX MICROSCOPIC
Bilirubin Urine: NEGATIVE
Glucose, UA: NEGATIVE mg/dL
Hgb urine dipstick: NEGATIVE
Ketones, ur: 20 mg/dL — AB
Leukocytes,Ua: NEGATIVE
Nitrite: NEGATIVE
Protein, ur: 30 mg/dL — AB
Specific Gravity, Urine: 1.03 (ref 1.005–1.030)
pH: 5 (ref 5.0–8.0)

## 2021-12-13 LAB — CBC
HCT: 41.8 % (ref 39.0–52.0)
HCT: 38.8 % — ABNORMAL LOW (ref 39.0–52.0)
Hemoglobin: 14.7 g/dL (ref 13.0–17.0)
Hemoglobin: 14.2 g/dL (ref 13.0–17.0)
MCH: 30.1 pg (ref 26.0–34.0)
MCH: 30.9 pg (ref 26.0–34.0)
MCHC: 35.2 g/dL (ref 30.0–36.0)
MCHC: 36.6 g/dL — ABNORMAL HIGH (ref 30.0–36.0)
MCV: 85.5 fL (ref 80.0–100.0)
MCV: 84.5 fL (ref 80.0–100.0)
Platelets: 90 10*3/uL — ABNORMAL LOW (ref 150–400)
Platelets: 98 10*3/uL — ABNORMAL LOW (ref 150–400)
RBC: 4.89 MIL/uL (ref 4.22–5.81)
RBC: 4.59 MIL/uL (ref 4.22–5.81)
RDW: 12.7 % (ref 11.5–15.5)
RDW: 12.5 % (ref 11.5–15.5)
WBC: 4.9 10*3/uL (ref 4.0–10.5)
WBC: 4.7 10*3/uL (ref 4.0–10.5)
nRBC: 0 % (ref 0.0–0.2)
nRBC: 0 % (ref 0.0–0.2)

## 2021-12-13 LAB — BASIC METABOLIC PANEL
Anion gap: 10 (ref 5–15)
BUN: 27 mg/dL — ABNORMAL HIGH (ref 8–23)
CO2: 27 mmol/L (ref 22–32)
Calcium: 9.3 mg/dL (ref 8.9–10.3)
Chloride: 100 mmol/L (ref 98–111)
Creatinine, Ser: 1.56 mg/dL — ABNORMAL HIGH (ref 0.61–1.24)
GFR, Estimated: 43 mL/min — ABNORMAL LOW (ref >60)
Glucose, Bld: 141 mg/dL — ABNORMAL HIGH (ref 70–99)
Potassium: 3.7 mmol/L (ref 3.5–5.1)
Sodium: 137 mmol/L (ref 135–145)

## 2021-12-13 LAB — PROTIME-INR
INR: 1.1 (ref 0.8–1.2)
Prothrombin Time: 13.8 seconds (ref 11.4–15.2)

## 2021-12-13 LAB — CREATININE, SERUM
Creatinine, Ser: 1.42 mg/dL — ABNORMAL HIGH (ref 0.61–1.24)
GFR, Estimated: 48 mL/min — ABNORMAL LOW (ref >60)

## 2021-12-13 LAB — TROPONIN I (HIGH SENSITIVITY)
Troponin I (High Sensitivity): 32 ng/L — ABNORMAL HIGH (ref <18)
Troponin I (High Sensitivity): 54 ng/L — ABNORMAL HIGH (ref <18)
Troponin I (High Sensitivity): 87 ng/L — ABNORMAL HIGH (ref <18)

## 2021-12-13 LAB — PATHOLOGIST SMEAR REVIEW

## 2021-12-13 LAB — SAVE SMEAR(SSMR), FOR PROVIDER SLIDE REVIEW

## 2021-12-13 LAB — RESP PANEL BY RT-PCR (FLU A&B, COVID) ARPGX2
Influenza A by PCR: NEGATIVE
Influenza B by PCR: NEGATIVE
SARS Coronavirus 2 by RT PCR: NEGATIVE

## 2021-12-13 LAB — APTT: aPTT: 39 seconds — ABNORMAL HIGH (ref 24–36)

## 2021-12-13 LAB — HIV ANTIBODY (ROUTINE TESTING W REFLEX): HIV Screen 4th Generation wRfx: NONREACTIVE

## 2021-12-13 MED ORDER — SIMVASTATIN 20 MG PO TABS
40.0000 mg | ORAL_TABLET | Freq: Every day | ORAL | Status: DC
  Administered 2021-12-14 – 2021-12-15 (×3): 40 mg via ORAL
  Filled 2021-12-13: qty 4
  Filled 2021-12-13 (×2): qty 2

## 2021-12-13 MED ORDER — ALPRAZOLAM 0.25 MG PO TABS
0.2500 mg | ORAL_TABLET | Freq: Every evening | ORAL | Status: DC | PRN
  Administered 2021-12-16: 0.25 mg via ORAL
  Filled 2021-12-13: qty 1

## 2021-12-13 MED ORDER — ENOXAPARIN SODIUM 40 MG/0.4ML IJ SOSY
40.0000 mg | PREFILLED_SYRINGE | INTRAMUSCULAR | Status: DC
  Administered 2021-12-13: 40 mg via SUBCUTANEOUS
  Filled 2021-12-13: qty 0.4

## 2021-12-13 MED ORDER — IOHEXOL 350 MG/ML SOLN
80.0000 mL | Freq: Once | INTRAVENOUS | Status: AC | PRN
  Administered 2021-12-13: 80 mL via INTRAVENOUS

## 2021-12-13 MED ORDER — ASPIRIN EC 81 MG PO TBEC
81.0000 mg | DELAYED_RELEASE_TABLET | Freq: Every day | ORAL | Status: DC
  Administered 2021-12-14 – 2021-12-16 (×3): 81 mg via ORAL
  Filled 2021-12-13 (×3): qty 1

## 2021-12-13 MED ORDER — HEPARIN (PORCINE) 25000 UT/250ML-% IV SOLN
1000.0000 [IU]/h | INTRAVENOUS | Status: DC
  Administered 2021-12-13: 1000 [IU]/h via INTRAVENOUS
  Filled 2021-12-13: qty 250

## 2021-12-13 MED ORDER — FAMOTIDINE IN NACL 20-0.9 MG/50ML-% IV SOLN
20.0000 mg | Freq: Once | INTRAVENOUS | Status: AC
  Administered 2021-12-13: 20 mg via INTRAVENOUS
  Filled 2021-12-13: qty 50

## 2021-12-13 MED ORDER — PANTOPRAZOLE SODIUM 40 MG PO TBEC
40.0000 mg | DELAYED_RELEASE_TABLET | Freq: Every day | ORAL | Status: DC
  Administered 2021-12-14 – 2021-12-16 (×3): 40 mg via ORAL
  Filled 2021-12-13 (×3): qty 1

## 2021-12-13 MED ORDER — LOSARTAN POTASSIUM 25 MG PO TABS
25.0000 mg | ORAL_TABLET | Freq: Every day | ORAL | Status: DC
  Administered 2021-12-14 – 2021-12-15 (×2): 25 mg via ORAL
  Filled 2021-12-13 (×2): qty 1

## 2021-12-13 MED ORDER — DOXAZOSIN MESYLATE 8 MG PO TABS
8.0000 mg | ORAL_TABLET | Freq: Every day | ORAL | Status: DC
  Filled 2021-12-13 (×2): qty 1

## 2021-12-13 NOTE — ED Notes (Signed)
Admitting MD at BS.  

## 2021-12-13 NOTE — ED Notes (Signed)
EDP at BS 

## 2021-12-13 NOTE — ED Provider Triage Note (Signed)
Emergency Medicine Provider Triage Evaluation Note  Trevor Deleon , a 88 y.o. male  was evaluated in triage.  Pt complains of chest pain with onset this morning.  Patient reports via EMS that he woke up at 6 AM and took 1 nitroglycerin prior to arrival.  He reports increased pain after nitro.  He describes chest pain with associated shortness of breath.  Review of Systems  Positive: CP, SOB, dysuria Negative: NV  Physical Exam  BP 119/70    Pulse 93    Temp 98.2 F (36.8 C) (Oral)    Resp 18    Ht 5\' 11"  (1.803 m)    Wt 81 kg    SpO2 98%    BMI 24.91 kg/m  Gen:   Awake, no distress   Resp:  Normal effort CTA MSK:   Moves extremities without difficulty  Other:  CVS: RRR  Medical Decision Making  Medically screening exam initiated at 11:29 AM.  Appropriate orders placed.  Trevor Deleon. was informed that the remainder of the evaluation will be completed by another provider, this initial triage assessment does not replace that evaluation, and the importance of remaining in the ED until their evaluation is complete.  Geriatric patient with ED evaluation of central chest pain and SOB.   Melvenia Needles, PA-C 12/13/21 1141

## 2021-12-13 NOTE — ED Notes (Signed)
Alert, NAD, calm, interactive, to CT at this time, family present.

## 2021-12-13 NOTE — ED Triage Notes (Signed)
First Nurse Note:  Arrives via [facility]>  C?O chest pain this morning. Woke patient up at 0600.  Currently 2/10.  1 NTG taken PTA, states pain worsened after NTG.  325 mg ASA taken at home.  AO x4  126/82 98 - SAt 98 P 98 T  Cbg:  140  EKG:  NSR.  18g LAC

## 2021-12-13 NOTE — ED Provider Notes (Signed)
[facility] Provider Note    Event Date/Time   First MD Initiated Contact with Patient 12/13/21 1235     (approximate)   History   Chest Pain   HPI  Trevor Deleon. is a 88 y.o. male with a history of CAD status post MI and catheterization in 1999, ascending aortic aneurysm, DM, CKD, hypertension, and BPH who presents with chest pain, acute onset when he awoke this morning around 4 AM, constant since then although somewhat less intense now.  He states that it got worse this morning after he took a nitroglycerin.  It is associated with some shortness of breath but not with any nausea.  The patient has had some lightheadedness over the last couple of months but it is not any worse today.  He denies any leg swelling.    Physical Exam   Triage Vital Signs: ED Triage Vitals [12/13/21 1122]  Enc Vitals Group     BP 119/70     Pulse Rate 93     Resp 18     Temp 98.2 F (36.8 C)     Temp Source Oral     SpO2 98 %     Weight 178 lb 9.2 oz (81 kg)     Height 5\' 11"  (1.803 m)     Head Circumference      Peak Flow      Pain Score 2     Pain Loc      Pain Edu?      Excl. in [place]?     Most recent vital signs: Vitals:   12/13/21 1122 12/13/21 1230  BP: 119/70 (!) 155/86  Pulse: 93 73  Resp: 18 16  Temp: 98.2 F (36.8 C)   SpO2: 98% 99%     General: Awake, well appearing for age, no distress.  CV:  Good peripheral perfusion.  Normal heart sounds. Resp:  Normal effort.  Lungs CTAB. Abd:  No distention.  Other:  No peripheral edema.   ED Results / Procedures / Treatments   Labs (all labs ordered are listed, but only abnormal results are displayed) Labs Reviewed  BASIC METABOLIC PANEL - Abnormal; Notable for the following components:      Result Value   Glucose, Bld 141 (*)    BUN 27 (*)    Creatinine, Ser 1.56 (*)    GFR, Estimated 43 (*)    All other components within normal limits  CBC - Abnormal; Notable for the following  components:   Platelets 90 (*)    All other components within normal limits  URINALYSIS, ROUTINE W REFLEX MICROSCOPIC - Abnormal; Notable for the following components:   Color, Urine AMBER (*)    APPearance HAZY (*)    Ketones, ur 20 (*)    Protein, ur 30 (*)    Bacteria, UA RARE (*)    All other components within normal limits  TROPONIN I (HIGH SENSITIVITY) - Abnormal; Notable for the following components:   Troponin I (High Sensitivity) 32 (*)    All other components within normal limits  TROPONIN I (HIGH SENSITIVITY) - Abnormal; Notable for the following components:   Troponin I (High Sensitivity) 54 (*)    All other components within normal limits     EKG  ED ECG REPORT I, Arta Silence, the attending physician, personally viewed and interpreted this ECG.  Date: 12/13/2021 EKG Time: 1124 Rate: 88 Rhythm: normal sinus rhythm with PVCs QRS Axis: normal Intervals: normal ST/T  Wave abnormalities: normal Narrative Interpretation: no evidence of acute ischemia    RADIOLOGY  Chest x-ray interpreted by me shows no focal consolidation or edema  CT angio chest/abdomen/pelvis: I reviewed the radiology which shows no acute abnormality, as follows:  IMPRESSION:  No acute vascular finding. Coronary artery calcification. Aortic  atherosclerotic calcification. Maximal diameter of the ascending  aorta measured at 3.8 cm. Aortic atherosclerosis without evidence of  dissection or penetrating ulcer.     Abdominal aortic atherosclerosis. Maximal diameter of the infrarenal  abdominal aorta is only 2.4 cm. Atherosclerosis of the aortic branch  vessels, most notably 40% stenosis of the right renal artery and 50%  stenosis of the inferior mesenteric artery.     No acute lung finding. Stable 4 mm nodule in the right middle lobe,  as distant as 04/23/2019, therefore benign.     No acute abdominal organ pathology visible. Enlargement of the  prostate gland. Some atrophic changes  of the kidneys.    PROCEDURES:  Critical Care performed: No  Procedures   MEDICATIONS ORDERED IN ED: Medications  famotidine (PEPCID) IVPB 20 mg premix (20 mg Intravenous New Bag/Given 12/13/21 1428)  iohexol (OMNIPAQUE) 350 MG/ML injection 80 mL (80 mLs Intravenous Contrast Given 12/13/21 1404)     IMPRESSION / MDM / ASSESSMENT AND PLAN / ED COURSE  I reviewed the triage vital signs and the nursing notes.  88 year old male with PMH as noted above including CAD status post MI and catheterization in 1999, aortic aneurysm, DM, CKD, hypertension, and BPH presents with chest pain since this morning.  I reviewed the past medical records; the patient was most recently seen by his cardiologist Dr. Ubaldo Glassing on 11/01/2021.  At that time he complained of intermittent dizziness and ankle swelling.  Plan was made to discontinue Cozaar, but continue his other medications and continue medical management of his CAD.  Differential diagnosis includes, but is not limited to, ACS, aortic dissection or other acute vascular etiology, GERD, musculoskeletal pain, or other benign cause.  Chest x-ray shows no acute abnormality.  EKG is nonischemic.  Initial troponin was slightly elevated; the repeat has gone up.  This increases the likelihood that the patient could be having ACS.  BMP and CBC are unremarkable.  We will obtain a CT angio to rule out aortic dissection.  If this is negative I anticipate admission given the rising troponin.  The patient is on the cardiac monitor to evaluate for evidence of arrhythmia and/or significant heart rate changes.  ----------------------------------------- 2:49 PM on 12/13/2021 -----------------------------------------  CT angio shows no acute abnormality.  I consulted Dr. Clayborn Bigness who advises that if the patient has active pain, he would agree with admission although does not recommend any acute intervention based on the rising troponin.  I consulted Dr. Si Raider from  the hospitalist service; based on our discussion he agrees to admit the patient.   FINAL CLINICAL IMPRESSION(S) / ED DIAGNOSES   Final diagnoses:  Atypical chest pain  Elevated troponin     Rx / DC Orders   ED Discharge Orders     None        Note:  This document was prepared using Dragon voice recognition software and may include unintentional dictation errors.    Arta Silence, MD 12/13/21 1450

## 2021-12-13 NOTE — ED Notes (Signed)
Back from CT. C/o worsening in his burning CP, now 6/10, up from 2/10.

## 2021-12-13 NOTE — H&P (Addendum)
History and Physical    Trevor Deleon. BTD:[id number] DOB: 1932/08/27 DOA: 12/13/2021  PCP: Latanya Maudlin, NP  Patient coming from: home   Chief Complaint: chest pain  HPI: Trevor Worley. is a 88 y.o. male with medical history significant for CAD, ckd3b, htn, dm, pancytopenia, who presents with the above.  Has had intermittent substernal mild chest pain last several months. Not exertional, doesn't radiate. Woke up with 2/10 pain today. Not pleuritic. No n/v/d. Has appetite. No fevers. No cough. Not exertional. Called EMS and they instructed him to take 4 baby aspirins which he did.  ED Course:   Labs, cardiology consult  Review of Systems: As per HPI otherwise 10 point review of systems negative.    Past Medical History:  Diagnosis Date   Arthritis    BPH (benign prostatic hyperplasia)    CKD (chronic kidney disease) stage 3, GFR 30-59 ml/min (HCC)    Coronary artery disease    Diabetes mellitus without complication (HCC)    Pt does not take medications.   Duodenum disorder    BRUNNERS GLAND   ED (erectile dysfunction)    GERD (gastroesophageal reflux disease)    Hernia, femoral    History of hiatal hernia    Hyperlipidemia    Hypertension    Hypogonadism in male    Labyrinthitis    Myocardial infarction ([place])    1999   Pancytopenia ([place])    Stricture esophagus     Past Surgical History:  Procedure Laterality Date   APPENDECTOMY     CARDIAC CATHETERIZATION     CARDIAC SURGERY     CHOLECYSTECTOMY N/A 09/02/2015   Procedure: LAPAROSCOPIC CHOLECYSTECTOMY;  Surgeon: Leonie Green, MD;  Location: [facility] ORS;  Service: General;  Laterality: N/A;   COLONOSCOPY     CORONARY ANGIOPLASTY     ERCP Left 09/03/2015   Procedure: ENDOSCOPIC RETROGRADE CHOLANGIOPANCREATOGRAPHY (ERCP);  Surgeon: Hulen Luster, MD;  Location: [facility] ENDOSCOPY;  Service: Endoscopy;  Laterality: Left;   ESOPHAGOGASTRODUODENOSCOPY (EGD) WITH PROPOFOL N/A 08/29/2017   Procedure:  ESOPHAGOGASTRODUODENOSCOPY (EGD) WITH PROPOFOL;  Surgeon: Toledo, Benay Pike, MD;  Location: [facility] ENDOSCOPY;  Service: Gastroenterology;  Laterality: N/A;   FLEXIBLE SIGMOIDOSCOPY     HERNIA REPAIR     INGUINAL HERNIA REPAIR Left 12/15/2015   Procedure: HERNIA REPAIR INGUINAL ADULT;  Surgeon: Leonie Green, MD;  Location: [facility] ORS;  Service: General;  Laterality: Left;   INTRAOPERATIVE CHOLANGIOGRAM  09/02/2015   Procedure: INTRAOPERATIVE CHOLANGIOGRAM;  Surgeon: Leonie Green, MD;  Location: [facility] ORS;  Service: General;;   JOINT REPLACEMENT Left    LIPOMA EXCISION     PROSTATE SURGERY  12/17/2014   TONSILLECTOMY       reports that he has quit smoking. His smoking use included cigarettes. He has a 15.00 pack-year smoking history. He has never used smokeless tobacco. He reports that he does not drink alcohol and does not use drugs.  No Known Allergies  Family History  Problem Relation Age of Onset   Hypertension Mother    Stroke Mother    Prostate cancer Father    Heart attack Father     Prior to Admission medications   Medication Sig Start Date End Date Taking? Authorizing Provider  acetaminophen (TYLENOL) 650 MG CR tablet Take 650 mg by mouth every 6 (six) hours as needed for pain.    [provider]  ALPRAZolam Duanne Moron) 0.25 MG tablet Take 1 tablet by mouth at bedtime as needed  for sleep.  06/25/15   [provider]  aspirin EC 81 MG tablet Take 81 mg by mouth daily.    [provider]  docusate sodium (COLACE) 100 MG capsule Take 100 mg by mouth 2 (two) times daily.    [provider]  losartan (COZAAR) 25 MG tablet Take 25 mg by mouth daily. 09/22/21   [provider]  nitroGLYCERIN (NITROSTAT) 0.4 MG SL tablet Place 0.4 mg under the tongue. UP TO 3 DOSES    [provider]  nystatin-triamcinolone ointment (MYCOLOG) Apply 1 application topically 2 (two) times daily. 10/25/21   Zara Council A, PA-C  Omega-3 Fatty  Acids (FISH OIL) 1000 MG CAPS Take 3,000 mg by mouth 2 (two) times daily.    [provider]  pantoprazole (PROTONIX) 40 MG tablet Take 40 mg by mouth daily.    [provider]  Polyethylene Glycol 3350 (MIRALAX PO) Take 17 g by mouth daily.     [provider]  Psyllium (METAMUCIL PO) Take 1 Dose by mouth daily. Reported on 12/15/2015    [provider]  simvastatin (ZOCOR) 40 MG tablet Take 40 mg by mouth at bedtime.     [provider]  tamsulosin (FLOMAX) 0.4 MG CAPS capsule Take 0.4 mg by mouth at bedtime. 09/22/21   [provider]    Physical Exam: Vitals:   12/13/21 1122 12/13/21 1230  BP: 119/70 (!) 155/86  Pulse: 93 73  Resp: 18 16  Temp: 98.2 F (36.8 C)   TempSrc: Oral   SpO2: 98% 99%  Weight: 81 kg   Height: 5\' 11"  (1.803 m)     Constitutional: No acute distress Head: Atraumatic Eyes: Conjunctiva clear ENM: Moist mucous membranes. Normal dentition.  Neck: Supple Respiratory: Clear to auscultation bilaterally, no wheezing/rales/rhonchi. Normal respiratory effort. No accessory muscle use. . Cardiovascular: Regular rate and rhythm. No murmurs/rubs/gallops. Abdomen: Non-tender, non-distended. No masses. No rebound or guarding. Positive bowel sounds. Musculoskeletal: No joint deformity upper and lower extremities. Normal ROM, no contractures. Normal muscle tone.  Skin: No rashes, lesions, or ulcers.  Extremities: No peripheral edema. Palpable peripheral pulses. Neurologic: Alert, moving all 4 extremities. Psychiatric: Normal insight and judgement.   Labs on Admission: I have personally reviewed following labs and imaging studies  CBC: Recent Labs  Lab 12/13/21 1124  WBC 4.9  HGB 14.7  HCT 41.8  MCV 85.5  PLT 90*   Basic Metabolic Panel: Recent Labs  Lab 12/13/21 1124  NA 137  K 3.7  CL 100  CO2 27  GLUCOSE 141*  BUN 27*  CREATININE 1.56*  CALCIUM 9.3   GFR: Estimated Creatinine Clearance: 35.5  mL/min (A) (by C-G formula based on SCr of 1.56 mg/dL (H)). Liver Function Tests: No results for input(s): AST, ALT, ALKPHOS, BILITOT, PROT, ALBUMIN in the last 168 hours. No results for input(s): LIPASE, AMYLASE in the last 168 hours. No results for input(s): AMMONIA in the last 168 hours. Coagulation Profile: No results for input(s): INR, PROTIME in the last 168 hours. Cardiac Enzymes: No results for input(s): CKTOTAL, CKMB, CKMBINDEX, TROPONINI in the last 168 hours. BNP (last 3 results) No results for input(s): PROBNP in the last 8760 hours. HbA1C: No results for input(s): HGBA1C in the last 72 hours. CBG: No results for input(s): GLUCAP in the last 168 hours. Lipid Profile: No results for input(s): CHOL, HDL, LDLCALC, TRIG, CHOLHDL, LDLDIRECT in the last 72 hours. Thyroid Function Tests: No results for input(s): TSH, T4TOTAL, FREET4,  T3FREE, THYROIDAB in the last 72 hours. Anemia Panel: No results for input(s): VITAMINB12, FOLATE, FERRITIN, TIBC, IRON, RETICCTPCT in the last 72 hours. Urine analysis:    Component Value Date/Time   COLORURINE AMBER (A) 12/13/2021 1140   APPEARANCEUR HAZY (A) 12/13/2021 1140   LABSPEC 1.030 12/13/2021 1140   PHURINE 5.0 12/13/2021 1140   GLUCOSEU NEGATIVE 12/13/2021 1140   HGBUR NEGATIVE 12/13/2021 1140   BILIRUBINUR NEGATIVE 12/13/2021 1140   KETONESUR 20 (A) 12/13/2021 1140   PROTEINUR 30 (A) 12/13/2021 1140   NITRITE NEGATIVE 12/13/2021 1140   LEUKOCYTESUR NEGATIVE 12/13/2021 1140    Radiological Exams on Admission: DG Chest 2 View  Result Date: 12/13/2021 CLINICAL DATA:  Chest pain EXAM: CHEST - 2 VIEW COMPARISON:  08/29/2015 FINDINGS: Heart size within normal limits. Aortic atherosclerosis. No focal airspace consolidation, pleural effusion, or pneumothorax. Chronic mild compression deformity of T11. IMPRESSION: No active cardiopulmonary disease. Electronically Signed   By: Davina Poke D.O.   On: 12/13/2021 11:56   CT Angio  Chest/Abd/Pel for Dissection W and/or Wo Contrast  Result Date: 12/13/2021 CLINICAL DATA:  Acute aortic syndrome.  Chest pain beginning today. EXAM: CT ANGIOGRAPHY CHEST, ABDOMEN AND PELVIS TECHNIQUE: Multidetector CT imaging through the chest, abdomen and pelvis was performed using the standard protocol during bolus administration of intravenous contrast. Multiplanar reconstructed images and MIPs were obtained and reviewed to evaluate the vascular anatomy. RADIATION DOSE REDUCTION: This exam was performed according to the departmental dose-optimization program which includes automated exposure control, adjustment of the mA and/or kV according to patient size and/or use of iterative reconstruction technique. CONTRAST:  19mL OMNIPAQUE IOHEXOL 350 MG/ML SOLN COMPARISON:  Chest radiography same day. MRI 08/24/2021. CT 03/10/2021. CT 04/23/2019. FINDINGS: CTA CHEST FINDINGS Cardiovascular: The heart is at the upper limits of normal in size. Coronary artery calcification is present. Aortic atherosclerotic calcification is present. The aortic is tortuous. Maximal diameter of the ascending aorta is measured at 3.8 cm. Aortic branching pattern is normal without evidence of brachiocephalic vessel origin stenosis. No evidence of aortic dissection or acute ulceration. Mediastinum/Nodes: No mass or adenopathy. Lungs/Pleura: No pleural effusion. No pulmonary infiltrate, collapse, mass or worrisome nodule. 4 mm subsolid nodule in the lateral right middle lobe is unchanged since June of 2020 and benign. Musculoskeletal: Old superior endplate fracture at Z61 and minimal superior endplate deformity at W96 are unchanged. Review of the MIP images confirms the above findings. CTA ABDOMEN AND PELVIS FINDINGS VASCULAR Aorta: Aortic atherosclerosis. No aneurysm. No dissection. Maximal diameter 2.4 cm. Celiac: Atherosclerosis at the origin but without stenosis. SMA: Atherosclerosis at the origin.  30% stenosis. Renals: Atherosclerosis at  both renal artery origins. 40% stenosis on the right. No measurable stenosis on the left. IMA: Patent.  50% stenosis at the origin. Inflow: Normal Veins: Normal Review of the MIP images confirms the above findings. NON-VASCULAR Hepatobiliary: Liver parenchyma is normal. Previous cholecystectomy. Pancreas: Normal Spleen: Normal Adrenals/Urinary Tract: Adrenal glands are normal. No hydronephrosis of the kidneys. Simple appearing cysts on the right. Some renal atrophic changes, right more than left. Bladder is normal. Stomach/Bowel: Stomach and small intestine are unremarkable. Diverticulosis of the left colon without evidence of diverticulitis Lymphatic: No lymphadenopathy. Reproductive: Enlarged prostate. Other: Left inguinal hernia containing only fat. Musculoskeletal: Chronic lumbar degenerative changes. Review of the MIP images confirms the above findings. IMPRESSION: No acute vascular finding. Coronary artery calcification. Aortic atherosclerotic calcification. Maximal diameter of the ascending aorta measured at 3.8 cm. Aortic atherosclerosis without evidence of dissection or  penetrating ulcer. Abdominal aortic atherosclerosis. Maximal diameter of the infrarenal abdominal aorta is only 2.4 cm. Atherosclerosis of the aortic branch vessels, most notably 40% stenosis of the right renal artery and 50% stenosis of the inferior mesenteric artery. No acute lung finding. Stable 4 mm nodule in the right middle lobe, as distant as 04/23/2019, therefore benign. No acute abdominal organ pathology visible. Enlargement of the prostate gland. Some atrophic changes of the kidneys. Electronically Signed   By: Nelson Chimes M.D.   On: 12/13/2021 14:27    EKG: Independently reviewed. Nsr, no ischemic changes  Assessment/Plan Principal Problem:   Chest pain Active Problems:   Benign prostatic hyperplasia with nocturia   Chronic renal insufficiency, stage III (moderate) (HCC)   Pancytopenia (HCC)   Type 2 diabetes mellitus  with stage 3 chronic kidney disease, without long-term current use of insulin ([place])    # Chest pain, concern for nstemi # CAD Atypical chest pain but does have multiple risk factors, most recent nuclear stress in 2021 with mild non-reversible defect. CTA w/o signs acute process. EKG non-ischemic. GERD in ddx. Trops did rise 32>54>87. Cardiology consulted by EDP, dr. Clayborn Bigness advised admission for monitoring - continue to trend trop, given persistent up-trend will start heparin gtt - has received asa, will cont home asa/statin - f/u TTE - tele, AM EKG  # Thrombocytopenia Chronic. Unclear if previously worked up - f/u hiv, hcv, smear - likely further outpt w/u  # T2DM Diet controlled, here glucose wnl - monitor glucose daily  # CKD 3b Stable - monitor  # BPH - home flomax  # GAD - home alpraz prn  # HTN - home doxazosin, losartan  DVT prophylaxis: heparin therapeutic Code Status: dnr Family Communication: daughter who is hcpoa updated @ bedside  Consults called: cardiology   Level of care: Telemetry Medical Status is: Observation  The patient remains OBS appropriate and will d/c before 2 midnights.       Desma Maxim MD [facility] Pager [phone]  If 7PM-7AM, please contact night-coverage [email or web address] Password [facility]  12/13/2021, 3:27 PM

## 2021-12-13 NOTE — ED Notes (Signed)
Denies tremor at baseline. BUE tremulous/ shaky. Effecting BPs.

## 2021-12-13 NOTE — ED Triage Notes (Signed)
See first nurse note. Pt reports chest pain across chest with shob. Reports pain worsened after nitro.  Alert and oriented.

## 2021-12-13 NOTE — ED Notes (Addendum)
To hall stretcher from triage, here for CP. Placed on monitor. EKG reviewed. First trop elevated. Alert, NAD, calm, interactive, resps e/u, reports chest tightness.

## 2021-12-13 NOTE — Plan of Care (Signed)
Lab contacted to inform Trop continues to climb.  This RN informed patients RN and she also informed Dr.

## 2021-12-13 NOTE — Consult Note (Addendum)
ANTICOAGULATION CONSULT NOTE - Initial Consult  Pharmacy Consult for Heparin Indication: chest pain/ACS  No Known Allergies  Patient Measurements: Height: 5\' 11"  (180.3 cm) Weight: 81 kg (178 lb 9.2 oz) IBW/kg (Calculated) : 75.3 Heparin Dosing Weight: 81 kg  Vital Signs: Temp: 98.2 F (36.8 C) (01/23 1122) Temp Source: Oral (01/23 1122) BP: 155/86 (01/23 1230) Pulse Rate: 73 (01/23 1230)  Labs: Recent Labs    12/13/21 1124 12/13/21 1247 12/13/21 1640  HGB 14.7  --  14.2  HCT 41.8  --  38.8*  PLT 90*  --  98*  CREATININE 1.56*  --  1.42*  TROPONINIHS 32* 54* 87*    Estimated Creatinine Clearance: 39 mL/min (A) (by C-G formula based on SCr of 1.42 mg/dL (H)).   Medical History: Past Medical History:  Diagnosis Date   Arthritis    BPH (benign prostatic hyperplasia)    CKD (chronic kidney disease) stage 3, GFR 30-59 ml/min (HCC)    Coronary artery disease    Diabetes mellitus without complication ([place])    Pt does not take medications.   Duodenum disorder    BRUNNERS GLAND   ED (erectile dysfunction)    GERD (gastroesophageal reflux disease)    Hernia, femoral    History of hiatal hernia    Hyperlipidemia    Hypertension    Hypogonadism in male    Labyrinthitis    Myocardial infarction ([place])    1999   Pancytopenia ([place])    Stricture esophagus     Medications:  No history of chronic anticoagulant use.  Assessment: Pharmacy has been consulted to initiate heparin in 89yo patient presenting to the ED with chest pain. Troponin levels of 32, 54, and 87 respectively. Baseline labs have been ordered and pending.  Goal of Therapy:  Heparin level 0.3-0.7 units/ml Monitor platelets by anticoagulation protocol: Yes   Plan:  Patient received enoxaparin 40mg  x 1 dose.  Will avoid bolus and start heparin infusion at 1000 units/hr Check anti-Xa level in 8 hours and daily while on heparin Continue to monitor H&H and platelets  Estrella Alcaraz A Revonda Menter 12/13/2021,7:01  PM

## 2021-12-13 NOTE — ED Notes (Signed)
Family at [facility]. PT remains alert, NAD, calm, interactive, less shaky. Successful with urinal. Mentions BPH.

## 2021-12-14 ENCOUNTER — Encounter: Payer: Self-pay | Admitting: Obstetrics and Gynecology

## 2021-12-14 ENCOUNTER — Observation Stay
Admit: 2021-12-14 | Discharge: 2021-12-14 | Disposition: A | Discharge status: Home / Self Care | Payer: PPO | Attending: Obstetrics and Gynecology | Admitting: Obstetrics and Gynecology

## 2021-12-14 DIAGNOSIS — R079 Chest pain, unspecified: Secondary | ICD-10-CM | POA: Diagnosis not present

## 2021-12-14 LAB — HEPARIN LEVEL (UNFRACTIONATED)
Heparin Unfractionated: >1.1 IU/mL — ABNORMAL HIGH (ref 0.30–0.70)
Heparin Unfractionated: 0.66 IU/mL (ref 0.30–0.70)
Heparin Unfractionated: 0.65 IU/mL (ref 0.30–0.70)

## 2021-12-14 LAB — CBC
HCT: 37.7 % — ABNORMAL LOW (ref 39.0–52.0)
Hemoglobin: 13.7 g/dL (ref 13.0–17.0)
MCH: 30.5 pg (ref 26.0–34.0)
MCHC: 36.3 g/dL — ABNORMAL HIGH (ref 30.0–36.0)
MCV: 84 fL (ref 80.0–100.0)
Platelets: 98 10*3/uL — ABNORMAL LOW (ref 150–400)
RBC: 4.49 MIL/uL (ref 4.22–5.81)
RDW: 12.4 % (ref 11.5–15.5)
WBC: 5.1 10*3/uL (ref 4.0–10.5)
nRBC: 0 % (ref 0.0–0.2)

## 2021-12-14 LAB — ECHOCARDIOGRAM COMPLETE
AR max vel: 3.23 cm2
AV Area VTI: 3.68 cm2
AV Area mean vel: 2.99 cm2
AV Mean grad: 2 mmHg
AV Peak grad: 3.4 mmHg
Ao pk vel: 0.92 m/s
Area-P 1/2: 4.57 cm2
Height: 71 in
MV VTI: 3.36 cm2
S' Lateral: 2.4 cm
Weight: 2857.16 oz

## 2021-12-14 LAB — BASIC METABOLIC PANEL
Anion gap: 8 (ref 5–15)
BUN: 27 mg/dL — ABNORMAL HIGH (ref 8–23)
CO2: 25 mmol/L (ref 22–32)
Calcium: 9.2 mg/dL (ref 8.9–10.3)
Chloride: 104 mmol/L (ref 98–111)
Creatinine, Ser: 1.41 mg/dL — ABNORMAL HIGH (ref 0.61–1.24)
GFR, Estimated: 48 mL/min — ABNORMAL LOW (ref >60)
Glucose, Bld: 113 mg/dL — ABNORMAL HIGH (ref 70–99)
Potassium: 3.5 mmol/L (ref 3.5–5.1)
Sodium: 137 mmol/L (ref 135–145)

## 2021-12-14 LAB — TROPONIN I (HIGH SENSITIVITY)
Troponin I (High Sensitivity): 66 ng/L — ABNORMAL HIGH (ref <18)
Troponin I (High Sensitivity): 50 ng/L — ABNORMAL HIGH (ref <18)

## 2021-12-14 LAB — HEPATITIS C ANTIBODY: HCV Ab: NONREACTIVE

## 2021-12-14 LAB — HIV ANTIBODY (ROUTINE TESTING W REFLEX): HIV Screen 4th Generation wRfx: NONREACTIVE

## 2021-12-14 MED ORDER — SODIUM CHLORIDE 0.9 % IV SOLN: 250.0000 mL | INTRAVENOUS | Status: DC | PRN

## 2021-12-14 MED ORDER — SODIUM CHLORIDE 0.9% FLUSH
3.0000 mL | Freq: Two times a day (BID) | INTRAVENOUS | Status: DC
  Administered 2021-12-14: 21:00:00 3 mL via INTRAVENOUS

## 2021-12-14 MED ORDER — ASPIRIN 81 MG PO CHEW
81.0000 mg | CHEWABLE_TABLET | ORAL | Status: DC
  Filled 2021-12-14: qty 1

## 2021-12-14 MED ORDER — SODIUM CHLORIDE 0.9 % WEIGHT BASED INFUSION
3.0000 mL/kg/h | INTRAVENOUS | Status: DC
  Administered 2021-12-15: 04:00:00 3 mL/kg/h via INTRAVENOUS

## 2021-12-14 MED ORDER — SODIUM CHLORIDE 0.9 % WEIGHT BASED INFUSION: 3.0000 mL/kg/h | INTRAVENOUS | Status: DC

## 2021-12-14 MED ORDER — SODIUM CHLORIDE 0.9% FLUSH
3.0000 mL | Freq: Two times a day (BID) | INTRAVENOUS | Status: DC
  Administered 2021-12-14 – 2021-12-15 (×2): 3 mL via INTRAVENOUS

## 2021-12-14 MED ORDER — ASPIRIN 81 MG PO CHEW
81.0000 mg | CHEWABLE_TABLET | ORAL | Status: AC
  Administered 2021-12-15: 05:00:00 81 mg via ORAL

## 2021-12-14 MED ORDER — SODIUM CHLORIDE 0.9% FLUSH: 3.0000 mL | INTRAVENOUS | Status: DC | PRN

## 2021-12-14 MED ORDER — SODIUM CHLORIDE 0.9 % WEIGHT BASED INFUSION: 1.0000 mL/kg/h | INTRAVENOUS | Status: DC

## 2021-12-14 MED ORDER — HEPARIN (PORCINE) 25000 UT/250ML-% IV SOLN
800.0000 [IU]/h | INTRAVENOUS | Status: DC
  Administered 2021-12-14 – 2021-12-15 (×2): 800 [IU]/h via INTRAVENOUS
  Filled 2021-12-14: qty 250

## 2021-12-14 MED ORDER — SODIUM CHLORIDE 0.9 % WEIGHT BASED INFUSION
1.0000 mL/kg/h | INTRAVENOUS | Status: DC
  Administered 2021-12-15: 05:00:00 1 mL/kg/h via INTRAVENOUS

## 2021-12-14 MED ORDER — DOXAZOSIN MESYLATE 4 MG PO TABS
8.0000 mg | ORAL_TABLET | Freq: Every day | ORAL | Status: DC
  Administered 2021-12-14 – 2021-12-15 (×3): 8 mg via ORAL
  Filled 2021-12-14 (×4): qty 2

## 2021-12-14 NOTE — Consult Note (Signed)
ANTICOAGULATION CONSULT NOTE - Initial Consult  Pharmacy Consult for Heparin Indication: chest pain/ACS  No Known Allergies  Patient Measurements: Height: 5\' 11"  (180.3 cm) Weight: 81 kg (178 lb 9.2 oz) IBW/kg (Calculated) : 75.3 Heparin Dosing Weight: 81 kg  Vital Signs: BP: 175/74 (01/24 0300) Pulse Rate: 58 (01/24 0300)  Labs: Recent Labs    12/13/21 1124 12/13/21 1247 12/13/21 1640 12/13/21 1905 12/14/21 0027 12/14/21 0221  HGB 14.7  --  14.2  --   --  13.7  HCT 41.8  --  38.8*  --   --  37.7*  PLT 90*  --  98*  --   --  98*  APTT  --   --   --  39*  --   --   LABPROT  --   --   --  13.8  --   --   INR  --   --   --  1.1  --   --   HEPARINUNFRC  --   --   --   --   --  >1.10*  CREATININE 1.56*  --  1.42*  --   --  1.41*  TROPONINIHS 32* 54* 87*  --  66*  --      Estimated Creatinine Clearance: 39.3 mL/min (A) (by C-G formula based on SCr of 1.41 mg/dL (H)).   Medical History: Past Medical History:  Diagnosis Date   Arthritis    BPH (benign prostatic hyperplasia)    CKD (chronic kidney disease) stage 3, GFR 30-59 ml/min (HCC)    Coronary artery disease    Diabetes mellitus without complication ([place])    Pt does not take medications.   Duodenum disorder    BRUNNERS GLAND   ED (erectile dysfunction)    GERD (gastroesophageal reflux disease)    Hernia, femoral    History of hiatal hernia    Hyperlipidemia    Hypertension    Hypogonadism in male    Labyrinthitis    Myocardial infarction ([place])    1999   Pancytopenia ([place])    Stricture esophagus     Medications:  No history of chronic anticoagulant use.  Assessment: Pharmacy has been consulted to initiate heparin in 89yo patient presenting to the ED with chest pain. Troponin levels of 32, 54, and 87 respectively. Baseline labs have been ordered and pending.  Goal of Therapy:  Heparin level 0.3-0.7 units/ml Monitor platelets by anticoagulation protocol: Yes  1/24 0221 HL >1.1, supratherapeutic    Plan:  Careers information officer.   HL drawn by lab tech.   Hold infusion for 1 hr. Will restart heparin infusion at 800 units/hr Recheck HL in 8 hr after restart Continue to monitor H&H and platelets  Renda Rolls, PharmD, [facility] 12/14/2021 3:57 AM

## 2021-12-14 NOTE — Progress Notes (Signed)
*  PRELIMINARY RESULTS* Echocardiogram 2D Echocardiogram has been performed.  Trevor Deleon 12/14/2021, 10:55 AM

## 2021-12-14 NOTE — Consult Note (Signed)
ANTICOAGULATION CONSULT NOTE   Pharmacy Consult for Heparin Indication: chest pain/ACS  No Known Allergies  Patient Measurements: Height: 5\' 11"  (180.3 cm) Weight: 81 kg (178 lb 9.2 oz) IBW/kg (Calculated) : 75.3 Heparin Dosing Weight: 81 kg  Vital Signs: Temp: 98.3 F (36.8 C) (01/24 2038) Temp Source: Oral (01/24 2038) BP: 156/75 (01/24 2038) Pulse Rate: 62 (01/24 2038)  Labs: Recent Labs    12/13/21 1124 12/13/21 1247 12/13/21 1640 12/13/21 1905 12/14/21 0027 12/14/21 0221 12/14/21 0637 12/14/21 1418 12/14/21 2205  HGB 14.7  --  14.2  --   --  13.7  --   --   --   HCT 41.8  --  38.8*  --   --  37.7*  --   --   --   PLT 90*  --  98*  --   --  98*  --   --   --   APTT  --   --   --  39*  --   --   --   --   --   LABPROT  --   --   --  13.8  --   --   --   --   --   INR  --   --   --  1.1  --   --   --   --   --   HEPARINUNFRC  --   --   --   --   --  >1.10*  --  0.66 0.65  CREATININE 1.56*  --  1.42*  --   --  1.41*  --   --   --   TROPONINIHS 32*   < > 87*  --  66*  --  50*  --   --    < > = values in this interval not displayed.     Estimated Creatinine Clearance: 39.3 mL/min (A) (by C-G formula based on SCr of 1.41 mg/dL (H)).   Medical History: Past Medical History:  Diagnosis Date   Arthritis    BPH (benign prostatic hyperplasia)    CKD (chronic kidney disease) stage 3, GFR 30-59 ml/min (HCC)    Coronary artery disease    Diabetes mellitus without complication ([place])    Pt does not take medications.   Duodenum disorder    BRUNNERS GLAND   ED (erectile dysfunction)    GERD (gastroesophageal reflux disease)    Hernia, femoral    History of hiatal hernia    Hyperlipidemia    Hypertension    Hypogonadism in male    Labyrinthitis    Myocardial infarction ([place])    1999   Pancytopenia ([place])    Stricture esophagus     Medications:  No history of chronic anticoagulant use.  Assessment: Pharmacy has been consulted to initiate heparin in 88yo  patient presenting to the ED with chest pain. Troponin levels of 32, 54, and 87 respectively. Baseline labs have been ordered and pending.  Goal of Therapy:  Heparin level 0.3-0.7 units/ml Monitor platelets by anticoagulation protocol: Yes  1/24 0221 HL >1.1, supratherapeutic 1/24 1418 HL 0.66.  1/24 2205 HL 0.65, therapeutic x 2   Plan:  Heparin level remains therapeutic. Will continue heparin infusion at 800 units/hr. Recheck Heparin level daily with AM labs while therapeutic on heparin. CBC daily while on heparin .   Renda Rolls, PharmD, [facility] 12/14/2021 11:13 PM

## 2021-12-14 NOTE — Consult Note (Signed)
ANTICOAGULATION CONSULT NOTE   Pharmacy Consult for Heparin Indication: chest pain/ACS  No Known Allergies  Patient Measurements: Height: 5\' 11"  (180.3 cm) Weight: 81 kg (178 lb 9.2 oz) IBW/kg (Calculated) : 75.3 Heparin Dosing Weight: 81 kg  Vital Signs: Temp: 97.7 F (36.5 C) (01/24 1338) Temp Source: Oral (01/24 1200) BP: 153/76 (01/24 1338) Pulse Rate: 77 (01/24 1338)  Labs: Recent Labs    12/13/21 1124 12/13/21 1247 12/13/21 1640 12/13/21 1905 12/14/21 0027 12/14/21 0221 12/14/21 0637 12/14/21 1418  HGB 14.7  --  14.2  --   --  13.7  --   --   HCT 41.8  --  38.8*  --   --  37.7*  --   --   PLT 90*  --  98*  --   --  98*  --   --   APTT  --   --   --  39*  --   --   --   --   LABPROT  --   --   --  13.8  --   --   --   --   INR  --   --   --  1.1  --   --   --   --   HEPARINUNFRC  --   --   --   --   --  >1.10*  --  0.66  CREATININE 1.56*  --  1.42*  --   --  1.41*  --   --   TROPONINIHS 32*   < > 87*  --  66*  --  50*  --    < > = values in this interval not displayed.     Estimated Creatinine Clearance: 39.3 mL/min (A) (by C-G formula based on SCr of 1.41 mg/dL (H)).   Medical History: Past Medical History:  Diagnosis Date   Arthritis    BPH (benign prostatic hyperplasia)    CKD (chronic kidney disease) stage 3, GFR 30-59 ml/min (HCC)    Coronary artery disease    Diabetes mellitus without complication ([place])    Pt does not take medications.   Duodenum disorder    BRUNNERS GLAND   ED (erectile dysfunction)    GERD (gastroesophageal reflux disease)    Hernia, femoral    History of hiatal hernia    Hyperlipidemia    Hypertension    Hypogonadism in male    Labyrinthitis    Myocardial infarction ([place])    1999   Pancytopenia ([place])    Stricture esophagus     Medications:  No history of chronic anticoagulant use.  Assessment: Pharmacy has been consulted to initiate heparin in 88yo patient presenting to the ED with chest pain. Troponin levels  of 32, 54, and 87 respectively. Baseline labs have been ordered and pending.  Goal of Therapy:  Heparin level 0.3-0.7 units/ml Monitor platelets by anticoagulation protocol: Yes  1/24 0221 HL >1.1, supratherapeutic 1/24 1418 HL 0.66.    Plan:  Heparin level is therapeutic. Will continue heparin infusion at 800 units/hr. Recheck Heparin level in 8 hours. CBC daily while on heparin .   Eleonore Chiquito, PharmD, 12/14/2021 2:57 PM

## 2021-12-14 NOTE — TOC Progression Note (Signed)
Transition of Care (TOC) - Progression Note    Patient Details  Name: Trevor Deleon. MRN: [id number] Date of Birth: 1932-05-08  Transition of Care [facility]) CM/SW Contact  Shelbie Hutching, RN Phone Number: 12/14/2021, 12:34 PM  Clinical Narrative:    From home with wife and daughter and daughter's husband.  Independent.  Daughter provides transportation.     Transition of Care [facility]) Screening Note   Patient Details  Name: Trevor Deleon. Date of Birth: 04-05-1932   Transition of Care [facility]) CM/SW Contact:    Shelbie Hutching, RN Phone Number: 12/14/2021, 12:34 PM    Transition of Care Department [facility]) has reviewed patient and no TOC needs have been identified at this time. We will continue to monitor patient advancement through interdisciplinary progression rounds. If new patient transition needs arise, please place a TOC consult.          Expected Discharge Plan and Services                                                 Social Determinants of Health (SDOH) Interventions    Readmission Risk Interventions No flowsheet data found.

## 2021-12-14 NOTE — Progress Notes (Signed)
PROGRESS NOTE    Trevor Deleon.  KGM:[id number] DOB: Jan 07, 1932 DOA: 12/13/2021 PCP: Latanya Maudlin, NP  [phone]   Assessment & Plan:   Principal Problem:   Chest pain Active Problems:   Benign prostatic hyperplasia with nocturia   Chronic renal insufficiency, stage III (moderate) (HCC)   Pancytopenia (HCC)   Type 2 diabetes mellitus with stage 3 chronic kidney disease, without long-term current use of insulin (HCC)   Trevor Deleon. is a 88 y.o. male with medical history significant for CAD, ckd, htn, dm, pancytopenia, who presents with the above.   # Chest pain Atypical chest pain but does have multiple risk factors, most recent nuclear stress in 2021 with mild non-reversible defect. CTA w/o signs acute process. EKG non-ischemic. GERD in ddx. Trops did rise 32>54>87. Cardiology consulted by EDP, dr. Clayborn Bigness advised admission for monitoring --started on heparin gtt on admission Plan: --cont heparin gtt for 48 hours, per cardio --plan for heart cath tomorrow   # CAD s/p MI 1999 with DES to RCA and residual disease --cardio plan for ischemic eval  --cont ASA and statin  # Thrombocytopenia, chronic --plt 90's, which has been stable since 2016. --outpatient f/u   # T2DM Diet controlled, here glucose wnl --BG check with morning labs   # CKD 3a Stable   # BPH - not currently taking flomax   # GAD - home alpraz prn   # HTN - cont home doxazosin and losartan   DVT prophylaxis: ZD:GLOVFIE gtt Code Status: DNR per admitting provider Family Communication:  Level of care: Telemetry Medical Dispo:   The patient is from: home Anticipated d/c is to: home Anticipated d/c date is: 1-2 days Patient currently is not medically ready to d/c due to: pending heart cath   Subjective and Interval History:  Pt reported chest pain resolved.  Pt localized chest pain to be in the epigastric region.   Objective: Vitals:   12/14/21 1338 12/14/21 1617  12/14/21 2038 12/14/21 2346  BP: (!) 153/76 138/73 (!) 156/75 (!) 147/80  Pulse: 77 62 62 65  Resp: 20 18 20    Temp: 97.7 F (36.5 C) 98.2 F (36.8 C) 98.3 F (36.8 C) 97.7 F (36.5 C)  TempSrc:  Oral Oral Oral  SpO2: 100% 99% 95% 97%  Weight:      Height:        Intake/Output Summary (Last 24 hours) at 12/15/2021 0314 Last data filed at 12/14/2021 2107 Gross per 24 hour  Intake 116.14 ml  Output 50 ml  Net 66.14 ml   Filed Weights   12/13/21 1122  Weight: 81 kg    Examination:   Constitutional: NAD, AAOx3 HEENT: conjunctivae and lids normal, EOMI CV: No cyanosis.   RESP: normal respiratory effort, on RA Extremities: No effusions, edema in BLE SKIN: warm, dry Neuro: II - XII grossly intact.   Psych: Normal mood and affect.  Appropriate judgement and reason   Data Reviewed: I have personally reviewed following labs and imaging studies  CBC: Recent Labs  Lab 12/13/21 1124 12/13/21 1640 12/14/21 0221  WBC 4.9 4.7 5.1  HGB 14.7 14.2 13.7  HCT 41.8 38.8* 37.7*  MCV 85.5 84.5 84.0  PLT 90* 98* 98*   Basic Metabolic Panel: Recent Labs  Lab 12/13/21 1124 12/13/21 1640 12/14/21 0221  NA 137  --  137  K 3.7  --  3.5  CL 100  --  104  CO2 27  --  25  GLUCOSE 141*  --  113*  BUN 27*  --  27*  CREATININE 1.56* 1.42* 1.41*  CALCIUM 9.3  --  9.2   GFR: Estimated Creatinine Clearance: 39.3 mL/min (A) (by C-G formula based on SCr of 1.41 mg/dL (H)). Liver Function Tests: No results for input(s): AST, ALT, ALKPHOS, BILITOT, PROT, ALBUMIN in the last 168 hours. No results for input(s): LIPASE, AMYLASE in the last 168 hours. No results for input(s): AMMONIA in the last 168 hours. Coagulation Profile: Recent Labs  Lab 12/13/21 1905  INR 1.1   Cardiac Enzymes: No results for input(s): CKTOTAL, CKMB, CKMBINDEX, TROPONINI in the last 168 hours. BNP (last 3 results) No results for input(s): PROBNP in the last 8760 hours. HbA1C: No results for input(s): HGBA1C  in the last 72 hours. CBG: No results for input(s): GLUCAP in the last 168 hours. Lipid Profile: No results for input(s): CHOL, HDL, LDLCALC, TRIG, CHOLHDL, LDLDIRECT in the last 72 hours. Thyroid Function Tests: No results for input(s): TSH, T4TOTAL, FREET4, T3FREE, THYROIDAB in the last 72 hours. Anemia Panel: No results for input(s): VITAMINB12, FOLATE, FERRITIN, TIBC, IRON, RETICCTPCT in the last 72 hours. Sepsis Labs: No results for input(s): PROCALCITON, LATICACIDVEN in the last 168 hours.  Recent Results (from the past 240 hour(s))  Resp Panel by RT-PCR (Flu A&B, Covid) Nasopharyngeal Swab     Status: None   Collection Time: 12/13/21  3:52 PM   Specimen: Nasopharyngeal Swab; Nasopharyngeal(NP) swabs in vial transport medium  Result Value Ref Range Status   SARS Coronavirus 2 by RT PCR NEGATIVE NEGATIVE Final    Comment: (NOTE) SARS-CoV-2 target nucleic acids are NOT DETECTED.  The SARS-CoV-2 RNA is generally detectable in upper respiratory specimens during the acute phase of infection. The lowest concentration of SARS-CoV-2 viral copies this assay can detect is 138 copies/mL. A negative result does not preclude SARS-Cov-2 infection and should not be used as the sole basis for treatment or other patient management decisions. A negative result may occur with  improper specimen collection/handling, submission of specimen other than nasopharyngeal swab, presence of viral mutation(s) within the areas targeted by this assay, and inadequate number of viral copies(<138 copies/mL). A negative result must be combined with clinical observations, patient history, and epidemiological information. The expected result is Negative.  Fact Sheet for Patients:  EntrepreneurPulse.com.au  Fact Sheet for Healthcare Providers:  IncredibleEmployment.be  This test is no t yet approved or cleared by the [place] FDA and  has been authorized for detection  and/or diagnosis of SARS-CoV-2 by FDA under an Emergency Use Authorization (EUA). This EUA will remain  in effect (meaning this test can be used) for the duration of the COVID-19 declaration under Section 564(b)(1) of the Act, 21 U.S.C.section 360bbb-3(b)(1), unless the authorization is terminated  or revoked sooner.       Influenza A by PCR NEGATIVE NEGATIVE Final   Influenza B by PCR NEGATIVE NEGATIVE Final    Comment: (NOTE) The Xpert Xpress SARS-CoV-2/FLU/RSV plus assay is intended as an aid in the diagnosis of influenza from Nasopharyngeal swab specimens and should not be used as a sole basis for treatment. Nasal washings and aspirates are unacceptable for Xpert Xpress SARS-CoV-2/FLU/RSV testing.  Fact Sheet for Patients: EntrepreneurPulse.com.au  Fact Sheet for Healthcare Providers: IncredibleEmployment.be  This test is not yet approved or cleared by the [place] FDA and has been authorized for detection and/or diagnosis of SARS-CoV-2 by FDA under an Emergency Use Authorization (EUA). This EUA will remain in  effect (meaning this test can be used) for the duration of the COVID-19 declaration under Section 564(b)(1) of the Act, 21 U.S.C. section 360bbb-3(b)(1), unless the authorization is terminated or revoked.  Performed at [facility], [address], [place], [place] [address]       Radiology Studies: DG Chest 2 View  Result Date: 12/13/2021 CLINICAL DATA:  Chest pain EXAM: CHEST - 2 VIEW COMPARISON:  08/29/2015 FINDINGS: Heart size within normal limits. Aortic atherosclerosis. No focal airspace consolidation, pleural effusion, or pneumothorax. Chronic mild compression deformity of T11. IMPRESSION: No active cardiopulmonary disease. Electronically Signed   By: Davina Poke D.O.   On: 12/13/2021 11:56   ECHOCARDIOGRAM COMPLETE  Result Date: 12/14/2021    ECHOCARDIOGRAM REPORT   Patient Name:   Trevor Deleon. Date of Exam: 12/14/2021 Medical Rec #:  [id number]            Height:       71.0 in Accession #:    [id number]           Weight:       178.6 lb Date of Birth:  [birth year]            BSA:          2.009 m Patient Age:    37 years             BP:           159/68 mmHg Patient Gender: M                    HR:           63 bpm. Exam Location:  [facility] Procedure: 2D Echo, Color Doppler and Cardiac Doppler Indications:     Chest pain R07.9  History:         Patient has prior history of Echocardiogram examinations, most                  recent 09/01/2015. Previous Myocardial Infarction; Risk                  Factors:Diabetes and Hypertension. CKD.  Sonographer:     Sherrie Sport Referring Phys:  Roland [place] Diagnosing Phys: Yolonda Kida MD  Sonographer Comments: Suboptimal parasternal window and no subcostal window. IMPRESSIONS  1. Left ventricular ejection fraction, by estimation, is 60 to 65%. The left ventricle has normal function. The left ventricle has no regional wall motion abnormalities. Left ventricular diastolic parameters are consistent with Grade I diastolic dysfunction (impaired relaxation).  2. Right ventricular systolic function is normal. The right ventricular size is normal.  3. The mitral valve is normal in structure. No evidence of mitral valve regurgitation.  4. The aortic valve is grossly normal. Aortic valve regurgitation is not visualized. FINDINGS  Left Ventricle: Left ventricular ejection fraction, by estimation, is 60 to 65%. The left ventricle has normal function. The left ventricle has no regional wall motion abnormalities. The left ventricular internal cavity size was normal in size. There is  no left ventricular hypertrophy. Left ventricular diastolic parameters are consistent with Grade I diastolic dysfunction (impaired relaxation). Right Ventricle: The right ventricular size is normal. No increase in right ventricular wall thickness. Right ventricular systolic function is  normal. Left Atrium: Left atrial size was normal in size. Right Atrium: Right atrial size was normal in size. Pericardium: There is no evidence of pericardial effusion. Mitral Valve: The mitral valve is normal in structure.  No evidence of mitral valve regurgitation. MV peak gradient, 5.6 mmHg. The mean mitral valve gradient is 2.0 mmHg. Tricuspid Valve: The tricuspid valve is normal in structure. Tricuspid valve regurgitation is trivial. Aortic Valve: The aortic valve is grossly normal. Aortic valve regurgitation is not visualized. Aortic valve mean gradient measures 2.0 mmHg. Aortic valve peak gradient measures 3.4 mmHg. Aortic valve area, by VTI measures 3.68 cm. Pulmonic Valve: The pulmonic valve was normal in structure. Pulmonic valve regurgitation is not visualized. Aorta: The ascending aorta was not well visualized. IAS/Shunts: No atrial level shunt detected by color flow Doppler.  LEFT VENTRICLE PLAX 2D LVIDd:         3.90 cm   Diastology LVIDs:         2.40 cm   LV e' medial:    8.16 cm/s LV PW:         1.20 cm   LV E/e' medial:  4.5 LV IVS:        1.20 cm   LV e' lateral:   8.16 cm/s LVOT diam:     2.10 cm   LV E/e' lateral: 4.5 LV SV:         60 LV SV Index:   30 LVOT Area:     3.46 cm  RIGHT VENTRICLE RV S prime:     17.30 cm/s TAPSE (M-mode): 1.3 cm LEFT ATRIUM           Index        RIGHT ATRIUM           Index LA diam:      3.90 cm 1.94 cm/m   RA Area:     22.60 cm LA Vol (A2C): 25.7 ml 12.79 ml/m  RA Volume:   64.90 ml  32.30 ml/m LA Vol (A4C): 40.0 ml 19.91 ml/m  AORTIC VALVE AV Area (Vmax):    3.23 cm AV Area (Vmean):   2.99 cm AV Area (VTI):     3.68 cm AV Vmax:           91.80 cm/s AV Vmean:          65.200 cm/s AV VTI:            0.163 m AV Peak Grad:      3.4 mmHg AV Mean Grad:      2.0 mmHg LVOT Vmax:         85.50 cm/s LVOT Vmean:        56.300 cm/s LVOT VTI:          0.173 m LVOT/AV VTI ratio: 1.06  AORTA Ao Root diam: 3.83 cm MITRAL VALVE               TRICUSPID VALVE MV Area  (PHT): 4.57 cm    TR Peak grad:   27.5 mmHg MV Area VTI:   3.36 cm    TR Vmax:        262.00 cm/s MV Peak grad:  5.6 mmHg MV Mean grad:  2.0 mmHg    SHUNTS MV Vmax:       1.18 m/s    Systemic VTI:  0.17 m MV Vmean:      61.9 cm/s   Systemic Diam: 2.10 cm MV Decel Time: 166 msec MV E velocity: 36.40 cm/s MV A velocity: 99.40 cm/s MV E/A ratio:  0.37 Dwayne D Callwood MD Electronically signed by Yolonda Kida MD Signature Date/Time: 12/14/2021/1:41:46 PM    Final    CT Angio Chest/Abd/Pel for  Dissection W and/or Wo Contrast  Result Date: 12/13/2021 CLINICAL DATA:  Acute aortic syndrome.  Chest pain beginning today. EXAM: CT ANGIOGRAPHY CHEST, ABDOMEN AND PELVIS TECHNIQUE: Multidetector CT imaging through the chest, abdomen and pelvis was performed using the standard protocol during bolus administration of intravenous contrast. Multiplanar reconstructed images and MIPs were obtained and reviewed to evaluate the vascular anatomy. RADIATION DOSE REDUCTION: This exam was performed according to the departmental dose-optimization program which includes automated exposure control, adjustment of the mA and/or kV according to patient size and/or use of iterative reconstruction technique. CONTRAST:  6mL OMNIPAQUE IOHEXOL 350 MG/ML SOLN COMPARISON:  Chest radiography same day. MRI 08/24/2021. CT 03/10/2021. CT 04/23/2019. FINDINGS: CTA CHEST FINDINGS Cardiovascular: The heart is at the upper limits of normal in size. Coronary artery calcification is present. Aortic atherosclerotic calcification is present. The aortic is tortuous. Maximal diameter of the ascending aorta is measured at 3.8 cm. Aortic branching pattern is normal without evidence of brachiocephalic vessel origin stenosis. No evidence of aortic dissection or acute ulceration. Mediastinum/Nodes: No mass or adenopathy. Lungs/Pleura: No pleural effusion. No pulmonary infiltrate, collapse, mass or worrisome nodule. 4 mm subsolid nodule in the lateral right  middle lobe is unchanged since June of 2020 and benign. Musculoskeletal: Old superior endplate fracture at B09 and minimal superior endplate deformity at G28 are unchanged. Review of the MIP images confirms the above findings. CTA ABDOMEN AND PELVIS FINDINGS VASCULAR Aorta: Aortic atherosclerosis. No aneurysm. No dissection. Maximal diameter 2.4 cm. Celiac: Atherosclerosis at the origin but without stenosis. SMA: Atherosclerosis at the origin.  30% stenosis. Renals: Atherosclerosis at both renal artery origins. 40% stenosis on the right. No measurable stenosis on the left. IMA: Patent.  50% stenosis at the origin. Inflow: Normal Veins: Normal Review of the MIP images confirms the above findings. NON-VASCULAR Hepatobiliary: Liver parenchyma is normal. Previous cholecystectomy. Pancreas: Normal Spleen: Normal Adrenals/Urinary Tract: Adrenal glands are normal. No hydronephrosis of the kidneys. Simple appearing cysts on the right. Some renal atrophic changes, right more than left. Bladder is normal. Stomach/Bowel: Stomach and small intestine are unremarkable. Diverticulosis of the left colon without evidence of diverticulitis Lymphatic: No lymphadenopathy. Reproductive: Enlarged prostate. Other: Left inguinal hernia containing only fat. Musculoskeletal: Chronic lumbar degenerative changes. Review of the MIP images confirms the above findings. IMPRESSION: No acute vascular finding. Coronary artery calcification. Aortic atherosclerotic calcification. Maximal diameter of the ascending aorta measured at 3.8 cm. Aortic atherosclerosis without evidence of dissection or penetrating ulcer. Abdominal aortic atherosclerosis. Maximal diameter of the infrarenal abdominal aorta is only 2.4 cm. Atherosclerosis of the aortic branch vessels, most notably 40% stenosis of the right renal artery and 50% stenosis of the inferior mesenteric artery. No acute lung finding. Stable 4 mm nodule in the right middle lobe, as distant as 04/23/2019,  therefore benign. No acute abdominal organ pathology visible. Enlargement of the prostate gland. Some atrophic changes of the kidneys. Electronically Signed   By: Nelson Chimes M.D.   On: 12/13/2021 14:27     Scheduled Meds:  aspirin  81 mg Oral Pre-Cath   aspirin  81 mg Oral Pre-Cath   aspirin EC  81 mg Oral Daily   doxazosin  8 mg Oral QHS   losartan  25 mg Oral Daily   pantoprazole  40 mg Oral Daily   simvastatin  40 mg Oral QHS   sodium chloride flush  3 mL Intravenous Q12H   sodium chloride flush  3 mL Intravenous Q12H   sodium chloride flush  3 mL Intravenous Q12H   Continuous Infusions:  sodium chloride     sodium chloride     sodium chloride     sodium chloride     Followed by   sodium chloride     sodium chloride     Followed by   sodium chloride     sodium chloride     Followed by   sodium chloride     heparin 800 Units/hr (12/15/21 0207)     LOS: 0 days     Enzo Bi, MD [facility] If 7PM-7AM, please contact night-coverage 12/15/2021, 3:14 AM

## 2021-12-14 NOTE — Care Management Obs Status (Signed)
[place] NOTIFICATION   Patient Details  Name: Trevor Deleon. MRN: [id number] Date of Birth: Mar 31, 1932   Medicare Observation Status Notification Given:  Yes    Shelbie Hutching, RN 12/14/2021, 12:20 PM

## 2021-12-14 NOTE — ED Notes (Signed)
Pt placed on hospital bed

## 2021-12-14 NOTE — Consult Note (Signed)
CARDIOLOGY CONSULT NOTE            Patient ID: Trevor Deleon. MRN: [id number] DOB/AGE: 1932/06/13 88 y.o.  Admit date: 12/13/2021 Referring Physician Dr. Si Raider Primary Physician Ezequiel Kayser Primary Cardiologist Dr. Ubaldo Glassing Reason for Consultation chest pain  HPI: The patient is an 88 year old male with a past medical history significant for CAD s/p MI 1999 with 70% mid RCA, 50% distal LAD, 50% ramus, 70% proximal circumflex 100% OM2, CKD, hyperlipidemia, hypertension, type 2 diabetes, thrombocytopenia, BPH who presented to [facility] ED 12/13/2021 with acute onset of chest pain when he woke up yesterday morning that was relieved with SL nitro.  Cardiology is consulted for further investigation of his chest pain and elevated troponin.  The patient was seen by his regular cardiologist Dr. Ubaldo Glassing 10/2021 where his maxide and Imdur were discontinued due to dizziness and leg swelling.  At that visit his blood pressures at home are between the 1 teens/150s.  The patient had a stress test on 11/07/2017 that revealed normal LV function without evidence of reversible ischemia, repeat study done in 2021 showed small area of mild inferior hypoperfusion with both stress and rest with borderline reversibility. Medical management of this risk factors was continued.   The patient presented to the hospital yesterday reporting substernal chest pain he rated a 3/10 that has been intermittently occurring for the past ~4 months or so. He thought the pain might be related to acid reflux so he took a protonix and a nitroglycerin and it somewhat improved. He had recurrence of this same substernal tightness when he got up to use the restroom last evening but it went away on its own after 15 minutes or so. He states he had some shortness of breath with the initial episode. Denies radiation of the pain, diaphoresis, palpitations, or syncope lower extremity edema.   Vitals are significant for a blood pressure mostly greater  than 937 systolic peaking at 342/87 overnight.  Heart rate between 50-64.  Labs are significant for creatinine of 1.41, EGFR 48, up from his baseline in 09/2021 at 1.2 and 57.  Troponins trended 32-54-87-66-50.  H&H 13.7, 37.7, platelets 98 which appear to be around his baseline.   Review of systems complete and found to be negative unless listed above    Past Medical History:  Diagnosis Date   Arthritis    BPH (benign prostatic hyperplasia)    CKD (chronic kidney disease) stage 3, GFR 30-59 ml/min (HCC)    Coronary artery disease    Diabetes mellitus without complication (HCC)    Pt does not take medications.   Duodenum disorder    BRUNNERS GLAND   ED (erectile dysfunction)    GERD (gastroesophageal reflux disease)    Hernia, femoral    History of hiatal hernia    Hyperlipidemia    Hypertension    Hypogonadism in male    Labyrinthitis    Myocardial infarction ([place])    1999   Pancytopenia ([place])    Stricture esophagus     Past Surgical History:  Procedure Laterality Date   APPENDECTOMY     CARDIAC CATHETERIZATION     CARDIAC SURGERY     CHOLECYSTECTOMY N/A 09/02/2015   Procedure: LAPAROSCOPIC CHOLECYSTECTOMY;  Surgeon: Leonie Green, MD;  Location: [facility] ORS;  Service: General;  Laterality: N/A;   COLONOSCOPY     CORONARY ANGIOPLASTY     ERCP Left 09/03/2015   Procedure: ENDOSCOPIC RETROGRADE CHOLANGIOPANCREATOGRAPHY (ERCP);  Surgeon: Hulen Luster, MD;  Location: [facility] ENDOSCOPY;  Service: Endoscopy;  Laterality: Left;   ESOPHAGOGASTRODUODENOSCOPY (EGD) WITH PROPOFOL N/A 08/29/2017   Procedure: ESOPHAGOGASTRODUODENOSCOPY (EGD) WITH PROPOFOL;  Surgeon: Toledo, Benay Pike, MD;  Location: [facility] ENDOSCOPY;  Service: Gastroenterology;  Laterality: N/A;   FLEXIBLE SIGMOIDOSCOPY     HERNIA REPAIR     INGUINAL HERNIA REPAIR Left 12/15/2015   Procedure: HERNIA REPAIR INGUINAL ADULT;  Surgeon: Leonie Green, MD;  Location: [facility] OR[facility];  Service: General;  Laterality: Left;    INTRAOPERATIVE CHOLANGIOGRAM  09/02/2015   Procedure: INTRAOPERATIVE CHOLANGIOGRAM;  Surgeon: Leonie Green, MD;  Location: [facility] ORS;  Service: General;;   JOINT REPLACEMENT Left    LIPOMA EXCISION     PROSTATE SURGERY  12/17/2014   TONSILLECTOMY      (Not in a hospital admission)  Social History   Socioeconomic History   Marital status: Married    Spouse name: Not on file   Number of children: Not on file   Years of education: Not on file   Highest education level: Not on file  Occupational History   Not on file  Tobacco Use   Smoking status: Former    Packs/day: 1.00    Years: 15.00    Pack years: 15.00    Types: Cigarettes   Smokeless tobacco: Never  Vaping Use   Vaping Use: Never used  Substance and Sexual Activity   Alcohol use: No   Drug use: No   Sexual activity: Not on file  Other Topics Concern   Not on file  Social History Narrative   Not on file   Social Determinants of Health   Financial Resource Strain: Not on file  Food Insecurity: Not on file  Transportation Needs: Not on file  Physical Activity: Not on file  Stress: Not on file  Social Connections: Not on file  Intimate Partner Violence: Not on file    Family History  Problem Relation Age of Onset   Hypertension Mother    Stroke Mother    Prostate cancer Father    Heart attack Father       Review of systems complete and found to be negative unless listed above    PHYSICAL EXAM General: Elderly Caucasian male, well nourished, in no acute distress.  Sitting upright eating breakfast in ED bed. HEENT:  Normocephalic and atraumatic. Neck:  No JVD.  Lungs: Normal respiratory effort on room air. Clear bilaterally to auscultation. No wheezes, crackles, rhonchi.  Heart: HRRR . Normal S1 and S2 without gallops or murmurs. Radial & DP pulses 2+ bilaterally. Abdomen: Non-distended appearing.  Msk: Normal strength and tone for age. Extremities: No clubbing, cyanosis or edema.   Neuro: Alert  and oriented X 3. Psych:  Mood appropriate, affect congruent.   Labs:   Lab Results  Component Value Date   WBC 5.1 12/14/2021   HGB 13.7 12/14/2021   HCT 37.7 (L) 12/14/2021   MCV 84.0 12/14/2021   PLT 98 (L) 12/14/2021    Recent Labs  Lab 12/14/21 0221  NA 137  K 3.5  CL 104  CO2 25  BUN 27*  CREATININE 1.41*  CALCIUM 9.2  GLUCOSE 113*   Lab Results  Component Value Date   TROPONINI <0.03 08/29/2015   No results found for: CHOL No results found for: HDL No results found for: LDLCALC No results found for: TRIG No results found for: CHOLHDL No results found for: LDLDIRECT    Radiology: DG Chest 2 View  Result Date: 12/13/2021 CLINICAL  DATA:  Chest pain EXAM: CHEST - 2 VIEW COMPARISON:  08/29/2015 FINDINGS: Heart size within normal limits. Aortic atherosclerosis. No focal airspace consolidation, pleural effusion, or pneumothorax. Chronic mild compression deformity of T11. IMPRESSION: No active cardiopulmonary disease. Electronically Signed   By: Davina Poke D.O.   On: 12/13/2021 11:56   CT Angio Chest/Abd/Pel for Dissection W and/or Wo Contrast  Result Date: 12/13/2021 CLINICAL DATA:  Acute aortic syndrome.  Chest pain beginning today. EXAM: CT ANGIOGRAPHY CHEST, ABDOMEN AND PELVIS TECHNIQUE: Multidetector CT imaging through the chest, abdomen and pelvis was performed using the standard protocol during bolus administration of intravenous contrast. Multiplanar reconstructed images and MIPs were obtained and reviewed to evaluate the vascular anatomy. RADIATION DOSE REDUCTION: This exam was performed according to the departmental dose-optimization program which includes automated exposure control, adjustment of the mA and/or kV according to patient size and/or use of iterative reconstruction technique. CONTRAST:  44m OMNIPAQUE IOHEXOL 350 MG/ML SOLN COMPARISON:  Chest radiography same day. MRI 08/24/2021. CT 03/10/2021. CT 04/23/2019. FINDINGS: CTA CHEST FINDINGS  Cardiovascular: The heart is at the upper limits of normal in size. Coronary artery calcification is present. Aortic atherosclerotic calcification is present. The aortic is tortuous. Maximal diameter of the ascending aorta is measured at 3.8 cm. Aortic branching pattern is normal without evidence of brachiocephalic vessel origin stenosis. No evidence of aortic dissection or acute ulceration. Mediastinum/Nodes: No mass or adenopathy. Lungs/Pleura: No pleural effusion. No pulmonary infiltrate, collapse, mass or worrisome nodule. 4 mm subsolid nodule in the lateral right middle lobe is unchanged since June of 2020 and benign. Musculoskeletal: Old superior endplate fracture at TZ60and minimal superior endplate deformity at TF09are unchanged. Review of the MIP images confirms the above findings. CTA ABDOMEN AND PELVIS FINDINGS VASCULAR Aorta: Aortic atherosclerosis. No aneurysm. No dissection. Maximal diameter 2.4 cm. Celiac: Atherosclerosis at the origin but without stenosis. SMA: Atherosclerosis at the origin.  30% stenosis. Renals: Atherosclerosis at both renal artery origins. 40% stenosis on the right. No measurable stenosis on the left. IMA: Patent.  50% stenosis at the origin. Inflow: Normal Veins: Normal Review of the MIP images confirms the above findings. NON-VASCULAR Hepatobiliary: Liver parenchyma is normal. Previous cholecystectomy. Pancreas: Normal Spleen: Normal Adrenals/Urinary Tract: Adrenal glands are normal. No hydronephrosis of the kidneys. Simple appearing cysts on the right. Some renal atrophic changes, right more than left. Bladder is normal. Stomach/Bowel: Stomach and small intestine are unremarkable. Diverticulosis of the left colon without evidence of diverticulitis Lymphatic: No lymphadenopathy. Reproductive: Enlarged prostate. Other: Left inguinal hernia containing only fat. Musculoskeletal: Chronic lumbar degenerative changes. Review of the MIP images confirms the above findings. IMPRESSION:  No acute vascular finding. Coronary artery calcification. Aortic atherosclerotic calcification. Maximal diameter of the ascending aorta measured at 3.8 cm. Aortic atherosclerosis without evidence of dissection or penetrating ulcer. Abdominal aortic atherosclerosis. Maximal diameter of the infrarenal abdominal aorta is only 2.4 cm. Atherosclerosis of the aortic branch vessels, most notably 40% stenosis of the right renal artery and 50% stenosis of the inferior mesenteric artery. No acute lung finding. Stable 4 mm nodule in the right middle lobe, as distant as 04/23/2019, therefore benign. No acute abdominal organ pathology visible. Enlargement of the prostate gland. Some atrophic changes of the kidneys. Electronically Signed   By: MNelson ChimesM.D.   On: 12/13/2021 14:27    ECHO LVEF from 2021 Lexiscan stress test >60%  TELEMETRY reviewed by me: Sinus rhythm 102  EKG reviewed by me: Normal sinus rhythm rate 88  with PVCs  ASSESSMENT AND PLAN:  The patient is an 88 year old male with a past medical history significant for CAD s/p MI 1999 s/p DES to RCA - 70% mid RCA, 50% distal LAD, 50% ramus, 70% proximal circumflex 100% OM2, CKD, hyperlipidemia, hypertension, type 2 diabetes, thrombocytopenia, BPH who presented to [facility] ED 12/13/2021 with acute onset of chest pain when he woke up yesterday morning that was relieved with SL nitro.  Cardiology is consulted for further investigation of his chest pain and elevated troponin.  #atypical chest pain #Elevated troponin  #CAD s/p MI 1999 with DES to RCA and residual disease The patient presented with 3/10 substernal chest pain that has been intermittent for the past few months, but this episode but lasted a bit longer. He states it is sometimes relieved with protonix and sometimes with SL nitro. His EKG is without acute ischemic changes. troponins mildly elevated and trended 32-54-87-66-50 in the setting of probable residual CAD and increased pain at rest and with  minimal activity. -S/p aspirin 345m at home yesterday, continue daily baby aspirin with close monitoring of his platelets -continue heparin for 48 hours.  -Monitor on telemetry while inpatient -Repeat echocardiogram and will follow results -Dr. CClayborn Bignessrecommends further ischemic workup with cardiac cath to be performed tomorrow morning. Discussed with the patient the risks and benefits of cardiac cath and written informed consent will be obtained.   #Hypertension #Hyperlipidemia Current blood pressure regimen is tamsulosin and doxazosin 830m  He was taken off losartan 2528mt his outpatient cardiology visit 1 month ago however it was restarted on admission and the patient appears to be tolerating it without difficulty today.  -continue simvastatin 36m88mCKD -creatinine of 1.41, EGFR 48, slightly up from his baseline in 09/2021 at 1.2 and 57  This patient's plan of care was discussed and created with Dr. DwayLujean Amel he is in agreement.  Signed: LilyTristan SchroederA-C 12/14/2021, 8:19 AM

## 2021-12-15 ENCOUNTER — Encounter
Admission: EM | Disposition: A | Discharge status: Other Acute Inpatient Hospital | Payer: Self-pay | Source: Home / Self Care | Attending: Internal Medicine

## 2021-12-15 DIAGNOSIS — E785 Hyperlipidemia, unspecified: Secondary | ICD-10-CM | POA: Diagnosis present

## 2021-12-15 DIAGNOSIS — E1122 Type 2 diabetes mellitus with diabetic chronic kidney disease: Secondary | ICD-10-CM | POA: Diagnosis present

## 2021-12-15 DIAGNOSIS — I252 Old myocardial infarction: Secondary | ICD-10-CM | POA: Diagnosis not present

## 2021-12-15 DIAGNOSIS — R351 Nocturia: Secondary | ICD-10-CM | POA: Diagnosis present

## 2021-12-15 DIAGNOSIS — K219 Gastro-esophageal reflux disease without esophagitis: Secondary | ICD-10-CM | POA: Diagnosis present

## 2021-12-15 DIAGNOSIS — I200 Unstable angina: Secondary | ICD-10-CM | POA: Diagnosis present

## 2021-12-15 DIAGNOSIS — Z66 Do not resuscitate: Secondary | ICD-10-CM | POA: Diagnosis present

## 2021-12-15 DIAGNOSIS — Z7982 Long term (current) use of aspirin: Secondary | ICD-10-CM | POA: Diagnosis not present

## 2021-12-15 DIAGNOSIS — I2510 Atherosclerotic heart disease of native coronary artery without angina pectoris: Secondary | ICD-10-CM | POA: Diagnosis present

## 2021-12-15 DIAGNOSIS — I129 Hypertensive chronic kidney disease with stage 1 through stage 4 chronic kidney disease, or unspecified chronic kidney disease: Secondary | ICD-10-CM | POA: Diagnosis present

## 2021-12-15 DIAGNOSIS — I214 Non-ST elevation (NSTEMI) myocardial infarction: Secondary | ICD-10-CM | POA: Diagnosis present

## 2021-12-15 DIAGNOSIS — N401 Enlarged prostate with lower urinary tract symptoms: Secondary | ICD-10-CM | POA: Diagnosis present

## 2021-12-15 DIAGNOSIS — Z20822 Contact with and (suspected) exposure to covid-19: Secondary | ICD-10-CM | POA: Diagnosis present

## 2021-12-15 DIAGNOSIS — Z9049 Acquired absence of other specified parts of digestive tract: Secondary | ICD-10-CM | POA: Diagnosis not present

## 2021-12-15 DIAGNOSIS — R079 Chest pain, unspecified: Secondary | ICD-10-CM | POA: Diagnosis not present

## 2021-12-15 DIAGNOSIS — F411 Generalized anxiety disorder: Secondary | ICD-10-CM | POA: Diagnosis present

## 2021-12-15 DIAGNOSIS — Z79899 Other long term (current) drug therapy: Secondary | ICD-10-CM | POA: Diagnosis not present

## 2021-12-15 DIAGNOSIS — Z955 Presence of coronary angioplasty implant and graft: Secondary | ICD-10-CM | POA: Diagnosis not present

## 2021-12-15 DIAGNOSIS — Z8249 Family history of ischemic heart disease and other diseases of the circulatory system: Secondary | ICD-10-CM | POA: Diagnosis not present

## 2021-12-15 DIAGNOSIS — Z8042 Family history of malignant neoplasm of prostate: Secondary | ICD-10-CM | POA: Diagnosis not present

## 2021-12-15 DIAGNOSIS — R0789 Other chest pain: Secondary | ICD-10-CM | POA: Diagnosis present

## 2021-12-15 DIAGNOSIS — Z87891 Personal history of nicotine dependence: Secondary | ICD-10-CM | POA: Diagnosis not present

## 2021-12-15 DIAGNOSIS — N1832 Chronic kidney disease, stage 3b: Secondary | ICD-10-CM | POA: Diagnosis present

## 2021-12-15 DIAGNOSIS — N179 Acute kidney failure, unspecified: Secondary | ICD-10-CM | POA: Diagnosis present

## 2021-12-15 DIAGNOSIS — Z823 Family history of stroke: Secondary | ICD-10-CM | POA: Diagnosis not present

## 2021-12-15 HISTORY — PX: LEFT HEART CATH AND CORONARY ANGIOGRAPHY: CATH118249

## 2021-12-15 LAB — CBC
HCT: 36.9 % — ABNORMAL LOW (ref 39.0–52.0)
Hemoglobin: 13.1 g/dL (ref 13.0–17.0)
MCH: 30.1 pg (ref 26.0–34.0)
MCHC: 35.5 g/dL (ref 30.0–36.0)
MCV: 84.8 fL (ref 80.0–100.0)
Platelets: 93 10*3/uL — ABNORMAL LOW (ref 150–400)
RBC: 4.35 MIL/uL (ref 4.22–5.81)
RDW: 12.6 % (ref 11.5–15.5)
WBC: 4.3 10*3/uL (ref 4.0–10.5)
nRBC: 0 % (ref 0.0–0.2)

## 2021-12-15 LAB — BASIC METABOLIC PANEL
Anion gap: 9 (ref 5–15)
BUN: 28 mg/dL — ABNORMAL HIGH (ref 8–23)
CO2: 24 mmol/L (ref 22–32)
Calcium: 8.9 mg/dL (ref 8.9–10.3)
Chloride: 104 mmol/L (ref 98–111)
Creatinine, Ser: 1.57 mg/dL — ABNORMAL HIGH (ref 0.61–1.24)
GFR, Estimated: 42 mL/min — ABNORMAL LOW (ref >60)
Glucose, Bld: 119 mg/dL — ABNORMAL HIGH (ref 70–99)
Potassium: 3.5 mmol/L (ref 3.5–5.1)
Sodium: 137 mmol/L (ref 135–145)

## 2021-12-15 LAB — HEPARIN LEVEL (UNFRACTIONATED): Heparin Unfractionated: 0.72 IU/mL — ABNORMAL HIGH (ref 0.30–0.70)

## 2021-12-15 LAB — MAGNESIUM: Magnesium: 2.1 mg/dL (ref 1.7–2.4)

## 2021-12-15 SURGERY — LEFT HEART CATH AND CORONARY ANGIOGRAPHY
Anesthesia: Moderate Sedation

## 2021-12-15 MED ORDER — HEPARIN (PORCINE) 25000 UT/250ML-% IV SOLN
700.0000 [IU]/h | INTRAVENOUS | Status: DC
  Administered 2021-12-15: 08:00:00 700 [IU]/h via INTRAVENOUS
  Filled 2021-12-15: qty 250

## 2021-12-15 MED ORDER — VERAPAMIL HCL 2.5 MG/ML IV SOLN
INTRAVENOUS | Status: AC
  Filled 2021-12-15: qty 2

## 2021-12-15 MED ORDER — IOHEXOL 300 MG/ML  SOLN
INTRAMUSCULAR | Status: DC | PRN
  Administered 2021-12-15: 14:00:00 128 mL

## 2021-12-15 MED ORDER — HEPARIN (PORCINE) IN NACL 1000-0.9 UT/500ML-% IV SOLN
INTRAVENOUS | Status: AC
  Filled 2021-12-15: qty 1000

## 2021-12-15 MED ORDER — LABETALOL HCL 5 MG/ML IV SOLN
INTRAVENOUS | Status: AC
  Filled 2021-12-15: qty 4

## 2021-12-15 MED ORDER — HEPARIN SODIUM (PORCINE) 1000 UNIT/ML IJ SOLN
INTRAMUSCULAR | Status: DC | PRN
  Administered 2021-12-15: 4000 [IU] via INTRAVENOUS

## 2021-12-15 MED ORDER — MIDAZOLAM HCL 2 MG/2ML IJ SOLN
INTRAMUSCULAR | Status: AC
  Filled 2021-12-15: qty 2

## 2021-12-15 MED ORDER — LIDOCAINE HCL (PF) 1 % IJ SOLN
INTRAMUSCULAR | Status: DC | PRN
  Administered 2021-12-15: 2 mL

## 2021-12-15 MED ORDER — FENTANYL CITRATE (PF) 100 MCG/2ML IJ SOLN
INTRAMUSCULAR | Status: DC | PRN
  Administered 2021-12-15: 25 ug via INTRAVENOUS

## 2021-12-15 MED ORDER — SODIUM CHLORIDE 0.9 % WEIGHT BASED INFUSION: 1.0000 mL/kg/h | INTRAVENOUS | Status: AC

## 2021-12-15 MED ORDER — LOSARTAN POTASSIUM 50 MG PO TABS
50.0000 mg | ORAL_TABLET | Freq: Every day | ORAL | Status: DC
  Administered 2021-12-16: 50 mg via ORAL
  Filled 2021-12-15 (×2): qty 1

## 2021-12-15 MED ORDER — SODIUM CHLORIDE 0.9% FLUSH
3.0000 mL | Freq: Two times a day (BID) | INTRAVENOUS | Status: DC
  Administered 2021-12-15 – 2021-12-16 (×2): 3 mL via INTRAVENOUS

## 2021-12-15 MED ORDER — HYDRALAZINE HCL 20 MG/ML IJ SOLN: 10.0000 mg | INTRAMUSCULAR | Status: AC | PRN

## 2021-12-15 MED ORDER — ONDANSETRON HCL 4 MG/2ML IJ SOLN: 4.0000 mg | Freq: Four times a day (QID) | INTRAMUSCULAR | Status: DC | PRN

## 2021-12-15 MED ORDER — MIDAZOLAM HCL 2 MG/2ML IJ SOLN
INTRAMUSCULAR | Status: DC | PRN
  Administered 2021-12-15: 1 mg via INTRAVENOUS

## 2021-12-15 MED ORDER — HEPARIN SODIUM (PORCINE) 1000 UNIT/ML IJ SOLN
INTRAMUSCULAR | Status: AC
  Filled 2021-12-15: qty 10

## 2021-12-15 MED ORDER — HEPARIN (PORCINE) IN NACL 1000-0.9 UT/500ML-% IV SOLN
INTRAVENOUS | Status: DC | PRN
  Administered 2021-12-15: 1000 mL

## 2021-12-15 MED ORDER — LABETALOL HCL 5 MG/ML IV SOLN
INTRAVENOUS | Status: DC | PRN
  Administered 2021-12-15: 10 mg via INTRAVENOUS

## 2021-12-15 MED ORDER — LABETALOL HCL 5 MG/ML IV SOLN: 10.0000 mg | INTRAVENOUS | Status: AC | PRN

## 2021-12-15 MED ORDER — FENTANYL CITRATE (PF) 100 MCG/2ML IJ SOLN
INTRAMUSCULAR | Status: AC
  Filled 2021-12-15: qty 2

## 2021-12-15 MED ORDER — SODIUM CHLORIDE 0.9% FLUSH: 3.0000 mL | INTRAVENOUS | Status: DC | PRN

## 2021-12-15 MED ORDER — ACETAMINOPHEN 325 MG PO TABS: 650.0000 mg | ORAL_TABLET | ORAL | Status: DC | PRN

## 2021-12-15 MED ORDER — LABETALOL HCL 5 MG/ML IV SOLN
INTRAVENOUS | Status: AC
  Administered 2021-12-15: 15:00:00 10 mg via INTRAVENOUS
  Filled 2021-12-15: qty 4

## 2021-12-15 MED ORDER — SODIUM CHLORIDE 0.9 % IV SOLN: 250.0000 mL | INTRAVENOUS | Status: DC | PRN

## 2021-12-15 SURGICAL SUPPLY — 12 items
CATH 5FR JL3.5 JR4 ANG PIG MP (CATHETERS) ×1 IMPLANT
DEVICE RAD TR BAND REGULAR (VASCULAR PRODUCTS) ×1 IMPLANT
DRAPE BRACHIAL (DRAPES) ×1 IMPLANT
GLIDESHEATH SLEND SS 6F .021 (SHEATH) ×1 IMPLANT
GUIDEWIRE INQWIRE 1.5J.035X260 (WIRE) IMPLANT
INQWIRE 1.5J .035X260CM (WIRE) ×2
PACK CARDIAC CATH (CUSTOM PROCEDURE TRAY) ×2 IMPLANT
PROTECTION STATION PRESSURIZED (MISCELLANEOUS) ×2
SET ATX SIMPLICITY (MISCELLANEOUS) ×1 IMPLANT
SHEATH 6FR 85 DEST SLENDER (SHEATH) ×1 IMPLANT
STATION PROTECTION PRESSURIZED (MISCELLANEOUS) IMPLANT
WIRE HITORQ VERSACORE ST 145CM (WIRE) ×1 IMPLANT

## 2021-12-15 NOTE — Progress Notes (Signed)
PROGRESS NOTE    Trevor Deleon.  GGE:[id number] DOB: [birth year] DOA: 12/13/2021 PCP: Latanya Maudlin, NP    Brief Narrative:  88 y.o. male with medical history significant for CAD, ckd3b, htn, dm, pancytopenia, who presents with the above.   Has had intermittent substernal mild chest pain last several months. Not exertional, doesn't radiate. Woke up with 2/10 pain today. Not pleuritic. No n/v/d. Has appetite. No fevers. No cough. Not exertional. Called EMS and they instructed him to take 4 baby aspirins which he did.  Cardiology consulted.  Plan for cardiac catheterization 1/25.  Also on heparin GTT.   Assessment & Plan:   Principal Problem:   Chest pain Active Problems:   Benign prostatic hyperplasia with nocturia   Chronic renal insufficiency, stage III (moderate) (HCC)   Pancytopenia (HCC)   Type 2 diabetes mellitus with stage 3 chronic kidney disease, without long-term current use of insulin (HCC)   Unstable angina (HCC)  Chest pain, concern for nstemi CAD Atypical chest pain but does have multiple risk factors, most recent nuclear stress in 2021 with mild non-reversible defect. CTA w/o signs acute process. EKG non-ischemic. GERD in ddx. Trops did rise 32>54>87. Cardiology consulted by EDP Plan: Heparin GTT x48 hours Continue home aspirin and statin Left heart catheterization planned 1/25  # Thrombocytopenia Chronic. Unclear if previously worked up - likely further outpt w/u   # T2DM Diet controlled, here glucose wnl - monitor glucose daily   # CKD 3b Stable - monitor   # BPH - home flomax   # GAD - home alpraz prn   # HTN - home doxazosin, losartan   DVT prophylaxis: Heparin GTT Code Status: DNR Family Communication: Daughter and spouse at bedside has Disposition Plan: Status is: Inpatient  Remains inpatient appropriate because: CAD, concern for anginal symptoms.  Plan for cardiac catheterization 1/25.       Level of care: Telemetry  Medical  Consultants:  Cardiology  Procedures:  Left heart catheterization 1/25  Antimicrobials: None   Subjective: Seen and examined.  Sitting up in bed.  No visible distress.  Chest pain-free.  Denies palpitations.  Objective: Vitals:   12/15/21 0402 12/15/21 0727 12/15/21 1137 12/15/21 1225  BP: (!) 147/76 (!) 163/75 (!) 144/71 (!) 182/87  Pulse: 66 67 73 78  Resp:  16 18 17   Temp: 97.7 F (36.5 C) 98.3 F (36.8 C) 97.8 F (36.6 C) 98 F (36.7 C)  TempSrc: Oral   Oral  SpO2: 96% 97% 98% 97%  Weight:      Height:        Intake/Output Summary (Last 24 hours) at 12/15/2021 1353 Last data filed at 12/15/2021 1147 Gross per 24 hour  Intake 252.11 ml  Output --  Net 252.11 ml   Filed Weights   12/13/21 1122  Weight: 81 kg    Examination:  General exam: No acute distress Respiratory system: Lungs clear.  Normal work of breathing.  Room air Cardiovascular system: S1-S2, RRR, no murmurs, no pedal edema Gastrointestinal system: Soft, NT/ND, normal bowel sounds Central nervous system: Alert and oriented. No focal neurological deficits. Extremities: Symmetric 5 x 5 power. Skin: No rashes, lesions or ulcers Psychiatry: Judgement and insight appear normal. Mood & affect appropriate.     Data Reviewed: I have personally reviewed following labs and imaging studies  CBC: Recent Labs  Lab 12/13/21 1124 12/13/21 1640 12/14/21 0221 12/15/21 0611  WBC 4.9 4.7 5.1 4.3  HGB 14.7 14.2 13.7 13.1  HCT 41.8 38.8* 37.7* 36.9*  MCV 85.5 84.5 84.0 84.8  PLT 90* 98* 98* 93*   Basic Metabolic Panel: Recent Labs  Lab 12/13/21 1124 12/13/21 1640 12/14/21 0221 12/15/21 0611  NA 137  --  137 137  K 3.7  --  3.5 3.5  CL 100  --  104 104  CO2 27  --  25 24  GLUCOSE 141*  --  113* 119*  BUN 27*  --  27* 28*  CREATININE 1.56* 1.42* 1.41* 1.57*  CALCIUM 9.3  --  9.2 8.9  MG  --   --   --  2.1   GFR: Estimated Creatinine Clearance: 35.3 mL/min (A) (by C-G formula  based on SCr of 1.57 mg/dL (H)). Liver Function Tests: No results for input(s): AST, ALT, ALKPHOS, BILITOT, PROT, ALBUMIN in the last 168 hours. No results for input(s): LIPASE, AMYLASE in the last 168 hours. No results for input(s): AMMONIA in the last 168 hours. Coagulation Profile: Recent Labs  Lab 12/13/21 1905  INR 1.1   Cardiac Enzymes: No results for input(s): CKTOTAL, CKMB, CKMBINDEX, TROPONINI in the last 168 hours. BNP (last 3 results) No results for input(s): PROBNP in the last 8760 hours. HbA1C: No results for input(s): HGBA1C in the last 72 hours. CBG: No results for input(s): GLUCAP in the last 168 hours. Lipid Profile: No results for input(s): CHOL, HDL, LDLCALC, TRIG, CHOLHDL, LDLDIRECT in the last 72 hours. Thyroid Function Tests: No results for input(s): TSH, T4TOTAL, FREET4, T3FREE, THYROIDAB in the last 72 hours. Anemia Panel: No results for input(s): VITAMINB12, FOLATE, FERRITIN, TIBC, IRON, RETICCTPCT in the last 72 hours. Sepsis Labs: No results for input(s): PROCALCITON, LATICACIDVEN in the last 168 hours.  Recent Results (from the past 240 hour(s))  Resp Panel by RT-PCR (Flu A&B, Covid) Nasopharyngeal Swab     Status: None   Collection Time: 12/13/21  3:52 PM   Specimen: Nasopharyngeal Swab; Nasopharyngeal(NP) swabs in vial transport medium  Result Value Ref Range Status   SARS Coronavirus 2 by RT PCR NEGATIVE NEGATIVE Final    Comment: (NOTE) SARS-CoV-2 target nucleic acids are NOT DETECTED.  The SARS-CoV-2 RNA is generally detectable in upper respiratory specimens during the acute phase of infection. The lowest concentration of SARS-CoV-2 viral copies this assay can detect is 138 copies/mL. A negative result does not preclude SARS-Cov-2 infection and should not be used as the sole basis for treatment or other patient management decisions. A negative result may occur with  improper specimen collection/handling, submission of specimen other than  nasopharyngeal swab, presence of viral mutation(s) within the areas targeted by this assay, and inadequate number of viral copies(<138 copies/mL). A negative result must be combined with clinical observations, patient history, and epidemiological information. The expected result is Negative.  Fact Sheet for Patients:  EntrepreneurPulse.com.au  Fact Sheet for Healthcare Providers:  IncredibleEmployment.be  This test is no t yet approved or cleared by the [place] FDA and  has been authorized for detection and/or diagnosis of SARS-CoV-2 by FDA under an Emergency Use Authorization (EUA). This EUA will remain  in effect (meaning this test can be used) for the duration of the COVID-19 declaration under Section 564(b)(1) of the Act, 21 U.S.C.section 360bbb-3(b)(1), unless the authorization is terminated  or revoked sooner.       Influenza A by PCR NEGATIVE NEGATIVE Final   Influenza B by PCR NEGATIVE NEGATIVE Final    Comment: (NOTE) The Xpert Xpress SARS-CoV-2/FLU/RSV plus assay is intended as  an aid in the diagnosis of influenza from Nasopharyngeal swab specimens and should not be used as a sole basis for treatment. Nasal washings and aspirates are unacceptable for Xpert Xpress SARS-CoV-2/FLU/RSV testing.  Fact Sheet for Patients: EntrepreneurPulse.com.au  Fact Sheet for Healthcare Providers: IncredibleEmployment.be  This test is not yet approved or cleared by the [place] FDA and has been authorized for detection and/or diagnosis of SARS-CoV-2 by FDA under an Emergency Use Authorization (EUA). This EUA will remain in effect (meaning this test can be used) for the duration of the COVID-19 declaration under Section 564(b)(1) of the Act, 21 U.S.C. section 360bbb-3(b)(1), unless the authorization is terminated or revoked.  Performed at [facility], [address], [place], [place]  [address]          Radiology Studies: ECHOCARDIOGRAM COMPLETE  Result Date: 12/14/2021    ECHOCARDIOGRAM REPORT   Patient Name:   Trevor Deleon. Date of Exam: 12/14/2021 Medical Rec #:  [id number]            Height:       71.0 in Accession #:    [id number]           Weight:       178.6 lb Date of Birth:  14-Jan-1932            BSA:          2.009 m Patient Age:    69 years             BP:           159/68 mmHg Patient Gender: M                    HR:           63 bpm. Exam Location:  [facility] Procedure: 2D Echo, Color Doppler and Cardiac Doppler Indications:     Chest pain R07.9  History:         Patient has prior history of Echocardiogram examinations, most                  recent 09/01/2015. Previous Myocardial Infarction; Risk                  Factors:Diabetes and Hypertension. CKD.  Sonographer:     Sherrie Sport Referring Phys:  Ducor [place] Diagnosing Phys: Yolonda Kida MD  Sonographer Comments: Suboptimal parasternal window and no subcostal window. IMPRESSIONS  1. Left ventricular ejection fraction, by estimation, is 60 to 65%. The left ventricle has normal function. The left ventricle has no regional wall motion abnormalities. Left ventricular diastolic parameters are consistent with Grade I diastolic dysfunction (impaired relaxation).  2. Right ventricular systolic function is normal. The right ventricular size is normal.  3. The mitral valve is normal in structure. No evidence of mitral valve regurgitation.  4. The aortic valve is grossly normal. Aortic valve regurgitation is not visualized. FINDINGS  Left Ventricle: Left ventricular ejection fraction, by estimation, is 60 to 65%. The left ventricle has normal function. The left ventricle has no regional wall motion abnormalities. The left ventricular internal cavity size was normal in size. There is  no left ventricular hypertrophy. Left ventricular diastolic parameters are consistent with Grade I diastolic dysfunction (impaired  relaxation). Right Ventricle: The right ventricular size is normal. No increase in right ventricular wall thickness. Right ventricular systolic function is normal. Left Atrium: Left atrial size was normal in size. Right Atrium: Right atrial  size was normal in size. Pericardium: There is no evidence of pericardial effusion. Mitral Valve: The mitral valve is normal in structure. No evidence of mitral valve regurgitation. MV peak gradient, 5.6 mmHg. The mean mitral valve gradient is 2.0 mmHg. Tricuspid Valve: The tricuspid valve is normal in structure. Tricuspid valve regurgitation is trivial. Aortic Valve: The aortic valve is grossly normal. Aortic valve regurgitation is not visualized. Aortic valve mean gradient measures 2.0 mmHg. Aortic valve peak gradient measures 3.4 mmHg. Aortic valve area, by VTI measures 3.68 cm. Pulmonic Valve: The pulmonic valve was normal in structure. Pulmonic valve regurgitation is not visualized. Aorta: The ascending aorta was not well visualized. IAS/Shunts: No atrial level shunt detected by color flow Doppler.  LEFT VENTRICLE PLAX 2D LVIDd:         3.90 cm   Diastology LVIDs:         2.40 cm   LV e' medial:    8.16 cm/s LV PW:         1.20 cm   LV E/e' medial:  4.5 LV IVS:        1.20 cm   LV e' lateral:   8.16 cm/s LVOT diam:     2.10 cm   LV E/e' lateral: 4.5 LV SV:         60 LV SV Index:   30 LVOT Area:     3.46 cm  RIGHT VENTRICLE RV S prime:     17.30 cm/s TAPSE (M-mode): 1.3 cm LEFT ATRIUM           Index        RIGHT ATRIUM           Index LA diam:      3.90 cm 1.94 cm/m   RA Area:     22.60 cm LA Vol (A2C): 25.7 ml 12.79 ml/m  RA Volume:   64.90 ml  32.30 ml/m LA Vol (A4C): 40.0 ml 19.91 ml/m  AORTIC VALVE AV Area (Vmax):    3.23 cm AV Area (Vmean):   2.99 cm AV Area (VTI):     3.68 cm AV Vmax:           91.80 cm/s AV Vmean:          65.200 cm/s AV VTI:            0.163 m AV Peak Grad:      3.4 mmHg AV Mean Grad:      2.0 mmHg LVOT Vmax:         85.50 cm/s LVOT Vmean:         56.300 cm/s LVOT VTI:          0.173 m LVOT/AV VTI ratio: 1.06  AORTA Ao Root diam: 3.83 cm MITRAL VALVE               TRICUSPID VALVE MV Area (PHT): 4.57 cm    TR Peak grad:   27.5 mmHg MV Area VTI:   3.36 cm    TR Vmax:        262.00 cm/s MV Peak grad:  5.6 mmHg MV Mean grad:  2.0 mmHg    SHUNTS MV Vmax:       1.18 m/s    Systemic VTI:  0.17 m MV Vmean:      61.9 cm/s   Systemic Diam: 2.10 cm MV Decel Time: 166 msec MV E velocity: 36.40 cm/s MV A velocity: 99.40 cm/s MV E/A ratio:  0.37 Dwayne D Callwood MD  Electronically signed by Yolonda Kida MD Signature Date/Time: 12/14/2021/1:41:46 PM    Final    CT Angio Chest/Abd/Pel for Dissection W and/or Wo Contrast  Result Date: 12/13/2021 CLINICAL DATA:  Acute aortic syndrome.  Chest pain beginning today. EXAM: CT ANGIOGRAPHY CHEST, ABDOMEN AND PELVIS TECHNIQUE: Multidetector CT imaging through the chest, abdomen and pelvis was performed using the standard protocol during bolus administration of intravenous contrast. Multiplanar reconstructed images and MIPs were obtained and reviewed to evaluate the vascular anatomy. RADIATION DOSE REDUCTION: This exam was performed according to the departmental dose-optimization program which includes automated exposure control, adjustment of the mA and/or kV according to patient size and/or use of iterative reconstruction technique. CONTRAST:  87mL OMNIPAQUE IOHEXOL 350 MG/ML SOLN COMPARISON:  Chest radiography same day. MRI 08/24/2021. CT 03/10/2021. CT 04/23/2019. FINDINGS: CTA CHEST FINDINGS Cardiovascular: The heart is at the upper limits of normal in size. Coronary artery calcification is present. Aortic atherosclerotic calcification is present. The aortic is tortuous. Maximal diameter of the ascending aorta is measured at 3.8 cm. Aortic branching pattern is normal without evidence of brachiocephalic vessel origin stenosis. No evidence of aortic dissection or acute ulceration. Mediastinum/Nodes: No mass or  adenopathy. Lungs/Pleura: No pleural effusion. No pulmonary infiltrate, collapse, mass or worrisome nodule. 4 mm subsolid nodule in the lateral right middle lobe is unchanged since June of 2020 and benign. Musculoskeletal: Old superior endplate fracture at N39 and minimal superior endplate deformity at J67 are unchanged. Review of the MIP images confirms the above findings. CTA ABDOMEN AND PELVIS FINDINGS VASCULAR Aorta: Aortic atherosclerosis. No aneurysm. No dissection. Maximal diameter 2.4 cm. Celiac: Atherosclerosis at the origin but without stenosis. SMA: Atherosclerosis at the origin.  30% stenosis. Renals: Atherosclerosis at both renal artery origins. 40% stenosis on the right. No measurable stenosis on the left. IMA: Patent.  50% stenosis at the origin. Inflow: Normal Veins: Normal Review of the MIP images confirms the above findings. NON-VASCULAR Hepatobiliary: Liver parenchyma is normal. Previous cholecystectomy. Pancreas: Normal Spleen: Normal Adrenals/Urinary Tract: Adrenal glands are normal. No hydronephrosis of the kidneys. Simple appearing cysts on the right. Some renal atrophic changes, right more than left. Bladder is normal. Stomach/Bowel: Stomach and small intestine are unremarkable. Diverticulosis of the left colon without evidence of diverticulitis Lymphatic: No lymphadenopathy. Reproductive: Enlarged prostate. Other: Left inguinal hernia containing only fat. Musculoskeletal: Chronic lumbar degenerative changes. Review of the MIP images confirms the above findings. IMPRESSION: No acute vascular finding. Coronary artery calcification. Aortic atherosclerotic calcification. Maximal diameter of the ascending aorta measured at 3.8 cm. Aortic atherosclerosis without evidence of dissection or penetrating ulcer. Abdominal aortic atherosclerosis. Maximal diameter of the infrarenal abdominal aorta is only 2.4 cm. Atherosclerosis of the aortic branch vessels, most notably 40% stenosis of the right renal  artery and 50% stenosis of the inferior mesenteric artery. No acute lung finding. Stable 4 mm nodule in the right middle lobe, as distant as 04/23/2019, therefore benign. No acute abdominal organ pathology visible. Enlargement of the prostate gland. Some atrophic changes of the kidneys. Electronically Signed   By: Nelson Chimes M.D.   On: 12/13/2021 14:27        Scheduled Meds:  aspirin  81 mg Oral Pre-Cath   [MAR Hold] aspirin EC  81 mg Oral Daily   [MAR Hold] doxazosin  8 mg Oral QHS   [START ON 12/16/2021] losartan  50 mg Oral Daily   [MAR Hold] pantoprazole  40 mg Oral Daily   [MAR Hold] simvastatin  40 mg  Oral QHS   sodium chloride flush  3 mL Intravenous Q12H   [MAR Hold] sodium chloride flush  3 mL Intravenous Q12H   [MAR Hold] sodium chloride flush  3 mL Intravenous Q12H   Continuous Infusions:  sodium chloride     sodium chloride     sodium chloride     sodium chloride 1 mL/kg/hr (12/15/21 0455)   sodium chloride     sodium chloride     heparin Stopped (12/15/21 1227)     LOS: 0 days    Time spent: 35 minutes    Sidney Ace, MD [facility]   If 7PM-7AM, please contact night-coverage  12/15/2021, 1:53 PM

## 2021-12-15 NOTE — Progress Notes (Signed)
Brief post cath note  Left heart cath right radial Indication unstable angina  Results Mildly reduced left ventricular function around 40% anterior hypokinesis  Coronary Left main 75% napkin ring mid left main moderate calcification LAD large 95% mid moderate calcification Circumflex medium minor irregularities 50% ostial RCA large tortuous mid stent widely patent distal RCA 70% TIMI-3 flow distal PL 50-75 diffuse No significant collateral  Intervention was deferred Consider referral to tertiary care center for possible CABG versus complex PCI and stent

## 2021-12-15 NOTE — Consult Note (Signed)
ANTICOAGULATION CONSULT NOTE   Pharmacy Consult for Heparin Indication: chest pain/ACS  No Known Allergies  Patient Measurements: Height: 5\' 11"  (180.3 cm) Weight: 81 kg (178 lb 9.2 oz) IBW/kg (Calculated) : 75.3 Heparin Dosing Weight: 81 kg  Vital Signs: Temp: 97.7 F (36.5 C) (01/25 0402) Temp Source: Oral (01/25 0402) BP: 147/76 (01/25 0402) Pulse Rate: 66 (01/25 0402)  Labs: Recent Labs    12/13/21 1124 12/13/21 1247 12/13/21 1640 12/13/21 1905 12/14/21 0027 12/14/21 0221 12/14/21 0221 12/14/21 0637 12/14/21 1418 12/14/21 2205 12/15/21 0611  HGB 14.7  --  14.2  --   --  13.7  --   --   --   --   --   HCT 41.8  --  38.8*  --   --  37.7*  --   --   --   --   --   PLT 90*  --  98*  --   --  98*  --   --   --   --   --   APTT  --   --   --  39*  --   --   --   --   --   --   --   LABPROT  --   --   --  13.8  --   --   --   --   --   --   --   INR  --   --   --  1.1  --   --   --   --   --   --   --   HEPARINUNFRC  --   --   --   --   --  >1.10*   < >  --  0.66 0.65 0.72*  CREATININE 1.56*  --  1.42*  --   --  1.41*  --   --   --   --  1.57*  TROPONINIHS 32*   < > 87*  --  66*  --   --  50*  --   --   --    < > = values in this interval not displayed.     Estimated Creatinine Clearance: 35.3 mL/min (A) (by C-G formula based on SCr of 1.57 mg/dL (H)).   Medical History: Past Medical History:  Diagnosis Date   Arthritis    BPH (benign prostatic hyperplasia)    CKD (chronic kidney disease) stage 3, GFR 30-59 ml/min (HCC)    Coronary artery disease    Diabetes mellitus without complication ([place])    Pt does not take medications.   Duodenum disorder    BRUNNERS GLAND   ED (erectile dysfunction)    GERD (gastroesophageal reflux disease)    Hernia, femoral    History of hiatal hernia    Hyperlipidemia    Hypertension    Hypogonadism in male    Labyrinthitis    Myocardial infarction ([place])    1999   Pancytopenia ([place])    Stricture esophagus      Medications:  No history of chronic anticoagulant use.  Assessment: Pharmacy has been consulted to initiate heparin in 89yo patient presenting to the ED with chest pain. Troponin levels of 32, 54, and 87 respectively. Baseline labs have been ordered and pending.  Goal of Therapy:  Heparin level 0.3-0.7 units/ml Monitor platelets by anticoagulation protocol: Yes  1/24 0221 HL >1.1, supratherapeutic 1/24 1418 HL 0.66.  1/24 2205 HL 0.65, therapeutic x 2 1/25 6962  HL 0.72, supratherapeutic   Plan:  Decrease heparin infusion to 700 units/hr.  Recheck Heparin in 8 hrs after rate change.  CBC daily while on heparin .   Renda Rolls, PharmD, [facility] 12/15/2021 7:13 AM

## 2021-12-16 ENCOUNTER — Encounter: Payer: Self-pay | Admitting: Internal Medicine

## 2021-12-16 LAB — BASIC METABOLIC PANEL
Anion gap: 9 (ref 5–15)
BUN: 21 mg/dL (ref 8–23)
CO2: 23 mmol/L (ref 22–32)
Calcium: 9.2 mg/dL (ref 8.9–10.3)
Chloride: 106 mmol/L (ref 98–111)
Creatinine, Ser: 1.27 mg/dL — ABNORMAL HIGH (ref 0.61–1.24)
GFR, Estimated: 55 mL/min — ABNORMAL LOW (ref >60)
Glucose, Bld: 114 mg/dL — ABNORMAL HIGH (ref 70–99)
Potassium: 3.6 mmol/L (ref 3.5–5.1)
Sodium: 138 mmol/L (ref 135–145)

## 2021-12-16 LAB — CBC
HCT: 36.3 % — ABNORMAL LOW (ref 39.0–52.0)
Hemoglobin: 13 g/dL (ref 13.0–17.0)
MCH: 30.5 pg (ref 26.0–34.0)
MCHC: 35.8 g/dL (ref 30.0–36.0)
MCV: 85.2 fL (ref 80.0–100.0)
Platelets: 91 10*3/uL — ABNORMAL LOW (ref 150–400)
RBC: 4.26 MIL/uL (ref 4.22–5.81)
RDW: 12.8 % (ref 11.5–15.5)
WBC: 3.9 10*3/uL — ABNORMAL LOW (ref 4.0–10.5)
nRBC: 0 % (ref 0.0–0.2)

## 2021-12-16 LAB — CARDIAC CATHETERIZATION: Cath EF Quantitative: 45 %

## 2021-12-16 LAB — MAGNESIUM: Magnesium: 2 mg/dL (ref 1.7–2.4)

## 2021-12-16 MED ORDER — CLOPIDOGREL BISULFATE 75 MG PO TABS: 75.0000 mg | ORAL_TABLET | Freq: Every day | ORAL | Status: DC

## 2021-12-16 MED ORDER — CLOPIDOGREL BISULFATE 75 MG PO TABS
300.0000 mg | ORAL_TABLET | Freq: Once | ORAL | Status: AC
  Administered 2021-12-16: 300 mg via ORAL
  Filled 2021-12-16: qty 4

## 2021-12-16 MED ORDER — HEPARIN SODIUM (PORCINE) 5000 UNIT/ML IJ SOLN
5000.0000 [IU] | Freq: Three times a day (TID) | INTRAMUSCULAR | Status: DC
  Administered 2021-12-16: 5000 [IU] via SUBCUTANEOUS
  Filled 2021-12-16: qty 1

## 2021-12-16 MED ORDER — SODIUM CHLORIDE 0.9 % IV SOLN: 250.0000 mL | INTRAVENOUS | Status: DC | PRN

## 2021-12-16 MED ORDER — ISOSORBIDE MONONITRATE ER 30 MG PO TB24: 30.0000 mg | ORAL_TABLET | Freq: Every day | ORAL | Status: DC

## 2021-12-16 MED ORDER — HYDRALAZINE HCL 20 MG/ML IJ SOLN
10.0000 mg | INTRAMUSCULAR | Status: DC | PRN
  Administered 2021-12-16: 10 mg via INTRAVENOUS
  Filled 2021-12-16: qty 1

## 2021-12-16 MED ORDER — HYDRALAZINE HCL 25 MG PO TABS: 25.0000 mg | ORAL_TABLET | Freq: Three times a day (TID) | ORAL | Status: DC

## 2021-12-16 MED ORDER — METOPROLOL TARTRATE 25 MG PO TABS: 25.0000 mg | ORAL_TABLET | Freq: Two times a day (BID) | ORAL | Status: DC

## 2021-12-16 MED ORDER — ROSUVASTATIN CALCIUM 10 MG PO TABS: 20.0000 mg | ORAL_TABLET | Freq: Every day | ORAL | Status: DC

## 2021-12-16 MED ORDER — CLOPIDOGREL BISULFATE 75 MG PO TABS
225.0000 mg | ORAL_TABLET | Freq: Once | ORAL | Status: DC
  Filled 2021-12-16: qty 3

## 2021-12-16 NOTE — Progress Notes (Signed)
CARDIOLOGY CONSULT NOTE            Patient ID: [name]. MRN: [id number] DOB/AGE: 86/03/1932 86 y.o.  Admit date: 12/13/2021 Referring Physician Dr. Si Raider Primary Physician Ezequiel Kayser Primary Cardiologist Dr. Ubaldo Glassing Reason for Consultation chest pain  HPI: The patient is an 88 year old male with a past medical history significant for CAD s/p MI 1999 with 70% mid RCA, 50% distal LAD, 50% ramus, 70% proximal circumflex 100% OM2, CKD, hyperlipidemia, hypertension, type 2 diabetes, thrombocytopenia, BPH who presented to [facility] ED 12/13/2021 with acute onset of chest pain when he woke up yesterday morning that was relieved with SL nitro.  Cardiology is consulted for further investigation of his chest pain and elevated troponin.  Interval history: -cath performed yesterday by Dr. Clayborn Bigness revealed LMCA 75% napkin ring and mid LM mod calcs, LAD 95%, ostial 50%, and RCA with widely patent stent and distal 70% disease -- referral to tertiary care center for possible CABG or complex PCI and stent recommended -denies further angina, sob, palpitations, dizziness. Says he feels much better than when he came in.    Review of systems complete and found to be negative unless listed above    Past Medical History:  Diagnosis Date   Arthritis    BPH (benign prostatic hyperplasia)    CKD (chronic kidney disease) stage 3, GFR 30-59 ml/min (HCC)    Coronary artery disease    Diabetes mellitus without complication (HCC)    Pt does not take medications.   Duodenum disorder    BRUNNERS GLAND   ED (erectile dysfunction)    GERD (gastroesophageal reflux disease)    Hernia, femoral    History of hiatal hernia    Hyperlipidemia    Hypertension    Hypogonadism in male    Labyrinthitis    Myocardial infarction ([place])    1999   Pancytopenia ([place])    Stricture esophagus     Past Surgical History:  Procedure Laterality Date   APPENDECTOMY     CARDIAC CATHETERIZATION     CARDIAC SURGERY      CHOLECYSTECTOMY N/A 09/02/2015   Procedure: LAPAROSCOPIC CHOLECYSTECTOMY;  Surgeon: Leonie Green, MD;  Location: [facility] ORS;  Service: General;  Laterality: N/A;   COLONOSCOPY     CORONARY ANGIOPLASTY     ERCP Left 09/03/2015   Procedure: ENDOSCOPIC RETROGRADE CHOLANGIOPANCREATOGRAPHY (ERCP);  Surgeon: Hulen Luster, MD;  Location: [facility] ENDOSCOPY;  Service: Endoscopy;  Laterality: Left;   ESOPHAGOGASTRODUODENOSCOPY (EGD) WITH PROPOFOL N/A 08/29/2017   Procedure: ESOPHAGOGASTRODUODENOSCOPY (EGD) WITH PROPOFOL;  Surgeon: Toledo, Benay Pike, MD;  Location: [facility] ENDOSCOPY;  Service: Gastroenterology;  Laterality: N/A;   FLEXIBLE SIGMOIDOSCOPY     HERNIA REPAIR     INGUINAL HERNIA REPAIR Left 12/15/2015   Procedure: HERNIA REPAIR INGUINAL ADULT;  Surgeon: Leonie Green, MD;  Location: [facility] ORS;  Service: General;  Laterality: Left;   INTRAOPERATIVE CHOLANGIOGRAM  09/02/2015   Procedure: INTRAOPERATIVE CHOLANGIOGRAM;  Surgeon: Leonie Green, MD;  Location: [facility] ORS;  Service: General;;   JOINT REPLACEMENT Left    LIPOMA EXCISION     PROSTATE SURGERY  12/17/2014   TONSILLECTOMY      Medications Prior to Admission  Medication Sig Dispense Refill Last Dose   acetaminophen (TYLENOL) 650 MG CR tablet Take 650 mg by mouth every 6 (six) hours as needed for pain.   12/12/2021 at PRN   aspirin EC 81 MG tablet Take 81 mg by mouth daily.   12/13/2021 at  0930   docusate sodium (COLACE) 100 MG capsule Take 100 mg by mouth 2 (two) times daily.   12/12/2021 at AM   doxazosin (CARDURA) 8 MG tablet Take 8 mg by mouth daily.   12/12/2021 at 2200   losartan (COZAAR) 25 MG tablet Take 25 mg by mouth 2 (two) times daily.   12/12/2021 at 2200   nitroGLYCERIN (NITROSTAT) 0.4 MG SL tablet Place 0.4 mg under the tongue. UP TO 3 DOSES   12/13/2021 at 0900   Omega-3 Fatty Acids (FISH OIL) 1000 MG CAPS Take 3,000 mg by mouth 2 (two) times daily.   12/12/2021 at AM   pantoprazole (PROTONIX) 40 MG tablet Take 40 mg by  mouth daily.   12/13/2021 at PRN   Polyethylene Glycol 3350 (MIRALAX PO) Take 17 g by mouth daily.    12/12/2021 at NOON   potassium chloride (KLOR-CON M) 10 MEQ tablet Take 30 mEq by mouth once.   Past Week   Psyllium (METAMUCIL PO) Take 1 Dose by mouth daily. Reported on 12/15/2015   12/12/2021 at NOON   simvastatin (ZOCOR) 40 MG tablet Take 40 mg by mouth at bedtime.    12/12/2021 at 2200   ALPRAZolam (XANAX) 0.25 MG tablet Take 1 tablet by mouth at bedtime as needed for sleep.   3 PRN at PRN   nystatin-triamcinolone ointment (MYCOLOG) Apply 1 application topically 2 (two) times daily. 30 g 3 PRN at PRN   tamsulosin (FLOMAX) 0.4 MG CAPS capsule Take 0.4 mg by mouth at bedtime. (Patient not taking: Reported on 12/13/2021)   Not Taking    Social History   Socioeconomic History   Marital status: Married    Spouse name: Not on file   Number of children: Not on file   Years of education: Not on file   Highest education level: Not on file  Occupational History   Occupation: retired  Tobacco Use   Smoking status: Former    Packs/day: 1.00    Years: 15.00    Pack years: 15.00    Types: Cigarettes   Smokeless tobacco: Never  Vaping Use   Vaping Use: Never used  Substance and Sexual Activity   Alcohol use: No   Drug use: No   Sexual activity: Yes    Partners: Female    Birth control/protection: None  Other Topics Concern   Not on file  Social History Narrative   Not on file   Social Determinants of Health   Financial Resource Strain: Not on file  Food Insecurity: Not on file  Transportation Needs: Not on file  Physical Activity: Not on file  Stress: Not on file  Social Connections: Not on file  Intimate Partner Violence: Not on file    Family History  Problem Relation Age of Onset   Hypertension Mother    Stroke Mother    Prostate cancer Father    Heart attack Father       Review of systems complete and found to be negative unless listed above    PHYSICAL  EXAM General: Elderly Caucasian male, well nourished, in no acute distress. Laying comfortably in PCU bed with wife and daughter at bedside.  HEENT:  Normocephalic and atraumatic. Neck:  No JVD.  Lungs: Normal respiratory effort on room air. Clear bilaterally to auscultation. No wheezes, crackles, rhonchi.  Heart: HRRR . Normal S1 and S2 without gallops or murmurs. Radial & DP pulses 2+ bilaterally. Abdomen: Non-distended appearing.  Msk: Normal strength and tone for age.  Extremities: No clubbing, cyanosis or edema.  R wrist with gauze and tegaderm without tenderness or erythema.  Neuro: Alert and oriented X 3. Psych:  Mood appropriate, affect congruent.   Labs:   Lab Results  Component Value Date   WBC 3.9 (L) 12/16/2021   HGB 13.0 12/16/2021   HCT 36.3 (L) 12/16/2021   MCV 85.2 12/16/2021   PLT 91 (L) 12/16/2021    Recent Labs  Lab 12/15/21 0611  NA 137  K 3.5  CL 104  CO2 24  BUN 28*  CREATININE 1.57*  CALCIUM 8.9  GLUCOSE 119*    Lab Results  Component Value Date   TROPONINI <0.03 08/29/2015    No results found for: CHOL No results found for: HDL No results found for: LDLCALC No results found for: TRIG No results found for: CHOLHDL No results found for: LDLDIRECT    Radiology: DG Chest 2 View  Result Date: 12/13/2021 CLINICAL DATA:  Chest pain EXAM: CHEST - 2 VIEW COMPARISON:  08/29/2015 FINDINGS: Heart size within normal limits. Aortic atherosclerosis. No focal airspace consolidation, pleural effusion, or pneumothorax. Chronic mild compression deformity of T11. IMPRESSION: No active cardiopulmonary disease. Electronically Signed   By: Davina Poke D.O.   On: 12/13/2021 11:56   CARDIAC CATHETERIZATION  Result Date: 12/16/2021   Mid LM lesion is 50% stenosed.   Mid LAD lesion is 95% stenosed.   Mid RCA-1 lesion is 20% stenosed.   Mid RCA-2 lesion is 50% stenosed.   RPAV-1 lesion is 50% stenosed.   Prox RCA lesion is 25% stenosed.   Dist RCA lesion is 50%  stenosed with 50% stenosed side branch in RPDA.   RPAV-2 lesion is 50% stenosed with 50% stenosed side branch in 3rd RPL.   Ost Cx to Dist Cx lesion is 50% stenosed.   There is mild left ventricular systolic dysfunction.   LV end diastolic pressure is mildly elevated.   The left ventricular ejection fraction is 45-50% by visual estimate.   There is no mitral valve regurgitation. Conclusion Mildly reduced left ventricular function with anterior apical hypokinesis ejection fraction around 45%. Coronaries Left main possible with napkin ring 50% mid lesion LAD large 95% mid lesion Circumflex medium in size diffuse 50% throughout RCA large mid stent widely patent distal RCA about 50% TIMI-3 flow Intervention deferred Case should be referred to tertiary care center for evaluation of left main and mid LAD Evaluate for possible complex PCI versus CABG because of left main Case will be discussed with Dr. Ronnald Ramp at [facility] Consider transfer to [facility] for further management and treatment   ECHOCARDIOGRAM COMPLETE  Result Date: 12/14/2021    ECHOCARDIOGRAM REPORT   Patient Name:   Harles Evetts. Date of Exam: 12/14/2021 Medical Rec #:  [id number]            Height:       71.0 in Accession #:    [id number]           Weight:       178.6 lb Date of Birth:  [birth year]            BSA:          2.009 m Patient Age:    37 years             BP:           159/68 mmHg Patient Gender: M  HR:           63 bpm. Exam Location:  [facility] Procedure: 2D Echo, Color Doppler and Cardiac Doppler Indications:     Chest pain R07.9  History:         Patient has prior history of Echocardiogram examinations, most                  recent 09/01/2015. Previous Myocardial Infarction; Risk                  Factors:Diabetes and Hypertension. CKD.  Sonographer:     Sherrie Sport Referring Phys:  Harmon Easton Diagnosing Phys: Yolonda Kida MD  Sonographer Comments: Suboptimal parasternal window and no subcostal window. IMPRESSIONS   1. Left ventricular ejection fraction, by estimation, is 60 to 65%. The left ventricle has normal function. The left ventricle has no regional wall motion abnormalities. Left ventricular diastolic parameters are consistent with Grade I diastolic dysfunction (impaired relaxation).  2. Right ventricular systolic function is normal. The right ventricular size is normal.  3. The mitral valve is normal in structure. No evidence of mitral valve regurgitation.  4. The aortic valve is grossly normal. Aortic valve regurgitation is not visualized. FINDINGS  Left Ventricle: Left ventricular ejection fraction, by estimation, is 60 to 65%. The left ventricle has normal function. The left ventricle has no regional wall motion abnormalities. The left ventricular internal cavity size was normal in size. There is  no left ventricular hypertrophy. Left ventricular diastolic parameters are consistent with Grade I diastolic dysfunction (impaired relaxation). Right Ventricle: The right ventricular size is normal. No increase in right ventricular wall thickness. Right ventricular systolic function is normal. Left Atrium: Left atrial size was normal in size. Right Atrium: Right atrial size was normal in size. Pericardium: There is no evidence of pericardial effusion. Mitral Valve: The mitral valve is normal in structure. No evidence of mitral valve regurgitation. MV peak gradient, 5.6 mmHg. The mean mitral valve gradient is 2.0 mmHg. Tricuspid Valve: The tricuspid valve is normal in structure. Tricuspid valve regurgitation is trivial. Aortic Valve: The aortic valve is grossly normal. Aortic valve regurgitation is not visualized. Aortic valve mean gradient measures 2.0 mmHg. Aortic valve peak gradient measures 3.4 mmHg. Aortic valve area, by VTI measures 3.68 cm. Pulmonic Valve: The pulmonic valve was normal in structure. Pulmonic valve regurgitation is not visualized. Aorta: The ascending aorta was not well visualized. IAS/Shunts: No  atrial level shunt detected by color flow Doppler.  LEFT VENTRICLE PLAX 2D LVIDd:         3.90 cm   Diastology LVIDs:         2.40 cm   LV e' medial:    8.16 cm/s LV PW:         1.20 cm   LV E/e' medial:  4.5 LV IVS:        1.20 cm   LV e' lateral:   8.16 cm/s LVOT diam:     2.10 cm   LV E/e' lateral: 4.5 LV SV:         60 LV SV Index:   30 LVOT Area:     3.46 cm  RIGHT VENTRICLE RV S prime:     17.30 cm/s TAPSE (M-mode): 1.3 cm LEFT ATRIUM           Index        RIGHT ATRIUM           Index LA diam:  3.90 cm 1.94 cm/m   RA Area:     22.60 cm LA Vol (A2C): 25.7 ml 12.79 ml/m  RA Volume:   64.90 ml  32.30 ml/m LA Vol (A4C): 40.0 ml 19.91 ml/m  AORTIC VALVE AV Area (Vmax):    3.23 cm AV Area (Vmean):   2.99 cm AV Area (VTI):     3.68 cm AV Vmax:           91.80 cm/s AV Vmean:          65.200 cm/s AV VTI:            0.163 m AV Peak Grad:      3.4 mmHg AV Mean Grad:      2.0 mmHg LVOT Vmax:         85.50 cm/s LVOT Vmean:        56.300 cm/s LVOT VTI:          0.173 m LVOT/AV VTI ratio: 1.06  AORTA Ao Root diam: 3.83 cm MITRAL VALVE               TRICUSPID VALVE MV Area (PHT): 4.57 cm    TR Peak grad:   27.5 mmHg MV Area VTI:   3.36 cm    TR Vmax:        262.00 cm/s MV Peak grad:  5.6 mmHg MV Mean grad:  2.0 mmHg    SHUNTS MV Vmax:       1.18 m/s    Systemic VTI:  0.17 m MV Vmean:      61.9 cm/s   Systemic Diam: 2.10 cm MV Decel Time: 166 msec MV E velocity: 36.40 cm/s MV A velocity: 99.40 cm/s MV E/A ratio:  0.37 Dwayne D Callwood MD Electronically signed by Yolonda Kida MD Signature Date/Time: 12/14/2021/1:41:46 PM    Final    CT Angio Chest/Abd/Pel for Dissection W and/or Wo Contrast  Result Date: 12/13/2021 CLINICAL DATA:  Acute aortic syndrome.  Chest pain beginning today. EXAM: CT ANGIOGRAPHY CHEST, ABDOMEN AND PELVIS TECHNIQUE: Multidetector CT imaging through the chest, abdomen and pelvis was performed using the standard protocol during bolus administration of intravenous contrast.  Multiplanar reconstructed images and MIPs were obtained and reviewed to evaluate the vascular anatomy. RADIATION DOSE REDUCTION: This exam was performed according to the departmental dose-optimization program which includes automated exposure control, adjustment of the mA and/or kV according to patient size and/or use of iterative reconstruction technique. CONTRAST:  25m OMNIPAQUE IOHEXOL 350 MG/ML SOLN COMPARISON:  Chest radiography same day. MRI 08/24/2021. CT 03/10/2021. CT 04/23/2019. FINDINGS: CTA CHEST FINDINGS Cardiovascular: The heart is at the upper limits of normal in size. Coronary artery calcification is present. Aortic atherosclerotic calcification is present. The aortic is tortuous. Maximal diameter of the ascending aorta is measured at 3.8 cm. Aortic branching pattern is normal without evidence of brachiocephalic vessel origin stenosis. No evidence of aortic dissection or acute ulceration. Mediastinum/Nodes: No mass or adenopathy. Lungs/Pleura: No pleural effusion. No pulmonary infiltrate, collapse, mass or worrisome nodule. 4 mm subsolid nodule in the lateral right middle lobe is unchanged since June of 2020 and benign. Musculoskeletal: Old superior endplate fracture at TJ57and minimal superior endplate deformity at TS17are unchanged. Review of the MIP images confirms the above findings. CTA ABDOMEN AND PELVIS FINDINGS VASCULAR Aorta: Aortic atherosclerosis. No aneurysm. No dissection. Maximal diameter 2.4 cm. Celiac: Atherosclerosis at the origin but without stenosis. SMA: Atherosclerosis at the origin.  30% stenosis. Renals: Atherosclerosis at both renal  artery origins. 40% stenosis on the right. No measurable stenosis on the left. IMA: Patent.  50% stenosis at the origin. Inflow: Normal Veins: Normal Review of the MIP images confirms the above findings. NON-VASCULAR Hepatobiliary: Liver parenchyma is normal. Previous cholecystectomy. Pancreas: Normal Spleen: Normal Adrenals/Urinary Tract: Adrenal  glands are normal. No hydronephrosis of the kidneys. Simple appearing cysts on the right. Some renal atrophic changes, right more than left. Bladder is normal. Stomach/Bowel: Stomach and small intestine are unremarkable. Diverticulosis of the left colon without evidence of diverticulitis Lymphatic: No lymphadenopathy. Reproductive: Enlarged prostate. Other: Left inguinal hernia containing only fat. Musculoskeletal: Chronic lumbar degenerative changes. Review of the MIP images confirms the above findings. IMPRESSION: No acute vascular finding. Coronary artery calcification. Aortic atherosclerotic calcification. Maximal diameter of the ascending aorta measured at 3.8 cm. Aortic atherosclerosis without evidence of dissection or penetrating ulcer. Abdominal aortic atherosclerosis. Maximal diameter of the infrarenal abdominal aorta is only 2.4 cm. Atherosclerosis of the aortic branch vessels, most notably 40% stenosis of the right renal artery and 50% stenosis of the inferior mesenteric artery. No acute lung finding. Stable 4 mm nodule in the right middle lobe, as distant as 04/23/2019, therefore benign. No acute abdominal organ pathology visible. Enlargement of the prostate gland. Some atrophic changes of the kidneys. Electronically Signed   By: Nelson Chimes M.D.   On: 12/13/2021 14:27    ECHO LVEF from 2021 Lexiscan stress test >60%  TELEMETRY reviewed by me: Sinus rhythm 102  EKG reviewed by me: Normal sinus rhythm rate 88 with PVCs  ASSESSMENT AND PLAN:  The patient is an 88 year old male with a past medical history significant for CAD s/p MI 1999 s/p DES to RCA - 70% mid RCA, 50% distal LAD, 50% ramus, 70% proximal circumflex 100% OM2, CKD, hyperlipidemia, hypertension, type 2 diabetes, thrombocytopenia, BPH who presented to [facility] ED 12/13/2021 with acute onset of chest pain when he woke up yesterday morning that was relieved with SL nitro.  Cardiology is consulted for further investigation of his chest pain  and elevated troponin.  #atypical chest pain #Elevated troponin  #CAD s/p MI 1999 with DES to RCA and residual disease The patient presented with 3/10 substernal chest pain that has been intermittent for the past few months, but this episode but lasted a bit longer. He states it is sometimes relieved with protonix and sometimes with SL nitro. His EKG is without acute ischemic changes. troponins mildly elevated and trended 32-54-87-66-50 in the setting of probable residual CAD and increased pain at rest and with minimal activity. -S/p aspirin 314m at home yesterday, continue daily baby aspirin with close monitoring of his platelets -continue heparin for 48 hours.  -Monitor on telemetry while inpatient -s/p LHC with Dr. CClayborn Bigness1/25 - recommendation is for transfer to tertiary center for complex PCI or CABG. Dr. CClayborn Bignessspoke with Dr. SBaron Hamperwho is accepting the patient once there is a bed available.   #Hypertension #Hyperlipidemia Continue tamsulosin and doxazosin 877m   Continue losartan and uptitrate dose to 501mor better BP control  -continue simvastatin 50m61mCKD -creatinine of 1.41, EGFR 48, slightly up from his baseline in 09/2021 at 1.2 and 57  This patient's plan of care was discussed and created with Dr. DwayLujean Amel he is in agreement.  Signed: LilyTristan SchroederA-C 12/16/2021, 8:55 AM

## 2021-12-16 NOTE — Discharge Summary (Signed)
Physician Discharge Summary  Trevor Deleon. IOE:[id number] DOB: [birth year] DOA: 12/13/2021  PCP: Latanya Maudlin, NP  Admit date: 12/13/2021 Discharge date: 12/16/2021  Admitted From: Home Disposition:  [place]  Recommendations for Outpatient Follow-up:     Home Health:No Equipment/Devices:None   Discharge Condition:Stabl  CODE STATUS:DNR  Diet recommendation: Cardiac  Brief/Interim Summary: 88 y.o. male with medical history significant for CAD, ckd3b, htn, dm, pancytopenia, who presents with the above.   Has had intermittent substernal mild chest pain last several months. Not exertional, doesn't radiate. Woke up with 2/10 pain today. Not pleuritic. No n/v/d. Has appetite. No fevers. No cough. Not exertional. Called EMS and they instructed him to take 4 baby aspirins which he did.   Cardiology consulted.  Status post cardiac catheterization 1/25.  Significant multivessel disease including left main, LAD, circumflex.  No significant collateralization.  plan to refer to tertiary care center for CABG versus high risk PCI      Discharge Diagnoses:  Principal Problem:   Chest pain Active Problems:   Benign prostatic hyperplasia with nocturia   Chronic renal insufficiency, stage III (moderate) (HCC)   Pancytopenia (HCC)   Type 2 diabetes mellitus with stage 3 chronic kidney disease, without long-term current use of insulin (HCC)   Unstable angina (HCC)  Chest pain, concern for nstemi CAD Atypical chest pain but does have multiple risk factors, most recent nuclear stress in 2021 with mild non-reversible defect. CTA w/o signs acute process. EKG non-ischemic. GERD in ddx. Trops did rise 32>54>87. Cardiology consulted by EDP Plan: completed 48-hour heparin gtt. course Continue home aspirin and statin BB, ACEi, nitrate added   # Thrombocytopenia Chronic. Unclear if previously worked up - likely further outpt w/u   # T2DM Diet controlled, here glucose wnl - monitor  glucose daily   #AKI on CKD stage IIIb Worsening creatinine noted 1/25, improved 1/26 -Daily creatinine  # BPH - home flomax   # GAD - home alpraz prn   # HTN -Metop, nitrate -Continue home doxazosin    Discharge Instructions  Discharge Instructions     Diet - low sodium heart healthy   Complete by: As directed    Increase activity slowly   Complete by: As directed       Allergies as of 12/16/2021   No Known Allergies      Medication List     STOP taking these medications    tamsulosin 0.4 MG Caps capsule Commonly known as: FLOMAX       TAKE these medications    acetaminophen 650 MG CR tablet Commonly known as: TYLENOL Take 650 mg by mouth every 6 (six) hours as needed for pain.   ALPRAZolam 0.25 MG tablet Commonly known as: XANAX Take 1 tablet by mouth at bedtime as needed for sleep.   aspirin EC 81 MG tablet Take 81 mg by mouth daily.   clopidogrel 75 MG tablet Commonly known as: PLAVIX Take 1 tablet (75 mg total) by mouth daily. Start taking on: December 17, 2021   docusate sodium 100 MG capsule Commonly known as: COLACE Take 100 mg by mouth 2 (two) times daily.   doxazosin 8 MG tablet Commonly known as: CARDURA Take 8 mg by mouth daily.   Fish Oil 1000 MG Caps Take 3,000 mg by mouth 2 (two) times daily.   isosorbide mononitrate 30 MG 24 hr tablet Commonly known as: IMDUR Take 1 tablet (30 mg total) by mouth daily.   losartan 25 MG tablet  Commonly known as: COZAAR Take 25 mg by mouth 2 (two) times daily.   METAMUCIL PO Take 1 Dose by mouth daily. Reported on 12/15/2015   metoprolol tartrate 25 MG tablet Commonly known as: LOPRESSOR Take 1 tablet (25 mg total) by mouth 2 (two) times daily.   MIRALAX PO Take 17 g by mouth daily.   nitroGLYCERIN 0.4 MG SL tablet Commonly known as: NITROSTAT Place 0.4 mg under the tongue. UP TO 3 DOSES   nystatin-triamcinolone ointment Commonly known as: MYCOLOG Apply 1 application topically  2 (two) times daily.   pantoprazole 40 MG tablet Commonly known as: PROTONIX Take 40 mg by mouth daily.   potassium chloride 10 MEQ tablet Commonly known as: KLOR-CON M Take 30 mEq by mouth once.   simvastatin 40 MG tablet Commonly known as: ZOCOR Take 40 mg by mouth at bedtime.        No Known Allergies  Consultations: Cardiology   Procedures/Studies: DG Chest 2 View  Result Date: 12/13/2021 CLINICAL DATA:  Chest pain EXAM: CHEST - 2 VIEW COMPARISON:  08/29/2015 FINDINGS: Heart size within normal limits. Aortic atherosclerosis. No focal airspace consolidation, pleural effusion, or pneumothorax. Chronic mild compression deformity of T11. IMPRESSION: No active cardiopulmonary disease. Electronically Signed   By: Davina Poke D.O.   On: 12/13/2021 11:56   CARDIAC CATHETERIZATION  Result Date: 12/16/2021   Mid LM lesion is 50% stenosed.   Mid LAD lesion is 95% stenosed.   Mid RCA-1 lesion is 20% stenosed.   Mid RCA-2 lesion is 50% stenosed.   RPAV-1 lesion is 50% stenosed.   Prox RCA lesion is 25% stenosed.   Dist RCA lesion is 50% stenosed with 50% stenosed side branch in RPDA.   RPAV-2 lesion is 50% stenosed with 50% stenosed side branch in 3rd RPL.   Ost Cx to Dist Cx lesion is 50% stenosed.   There is mild left ventricular systolic dysfunction.   LV end diastolic pressure is mildly elevated.   The left ventricular ejection fraction is 45-50% by visual estimate.   There is no mitral valve regurgitation. Conclusion Mildly reduced left ventricular function with anterior apical hypokinesis ejection fraction around 45%. Coronaries Left main possible with napkin ring 50% mid lesion LAD large 95% mid lesion Circumflex medium in size diffuse 50% throughout RCA large mid stent widely patent distal RCA about 50% TIMI-3 flow Intervention deferred Case should be referred to tertiary care center for evaluation of left main and mid LAD Evaluate for possible complex PCI versus CABG because of  left main Case will be discussed with Dr. Ronnald Ramp at [facility] Consider transfer to [facility] for further management and treatment   ECHOCARDIOGRAM COMPLETE  Result Date: 12/14/2021    ECHOCARDIOGRAM REPORT   Patient Name:   Trevor Deleon. Date of Exam: 12/14/2021 Medical Rec #:  [id number]            Height:       71.0 in Accession #:    [id number]           Weight:       178.6 lb Date of Birth:  1932-03-03            BSA:          2.009 m Patient Age:    32 years             BP:           159/68 mmHg Patient Gender: M  HR:           63 bpm. Exam Location:  [facility] Procedure: 2D Echo, Color Doppler and Cardiac Doppler Indications:     Chest pain R07.9  History:         Patient has prior history of Echocardiogram examinations, most                  recent 09/01/2015. Previous Myocardial Infarction; Risk                  Factors:Diabetes and Hypertension. CKD.  Sonographer:     Sherrie Sport Referring Phys:  Weldon Atka Diagnosing Phys: Yolonda Kida MD  Sonographer Comments: Suboptimal parasternal window and no subcostal window. IMPRESSIONS  1. Left ventricular ejection fraction, by estimation, is 60 to 65%. The left ventricle has normal function. The left ventricle has no regional wall motion abnormalities. Left ventricular diastolic parameters are consistent with Grade I diastolic dysfunction (impaired relaxation).  2. Right ventricular systolic function is normal. The right ventricular size is normal.  3. The mitral valve is normal in structure. No evidence of mitral valve regurgitation.  4. The aortic valve is grossly normal. Aortic valve regurgitation is not visualized. FINDINGS  Left Ventricle: Left ventricular ejection fraction, by estimation, is 60 to 65%. The left ventricle has normal function. The left ventricle has no regional wall motion abnormalities. The left ventricular internal cavity size was normal in size. There is  no left ventricular hypertrophy. Left ventricular  diastolic parameters are consistent with Grade I diastolic dysfunction (impaired relaxation). Right Ventricle: The right ventricular size is normal. No increase in right ventricular wall thickness. Right ventricular systolic function is normal. Left Atrium: Left atrial size was normal in size. Right Atrium: Right atrial size was normal in size. Pericardium: There is no evidence of pericardial effusion. Mitral Valve: The mitral valve is normal in structure. No evidence of mitral valve regurgitation. MV peak gradient, 5.6 mmHg. The mean mitral valve gradient is 2.0 mmHg. Tricuspid Valve: The tricuspid valve is normal in structure. Tricuspid valve regurgitation is trivial. Aortic Valve: The aortic valve is grossly normal. Aortic valve regurgitation is not visualized. Aortic valve mean gradient measures 2.0 mmHg. Aortic valve peak gradient measures 3.4 mmHg. Aortic valve area, by VTI measures 3.68 cm. Pulmonic Valve: The pulmonic valve was normal in structure. Pulmonic valve regurgitation is not visualized. Aorta: The ascending aorta was not well visualized. IAS/Shunts: No atrial level shunt detected by color flow Doppler.  LEFT VENTRICLE PLAX 2D LVIDd:         3.90 cm   Diastology LVIDs:         2.40 cm   LV e' medial:    8.16 cm/s LV PW:         1.20 cm   LV E/e' medial:  4.5 LV IVS:        1.20 cm   LV e' lateral:   8.16 cm/s LVOT diam:     2.10 cm   LV E/e' lateral: 4.5 LV SV:         60 LV SV Index:   30 LVOT Area:     3.46 cm  RIGHT VENTRICLE RV S prime:     17.30 cm/s TAPSE (M-mode): 1.3 cm LEFT ATRIUM           Index        RIGHT ATRIUM           Index LA diam:  3.90 cm 1.94 cm/m   RA Area:     22.60 cm LA Vol (A2C): 25.7 ml 12.79 ml/m  RA Volume:   64.90 ml  32.30 ml/m LA Vol (A4C): 40.0 ml 19.91 ml/m  AORTIC VALVE AV Area (Vmax):    3.23 cm AV Area (Vmean):   2.99 cm AV Area (VTI):     3.68 cm AV Vmax:           91.80 cm/s AV Vmean:          65.200 cm/s AV VTI:            0.163 m AV Peak Grad:       3.4 mmHg AV Mean Grad:      2.0 mmHg LVOT Vmax:         85.50 cm/s LVOT Vmean:        56.300 cm/s LVOT VTI:          0.173 m LVOT/AV VTI ratio: 1.06  AORTA Ao Root diam: 3.83 cm MITRAL VALVE               TRICUSPID VALVE MV Area (PHT): 4.57 cm    TR Peak grad:   27.5 mmHg MV Area VTI:   3.36 cm    TR Vmax:        262.00 cm/s MV Peak grad:  5.6 mmHg MV Mean grad:  2.0 mmHg    SHUNTS MV Vmax:       1.18 m/s    Systemic VTI:  0.17 m MV Vmean:      61.9 cm/s   Systemic Diam: 2.10 cm MV Decel Time: 166 msec MV E velocity: 36.40 cm/s MV A velocity: 99.40 cm/s MV E/A ratio:  0.37 Dwayne D Callwood MD Electronically signed by Yolonda Kida MD Signature Date/Time: 12/14/2021/1:41:46 PM    Final    CT Angio Chest/Abd/Pel for Dissection W and/or Wo Contrast  Result Date: 12/13/2021 CLINICAL DATA:  Acute aortic syndrome.  Chest pain beginning today. EXAM: CT ANGIOGRAPHY CHEST, ABDOMEN AND PELVIS TECHNIQUE: Multidetector CT imaging through the chest, abdomen and pelvis was performed using the standard protocol during bolus administration of intravenous contrast. Multiplanar reconstructed images and MIPs were obtained and reviewed to evaluate the vascular anatomy. RADIATION DOSE REDUCTION: This exam was performed according to the departmental dose-optimization program which includes automated exposure control, adjustment of the mA and/or kV according to patient size and/or use of iterative reconstruction technique. CONTRAST:  51mL OMNIPAQUE IOHEXOL 350 MG/ML SOLN COMPARISON:  Chest radiography same day. MRI 08/24/2021. CT 03/10/2021. CT 04/23/2019. FINDINGS: CTA CHEST FINDINGS Cardiovascular: The heart is at the upper limits of normal in size. Coronary artery calcification is present. Aortic atherosclerotic calcification is present. The aortic is tortuous. Maximal diameter of the ascending aorta is measured at 3.8 cm. Aortic branching pattern is normal without evidence of brachiocephalic vessel origin stenosis. No  evidence of aortic dissection or acute ulceration. Mediastinum/Nodes: No mass or adenopathy. Lungs/Pleura: No pleural effusion. No pulmonary infiltrate, collapse, mass or worrisome nodule. 4 mm subsolid nodule in the lateral right middle lobe is unchanged since June of 2020 and benign. Musculoskeletal: Old superior endplate fracture at E99 and minimal superior endplate deformity at B71 are unchanged. Review of the MIP images confirms the above findings. CTA ABDOMEN AND PELVIS FINDINGS VASCULAR Aorta: Aortic atherosclerosis. No aneurysm. No dissection. Maximal diameter 2.4 cm. Celiac: Atherosclerosis at the origin but without stenosis. SMA: Atherosclerosis at the origin.  30% stenosis. Renals: Atherosclerosis at both  renal artery origins. 40% stenosis on the right. No measurable stenosis on the left. IMA: Patent.  50% stenosis at the origin. Inflow: Normal Veins: Normal Review of the MIP images confirms the above findings. NON-VASCULAR Hepatobiliary: Liver parenchyma is normal. Previous cholecystectomy. Pancreas: Normal Spleen: Normal Adrenals/Urinary Tract: Adrenal glands are normal. No hydronephrosis of the kidneys. Simple appearing cysts on the right. Some renal atrophic changes, right more than left. Bladder is normal. Stomach/Bowel: Stomach and small intestine are unremarkable. Diverticulosis of the left colon without evidence of diverticulitis Lymphatic: No lymphadenopathy. Reproductive: Enlarged prostate. Other: Left inguinal hernia containing only fat. Musculoskeletal: Chronic lumbar degenerative changes. Review of the MIP images confirms the above findings. IMPRESSION: No acute vascular finding. Coronary artery calcification. Aortic atherosclerotic calcification. Maximal diameter of the ascending aorta measured at 3.8 cm. Aortic atherosclerosis without evidence of dissection or penetrating ulcer. Abdominal aortic atherosclerosis. Maximal diameter of the infrarenal abdominal aorta is only 2.4 cm.  Atherosclerosis of the aortic branch vessels, most notably 40% stenosis of the right renal artery and 50% stenosis of the inferior mesenteric artery. No acute lung finding. Stable 4 mm nodule in the right middle lobe, as distant as 04/23/2019, therefore benign. No acute abdominal organ pathology visible. Enlargement of the prostate gland. Some atrophic changes of the kidneys. Electronically Signed   By: Nelson Chimes M.D.   On: 12/13/2021 14:27      Subjective: Seen and examined on day of transfer, no complaints  Discharge Exam: Vitals:   12/16/21 1200 12/16/21 1300  BP: (!) 186/87 (!) 153/76  Pulse:    Resp:    Temp:    SpO2:     Vitals:   12/16/21 0752 12/16/21 1120 12/16/21 1200 12/16/21 1300  BP: (!) 142/60 (!) 166/80 (!) 186/87 (!) 153/76  Pulse: 70 66    Resp:      Temp: 98.4 F (36.9 C) 98.7 F (37.1 C)    TempSrc: Oral Oral    SpO2: 98% 97%    Weight:      Height:        General: Pt is alert, awake, not in acute distress Cardiovascular: RRR, S1/S2 +, no rubs, no gallops Respiratory: CTA bilaterally, no wheezing, no rhonchi Abdominal: Soft, NT, ND, bowel sounds + Extremities: no edema, no cyanosis    The results of significant diagnostics from this hospitalization (including imaging, microbiology, ancillary and laboratory) are listed below for reference.     Microbiology: Recent Results (from the past 240 hour(s))  Resp Panel by RT-PCR (Flu A&B, Covid) Nasopharyngeal Swab     Status: None   Collection Time: 12/13/21  3:52 PM   Specimen: Nasopharyngeal Swab; Nasopharyngeal(NP) swabs in vial transport medium  Result Value Ref Range Status   SARS Coronavirus 2 by RT PCR NEGATIVE NEGATIVE Final    Comment: (NOTE) SARS-CoV-2 target nucleic acids are NOT DETECTED.  The SARS-CoV-2 RNA is generally detectable in upper respiratory specimens during the acute phase of infection. The lowest concentration of SARS-CoV-2 viral copies this assay can detect is 138  copies/mL. A negative result does not preclude SARS-Cov-2 infection and should not be used as the sole basis for treatment or other patient management decisions. A negative result may occur with  improper specimen collection/handling, submission of specimen other than nasopharyngeal swab, presence of viral mutation(s) within the areas targeted by this assay, and inadequate number of viral copies(<138 copies/mL). A negative result must be combined with clinical observations, patient history, and epidemiological information. The expected result is  Negative.  Fact Sheet for Patients:  EntrepreneurPulse.com.au  Fact Sheet for Healthcare Providers:  IncredibleEmployment.be  This test is no t yet approved or cleared by the [place] FDA and  has been authorized for detection and/or diagnosis of SARS-CoV-2 by FDA under an Emergency Use Authorization (EUA). This EUA will remain  in effect (meaning this test can be used) for the duration of the COVID-19 declaration under Section 564(b)(1) of the Act, 21 U.S.C.section 360bbb-3(b)(1), unless the authorization is terminated  or revoked sooner.       Influenza A by PCR NEGATIVE NEGATIVE Final   Influenza B by PCR NEGATIVE NEGATIVE Final    Comment: (NOTE) The Xpert Xpress SARS-CoV-2/FLU/RSV plus assay is intended as an aid in the diagnosis of influenza from Nasopharyngeal swab specimens and should not be used as a sole basis for treatment. Nasal washings and aspirates are unacceptable for Xpert Xpress SARS-CoV-2/FLU/RSV testing.  Fact Sheet for Patients: EntrepreneurPulse.com.au  Fact Sheet for Healthcare Providers: IncredibleEmployment.be  This test is not yet approved or cleared by the [place] FDA and has been authorized for detection and/or diagnosis of SARS-CoV-2 by FDA under an Emergency Use Authorization (EUA). This EUA will remain in effect (meaning  this test can be used) for the duration of the COVID-19 declaration under Section 564(b)(1) of the Act, 21 U.S.C. section 360bbb-3(b)(1), unless the authorization is terminated or revoked.  Performed at [facility], [address] [place], [place] [address]      Labs: BNP (last 3 results) No results for input(s): BNP in the last 8760 hours. Basic Metabolic Panel: Recent Labs  Lab 12/13/21 1124 12/13/21 1640 12/14/21 0221 12/15/21 0611 12/16/21 0615 12/16/21 1243  NA 137  --  137 137  --  138  K 3.7  --  3.5 3.5  --  3.6  CL 100  --  104 104  --  106  CO2 27  --  25 24  --  23  GLUCOSE 141*  --  113* 119*  --  114*  BUN 27*  --  27* 28*  --  21  CREATININE 1.56* 1.42* 1.41* 1.57*  --  1.27*  CALCIUM 9.3  --  9.2 8.9  --  9.2  MG  --   --   --  2.1 2.0  --    Liver Function Tests: No results for input(s): AST, ALT, ALKPHOS, BILITOT, PROT, ALBUMIN in the last 168 hours. No results for input(s): LIPASE, AMYLASE in the last 168 hours. No results for input(s): AMMONIA in the last 168 hours. CBC: Recent Labs  Lab 12/13/21 1124 12/13/21 1640 12/14/21 0221 12/15/21 0611 12/16/21 0615  WBC 4.9 4.7 5.1 4.3 3.9*  HGB 14.7 14.2 13.7 13.1 13.0  HCT 41.8 38.8* 37.7* 36.9* 36.3*  MCV 85.5 84.5 84.0 84.8 85.2  PLT 90* 98* 98* 93* 91*   Cardiac Enzymes: No results for input(s): CKTOTAL, CKMB, CKMBINDEX, TROPONINI in the last 168 hours. BNP: Invalid input(s): POCBNP CBG: No results for input(s): GLUCAP in the last 168 hours. D-Dimer No results for input(s): DDIMER in the last 72 hours. Hgb A1c No results for input(s): HGBA1C in the last 72 hours. Lipid Profile No results for input(s): CHOL, HDL, LDLCALC, TRIG, CHOLHDL, LDLDIRECT in the last 72 hours. Thyroid function studies No results for input(s): TSH, T4TOTAL, T3FREE, THYROIDAB in the last 72 hours.  Invalid input(s): FREET3 Anemia work up No results for input(s): VITAMINB12, FOLATE, FERRITIN, TIBC,  IRON, RETICCTPCT in the  last 72 hours. Urinalysis    Component Value Date/Time   COLORURINE AMBER (A) 12/13/2021 1140   APPEARANCEUR HAZY (A) 12/13/2021 1140   LABSPEC 1.030 12/13/2021 1140   PHURINE 5.0 12/13/2021 1140   GLUCOSEU NEGATIVE 12/13/2021 1140   HGBUR NEGATIVE 12/13/2021 1140   BILIRUBINUR NEGATIVE 12/13/2021 1140   KETONESUR 20 (A) 12/13/2021 1140   PROTEINUR 30 (A) 12/13/2021 1140   NITRITE NEGATIVE 12/13/2021 1140   LEUKOCYTESUR NEGATIVE 12/13/2021 1140   Sepsis Labs Invalid input(s): PROCALCITONIN,  WBC,  LACTICIDVEN Microbiology Recent Results (from the past 240 hour(s))  Resp Panel by RT-PCR (Flu A&B, Covid) Nasopharyngeal Swab     Status: None   Collection Time: 12/13/21  3:52 PM   Specimen: Nasopharyngeal Swab; Nasopharyngeal(NP) swabs in vial transport medium  Result Value Ref Range Status   SARS Coronavirus 2 by RT PCR NEGATIVE NEGATIVE Final    Comment: (NOTE) SARS-CoV-2 target nucleic acids are NOT DETECTED.  The SARS-CoV-2 RNA is generally detectable in upper respiratory specimens during the acute phase of infection. The lowest concentration of SARS-CoV-2 viral copies this assay can detect is 138 copies/mL. A negative result does not preclude SARS-Cov-2 infection and should not be used as the sole basis for treatment or other patient management decisions. A negative result may occur with  improper specimen collection/handling, submission of specimen other than nasopharyngeal swab, presence of viral mutation(s) within the areas targeted by this assay, and inadequate number of viral copies(<138 copies/mL). A negative result must be combined with clinical observations, patient history, and epidemiological information. The expected result is Negative.  Fact Sheet for Patients:  EntrepreneurPulse.com.au  Fact Sheet for Healthcare Providers:  IncredibleEmployment.be  This test is no t yet approved or cleared by the  [place] FDA and  has been authorized for detection and/or diagnosis of SARS-CoV-2 by FDA under an Emergency Use Authorization (EUA). This EUA will remain  in effect (meaning this test can be used) for the duration of the COVID-19 declaration under Section 564(b)(1) of the Act, 21 U.S.C.section 360bbb-3(b)(1), unless the authorization is terminated  or revoked sooner.       Influenza A by PCR NEGATIVE NEGATIVE Final   Influenza B by PCR NEGATIVE NEGATIVE Final    Comment: (NOTE) The Xpert Xpress SARS-CoV-2/FLU/RSV plus assay is intended as an aid in the diagnosis of influenza from Nasopharyngeal swab specimens and should not be used as a sole basis for treatment. Nasal washings and aspirates are unacceptable for Xpert Xpress SARS-CoV-2/FLU/RSV testing.  Fact Sheet for Patients: EntrepreneurPulse.com.au  Fact Sheet for Healthcare Providers: IncredibleEmployment.be  This test is not yet approved or cleared by the [place] FDA and has been authorized for detection and/or diagnosis of SARS-CoV-2 by FDA under an Emergency Use Authorization (EUA). This EUA will remain in effect (meaning this test can be used) for the duration of the COVID-19 declaration under Section 564(b)(1) of the Act, 21 U.S.C. section 360bbb-3(b)(1), unless the authorization is terminated or revoked.  Performed at [facility], [address], [place],  [address]      Time coordinating discharge: Over 30 minutes  SIGNED:   Sidney Ace, MD  [facility] 12/16/2021, 1:49 PM Pager   If 7PM-7AM, please contact night-coverage

## 2021-12-16 NOTE — Progress Notes (Signed)
PROGRESS NOTE    Trevor Deleon.  RWE:[id number] DOB: [birth year] DOA: 12/13/2021 PCP: Latanya Maudlin, NP    Brief Narrative:  88 y.o. male with medical history significant for CAD, ckd3b, htn, dm, pancytopenia, who presents with the above.   Has had intermittent substernal mild chest pain last several months. Not exertional, doesn't radiate. Woke up with 2/10 pain today. Not pleuritic. No n/v/d. Has appetite. No fevers. No cough. Not exertional. Called EMS and they instructed him to take 4 baby aspirins which he did.  Cardiology consulted.  Status post cardiac catheterization 1/25.  Significant multivessel disease including left main, LAD, circumflex.  No significant collateralization.  Possible plan to refer to tertiary care center for CABG versus high risk PCI  Assessment & Plan:   Principal Problem:   Chest pain Active Problems:   Benign prostatic hyperplasia with nocturia   Chronic renal insufficiency, stage III (moderate) (HCC)   Pancytopenia (HCC)   Type 2 diabetes mellitus with stage 3 chronic kidney disease, without long-term current use of insulin (HCC)   Unstable angina (HCC)  Chest pain, concern for nstemi CAD Atypical chest pain but does have multiple risk factors, most recent nuclear stress in 2021 with mild non-reversible defect. CTA w/o signs acute process. EKG non-ischemic. GERD in ddx. Trops did rise 32>54>87. Cardiology consulted by EDP Plan: completed 48-hour heparin gtt. course Continue home aspirin and statin Await hear from cardiology regarding transfer plans.  Possible transfer to [facility]  # Thrombocytopenia Chronic. Unclear if previously worked up - likely further outpt w/u   # T2DM Diet controlled, here glucose wnl - monitor glucose daily   #AKI on CKD stage IIIb Worsening creatinine noted 1/25 -Daily creatinine -Hold home losartan   # BPH - home flomax   # GAD - home alpraz prn   # HTN -Holding home losartan -Continue home  doxazosin   DVT prophylaxis: SQ heparin Code Status: DNR Family Communication: Daughter and spouse at bedside 1/25 Disposition Plan: Status is: Inpatient  Remains inpatient appropriate because: CAD/NSTEMI.  Status post cardiac catheterization.  Awaiting cardiology follow-up regarding transfer plans.        Level of care: Telemetry Medical  Consultants:  Cardiology  Procedures:  Left heart catheterization 1/25  Antimicrobials: None   Subjective: Seen and examined.  Sitting up on edge of bed.  No visible distress.  No complaints of chest pain  Objective: Vitals:   12/15/21 2356 12/16/21 0435 12/16/21 0752 12/16/21 1120  BP: (!) 168/68 (!) 152/64 (!) 142/60 (!) 166/80  Pulse: (!) 59 62 70 66  Resp: 18 16    Temp: 98.1 F (36.7 C) 98.3 F (36.8 C) 98.4 F (36.9 C) 98.7 F (37.1 C)  TempSrc: Oral Oral Oral Oral  SpO2: 97% 99% 98% 97%  Weight:      Height:        Intake/Output Summary (Last 24 hours) at 12/16/2021 1155 Last data filed at 12/16/2021 1133 Gross per 24 hour  Intake 1835.68 ml  Output 900 ml  Net 935.68 ml   Filed Weights   12/13/21 1122  Weight: 81 kg    Examination:  General exam: No acute distress.  Sitting up in bed Respiratory system: Lungs clear.  Normal work of breathing.  Room air Cardiovascular system: S1-S2, regular rate and rhythm, no murmurs, no pedal edema Gastrointestinal system: Soft, NT/ND, normal bowel sounds Central nervous system: Alert and oriented. No focal neurological deficits. Extremities: Symmetric 5 x 5 power. Skin: No rashes,  lesions or ulcers Psychiatry: Judgement and insight appear normal. Mood & affect appropriate.     Data Reviewed: I have personally reviewed following labs and imaging studies  CBC: Recent Labs  Lab 12/13/21 1124 12/13/21 1640 12/14/21 0221 12/15/21 0611 12/16/21 0615  WBC 4.9 4.7 5.1 4.3 3.9*  HGB 14.7 14.2 13.7 13.1 13.0  HCT 41.8 38.8* 37.7* 36.9* 36.3*  MCV 85.5 84.5 84.0 84.8  85.2  PLT 90* 98* 98* 93* 91*   Basic Metabolic Panel: Recent Labs  Lab 12/13/21 1124 12/13/21 1640 12/14/21 0221 12/15/21 0611 12/16/21 0615  NA 137  --  137 137  --   K 3.7  --  3.5 3.5  --   CL 100  --  104 104  --   CO2 27  --  25 24  --   GLUCOSE 141*  --  113* 119*  --   BUN 27*  --  27* 28*  --   CREATININE 1.56* 1.42* 1.41* 1.57*  --   CALCIUM 9.3  --  9.2 8.9  --   MG  --   --   --  2.1 2.0   GFR: Estimated Creatinine Clearance: 35.3 mL/min (A) (by C-G formula based on SCr of 1.57 mg/dL (H)). Liver Function Tests: No results for input(s): AST, ALT, ALKPHOS, BILITOT, PROT, ALBUMIN in the last 168 hours. No results for input(s): LIPASE, AMYLASE in the last 168 hours. No results for input(s): AMMONIA in the last 168 hours. Coagulation Profile: Recent Labs  Lab 12/13/21 1905  INR 1.1   Cardiac Enzymes: No results for input(s): CKTOTAL, CKMB, CKMBINDEX, TROPONINI in the last 168 hours. BNP (last 3 results) No results for input(s): PROBNP in the last 8760 hours. HbA1C: No results for input(s): HGBA1C in the last 72 hours. CBG: No results for input(s): GLUCAP in the last 168 hours. Lipid Profile: No results for input(s): CHOL, HDL, LDLCALC, TRIG, CHOLHDL, LDLDIRECT in the last 72 hours. Thyroid Function Tests: No results for input(s): TSH, T4TOTAL, FREET4, T3FREE, THYROIDAB in the last 72 hours. Anemia Panel: No results for input(s): VITAMINB12, FOLATE, FERRITIN, TIBC, IRON, RETICCTPCT in the last 72 hours. Sepsis Labs: No results for input(s): PROCALCITON, LATICACIDVEN in the last 168 hours.  Recent Results (from the past 240 hour(s))  Resp Panel by RT-PCR (Flu A&B, Covid) Nasopharyngeal Swab     Status: None   Collection Time: 12/13/21  3:52 PM   Specimen: Nasopharyngeal Swab; Nasopharyngeal(NP) swabs in vial transport medium  Result Value Ref Range Status   SARS Coronavirus 2 by RT PCR NEGATIVE NEGATIVE Final    Comment: (NOTE) SARS-CoV-2 target nucleic  acids are NOT DETECTED.  The SARS-CoV-2 RNA is generally detectable in upper respiratory specimens during the acute phase of infection. The lowest concentration of SARS-CoV-2 viral copies this assay can detect is 138 copies/mL. A negative result does not preclude SARS-Cov-2 infection and should not be used as the sole basis for treatment or other patient management decisions. A negative result may occur with  improper specimen collection/handling, submission of specimen other than nasopharyngeal swab, presence of viral mutation(s) within the areas targeted by this assay, and inadequate number of viral copies(<138 copies/mL). A negative result must be combined with clinical observations, patient history, and epidemiological information. The expected result is Negative.  Fact Sheet for Patients:  EntrepreneurPulse.com.au  Fact Sheet for Healthcare Providers:  IncredibleEmployment.be  This test is no t yet approved or cleared by the [place] FDA and  has been  authorized for detection and/or diagnosis of SARS-CoV-2 by FDA under an Emergency Use Authorization (EUA). This EUA will remain  in effect (meaning this test can be used) for the duration of the COVID-19 declaration under Section 564(b)(1) of the Act, 21 U.S.C.section 360bbb-3(b)(1), unless the authorization is terminated  or revoked sooner.       Influenza A by PCR NEGATIVE NEGATIVE Final   Influenza B by PCR NEGATIVE NEGATIVE Final    Comment: (NOTE) The Xpert Xpress SARS-CoV-2/FLU/RSV plus assay is intended as an aid in the diagnosis of influenza from Nasopharyngeal swab specimens and should not be used as a sole basis for treatment. Nasal washings and aspirates are unacceptable for Xpert Xpress SARS-CoV-2/FLU/RSV testing.  Fact Sheet for Patients: EntrepreneurPulse.com.au  Fact Sheet for Healthcare Providers: IncredibleEmployment.be  This  test is not yet approved or cleared by the [place] FDA and has been authorized for detection and/or diagnosis of SARS-CoV-2 by FDA under an Emergency Use Authorization (EUA). This EUA will remain in effect (meaning this test can be used) for the duration of the COVID-19 declaration under Section 564(b)(1) of the Act, 21 U.S.C. section 360bbb-3(b)(1), unless the authorization is terminated or revoked.  Performed at [facility], [address], [place], [place] [address]          Radiology Studies: CARDIAC CATHETERIZATION  Result Date: 12/16/2021   Mid LM lesion is 50% stenosed.   Mid LAD lesion is 95% stenosed.   Mid RCA-1 lesion is 20% stenosed.   Mid RCA-2 lesion is 50% stenosed.   RPAV-1 lesion is 50% stenosed.   Prox RCA lesion is 25% stenosed.   Dist RCA lesion is 50% stenosed with 50% stenosed side branch in RPDA.   RPAV-2 lesion is 50% stenosed with 50% stenosed side branch in 3rd RPL.   Ost Cx to Dist Cx lesion is 50% stenosed.   There is mild left ventricular systolic dysfunction.   LV end diastolic pressure is mildly elevated.   The left ventricular ejection fraction is 45-50% by visual estimate.   There is no mitral valve regurgitation. Conclusion Mildly reduced left ventricular function with anterior apical hypokinesis ejection fraction around 45%. Coronaries Left main possible with napkin ring 50% mid lesion LAD large 95% mid lesion Circumflex medium in size diffuse 50% throughout RCA large mid stent widely patent distal RCA about 50% TIMI-3 flow Intervention deferred Case should be referred to tertiary care center for evaluation of left main and mid LAD Evaluate for possible complex PCI versus CABG because of left main Case will be discussed with Dr. Ronnald Ramp at [facility] Consider [facility] for further management and treatment        Scheduled Meds:  aspirin EC  81 mg Oral Daily   doxazosin  8 mg Oral QHS   losartan  50 mg Oral Daily   pantoprazole  40 mg  Oral Daily   simvastatin  40 mg Oral QHS   sodium chloride flush  3 mL Intravenous Q12H   Continuous Infusions:  sodium chloride       LOS: 1 day    Time spent: 35 minutes    Sidney Ace, MD [facility]   If 7PM-7AM, please contact night-coverage  12/16/2021, 11:55 AM

## 2021-12-23 ENCOUNTER — Other Ambulatory Visit: Payer: Self-pay

## 2021-12-23 ENCOUNTER — Other Ambulatory Visit: Payer: Self-pay | Admitting: Cardiology

## 2021-12-23 ENCOUNTER — Ambulatory Visit
Admission: RE | Admit: 2021-12-23 | Discharge: 2021-12-23 | Disposition: A | Discharge status: Home / Self Care | Payer: PPO | Source: Ambulatory Visit | Attending: Cardiology | Admitting: Cardiology

## 2021-12-23 DIAGNOSIS — I729 Aneurysm of unspecified site: Secondary | ICD-10-CM

## 2021-12-24 ENCOUNTER — Ambulatory Visit
Admission: RE | Admit: 2021-12-24 | Discharge: 2021-12-24 | Disposition: A | Discharge status: Home / Self Care | Payer: PPO | Attending: Cardiology | Admitting: Cardiology

## 2021-12-24 ENCOUNTER — Encounter
Admission: RE | Disposition: A | Discharge status: Home / Self Care | Payer: Self-pay | Source: Home / Self Care | Attending: Cardiology

## 2021-12-24 DIAGNOSIS — I729 Aneurysm of unspecified site: Secondary | ICD-10-CM | POA: Diagnosis not present

## 2021-12-24 HISTORY — PX: PSEUDOANERYSM COMPRESSION: CATH118259

## 2021-12-24 SURGERY — PSEUDOANERYSM COMPRESSION
Anesthesia: Moderate Sedation

## 2021-12-24 MED ORDER — CLOPIDOGREL BISULFATE 75 MG PO TABS: 75.0000 mg | ORAL_TABLET | Freq: Every day | ORAL | Status: DC

## 2021-12-24 MED ORDER — SODIUM CHLORIDE 0.9% FLUSH: 3.0000 mL | Freq: Two times a day (BID) | INTRAVENOUS | Status: DC

## 2021-12-24 MED ORDER — SODIUM CHLORIDE 0.9 % IV SOLN: INTRAVENOUS | Status: DC

## 2021-12-24 MED ORDER — HYDRALAZINE HCL 20 MG/ML IJ SOLN: 10.0000 mg | INTRAMUSCULAR | Status: DC | PRN

## 2021-12-24 MED ORDER — SODIUM CHLORIDE 0.9% FLUSH: 3.0000 mL | INTRAVENOUS | Status: DC | PRN

## 2021-12-24 MED ORDER — ACETAMINOPHEN 325 MG PO TABS
ORAL_TABLET | ORAL | Status: AC
  Administered 2021-12-24: 650 mg via ORAL
  Filled 2021-12-24: qty 2

## 2021-12-24 MED ORDER — LABETALOL HCL 5 MG/ML IV SOLN: 10.0000 mg | INTRAVENOUS | Status: DC | PRN

## 2021-12-24 MED ORDER — ACETAMINOPHEN 325 MG PO TABS: 650.0000 mg | ORAL_TABLET | ORAL | Status: DC | PRN

## 2021-12-24 MED ORDER — ONDANSETRON HCL 4 MG/2ML IJ SOLN: 4.0000 mg | Freq: Four times a day (QID) | INTRAMUSCULAR | Status: DC | PRN

## 2021-12-24 MED ORDER — SODIUM CHLORIDE 0.9 % IV SOLN: 250.0000 mL | INTRAVENOUS | Status: DC | PRN

## 2021-12-24 SURGICAL SUPPLY — 1 items: DEVICE RAD TR BAND REGULAR (VASCULAR PRODUCTS) ×1 IMPLANT

## 2021-12-27 ENCOUNTER — Encounter: Payer: Self-pay | Admitting: Cardiology

## 2021-12-30 ENCOUNTER — Other Ambulatory Visit: Payer: Self-pay | Admitting: Cardiology

## 2021-12-30 ENCOUNTER — Ambulatory Visit
Admission: RE | Admit: 2021-12-30 | Discharge: 2021-12-30 | Disposition: A | Discharge status: Home / Self Care | Payer: PPO | Source: Ambulatory Visit | Attending: Cardiology | Admitting: Cardiology

## 2021-12-30 ENCOUNTER — Other Ambulatory Visit: Payer: Self-pay

## 2021-12-30 DIAGNOSIS — T81718A Complication of other artery following a procedure, not elsewhere classified, initial encounter: Secondary | ICD-10-CM | POA: Diagnosis not present

## 2021-12-30 DIAGNOSIS — I729 Aneurysm of unspecified site: Secondary | ICD-10-CM | POA: Diagnosis present

## 2022-10-05 ENCOUNTER — Other Ambulatory Visit: Payer: Self-pay | Admitting: Gerontology

## 2022-10-05 DIAGNOSIS — I7121 Aneurysm of the ascending aorta, without rupture: Secondary | ICD-10-CM

## 2022-10-21 ENCOUNTER — Ambulatory Visit
Admission: RE | Admit: 2022-10-21 | Discharge: 2022-10-21 | Disposition: A | Discharge status: Home / Self Care | Payer: PPO | Source: Ambulatory Visit | Attending: Gerontology | Admitting: Gerontology

## 2022-10-21 DIAGNOSIS — I7121 Aneurysm of the ascending aorta, without rupture: Secondary | ICD-10-CM | POA: Insufficient documentation

## 2022-10-23 NOTE — Progress Notes (Unsigned)
10/24/2022 5:18 PM   Trevor Battles Jr. [birth year] [id number]  Referring provider: No referring provider defined for this encounter.  Urological history 1. Elevated PSA -aged out of screening -PSA 2.65 in 2017  -PSA was as high as 8.0 in the past -underwent prostate biopsy in 5/07 and 10/2014 - negative  2. BPH with LU TS -TUMT in the remote past -I PSS *** -PVR *** mL  -doxazosin 8 mg daily  No chief complaint on file.    HPI: Trevor Deleon. Is a 88 y.o. male who presents today for yearly visit.    He had some issues with bleeding angiokeratomas earlier in the year.  He was also admitted for atypical chest pain in 11/2021 and underwent PCI at [facility].      Score:  1-7 Mild 8-19 Moderate 20-35 Severe    PMH: Past Medical History:  Diagnosis Date   Arthritis    BPH (benign prostatic hyperplasia)    CKD (chronic kidney disease) stage 3, GFR 30-59 ml/min (HCC)    Coronary artery disease    Diabetes mellitus without complication (HCC)    Pt does not take medications.   Duodenum disorder    BRUNNERS GLAND   ED (erectile dysfunction)    GERD (gastroesophageal reflux disease)    Hernia, femoral    History of hiatal hernia    Hyperlipidemia    Hypertension    Hypogonadism in male    Labyrinthitis    Myocardial infarction ([place])    1999   Pancytopenia ([place])    Stricture esophagus     Surgical History: Past Surgical History:  Procedure Laterality Date   APPENDECTOMY     CARDIAC CATHETERIZATION     CARDIAC SURGERY     CHOLECYSTECTOMY N/A 09/02/2015   Procedure: LAPAROSCOPIC CHOLECYSTECTOMY;  Surgeon: Leonie Green, MD;  Location: [facility] ORS;  Service: General;  Laterality: N/A;   COLONOSCOPY     CORONARY ANGIOPLASTY     ERCP Left 09/03/2015   Procedure: ENDOSCOPIC RETROGRADE CHOLANGIOPANCREATOGRAPHY (ERCP);  Surgeon: Hulen Luster, MD;  [facility] ENDOSCOPY;  Service: Endoscopy;  Laterality: Left;   ESOPHAGOGASTRODUODENOSCOPY (EGD) WITH  PROPOFOL N/A 08/29/2017   Procedure: ESOPHAGOGASTRODUODENOSCOPY (EGD) WITH PROPOFOL;  Surgeon: Toledo, Benay Pike, MD;  Location: [facility] ENDOSCOPY;  Service: Gastroenterology;  Laterality: N/A;   FLEXIBLE SIGMOIDOSCOPY     HERNIA REPAIR     INGUINAL HERNIA REPAIR Left 12/15/2015   Procedure: HERNIA REPAIR INGUINAL ADULT;  Surgeon: Leonie Green, MD;  Location: [facility] ORS;  Service: General;  Laterality: Left;   INTRAOPERATIVE CHOLANGIOGRAM  09/02/2015   Procedure: INTRAOPERATIVE CHOLANGIOGRAM;  Surgeon: Leonie Green, MD;  Location: [facility] ORS;  Service: General;;   JOINT REPLACEMENT Left    LEFT HEART CATH AND CORONARY ANGIOGRAPHY N/A 12/15/2021   Procedure: LEFT HEART CATH AND CORONARY ANGIOGRAPHY PCI and Stent;  Surgeon: Yolonda Kida, MD;  Location: [place] CV LAB;  Service: Cardiovascular;  Laterality: N/A;   LIPOMA EXCISION     PROSTATE SURGERY  12/17/2014   PSEUDOANERYSM COMPRESSION N/A 12/24/2021   Procedure: PSEUDOANERYSM COMPRESSION;  Surgeon: Andrez Grime, MD;  Location: [place] CV LAB;  Service: Cardiovascular;  Laterality: N/A;   TONSILLECTOMY      Home Medications:  Allergies as of 10/24/2022   No Known Allergies      Medication List        Accurate as of October 23, 2022  5:18 PM. If you have any questions, ask  your nurse or doctor.          acetaminophen 650 MG CR tablet Commonly known as: TYLENOL Take 650 mg by mouth every 6 (six) hours as needed for pain.   ALPRAZolam 0.25 MG tablet Commonly known as: XANAX Take 1 tablet by mouth at bedtime as needed for sleep.   aspirin EC 81 MG tablet Take 81 mg by mouth daily.   clopidogrel 75 MG tablet Commonly known as: PLAVIX Take 1 tablet (75 mg total) by mouth daily.   docusate sodium 100 MG capsule Commonly known as: COLACE Take 100 mg by mouth 2 (two) times daily.   doxazosin 8 MG tablet Commonly known as: CARDURA Take 8 mg by mouth daily.   Fish Oil 1000 MG Caps Take 3,000  mg by mouth 2 (two) times daily.   isosorbide mononitrate 30 MG 24 hr tablet Commonly known as: IMDUR Take 1 tablet (30 mg total) by mouth daily.   losartan 25 MG tablet Commonly known as: COZAAR Take 25 mg by mouth 2 (two) times daily.   METAMUCIL PO Take 1 Dose by mouth daily. Reported on 12/15/2015   metoprolol tartrate 25 MG tablet Commonly known as: LOPRESSOR Take 1 tablet (25 mg total) by mouth 2 (two) times daily.   MIRALAX PO Take 17 g by mouth daily.   nitroGLYCERIN 0.4 MG SL tablet Commonly known as: NITROSTAT Place 0.4 mg under the tongue. UP TO 3 DOSES   nystatin-triamcinolone ointment Commonly known as: MYCOLOG Apply 1 application topically 2 (two) times daily.   pantoprazole 40 MG tablet Commonly known as: PROTONIX Take 40 mg by mouth daily.   potassium chloride 10 MEQ tablet Commonly known as: KLOR-CON M Take 30 mEq by mouth once.   simvastatin 40 MG tablet Commonly known as: ZOCOR Take 40 mg by mouth at bedtime.        Allergies: No Known Allergies  Family History: Family History  Problem Relation Age of Onset   Hypertension Mother    Stroke Mother    Prostate cancer Father    Heart attack Father     Social History:  reports that he has quit smoking. His smoking use included cigarettes. He has a 15.00 pack-year smoking history. He has never used smokeless tobacco. He reports that he does not drink alcohol and does not use drugs.  ROS: Pertinent ROS in HPI  Physical Exam: There were no vitals taken for this visit.  Constitutional:  Well nourished. Alert and oriented, No acute distress. HEENT: [place] AT, moist mucus membranes.  Trachea midline Cardiovascular: No clubbing, cyanosis, or edema. Respiratory: Normal respiratory effort, no increased work of breathing. Neurologic: Grossly intact, no focal deficits, moving all 4 extremities. Psychiatric: Normal mood and affect.   Laboratory Data: Serum creatinine (09/2022) 1.3 Hemoglobin A1c  (09/2022) 6.0% I have reviewed the labs.   Pertinent Imaging: ***  Assessment & Plan:    1. BPH with LUTS -PSA stable *** -DRE benign *** -UA benign *** -PVR < 300 cc *** -symptoms - *** -most bothersome symptoms are *** -continue conservative management, avoiding bladder irritants and timed voiding's -Initiate alpha-blocker (***), discussed side effects *** -Initiate 5 alpha reductase inhibitor (***), discussed side effects *** -Continue doxazosin 8 mg daily  -Cannot tolerate medication or medication failure, schedule cystoscopy ***   No follow-ups on file.  These notes generated with voice recognition software. I apologize for typographical errors.  Royden Purl  Gratis [address]  [place], [place] [address] [phone]

## 2022-10-24 ENCOUNTER — Ambulatory Visit (INDEPENDENT_AMBULATORY_CARE_PROVIDER_SITE_OTHER): Payer: PPO | Admitting: Urology

## 2022-10-24 ENCOUNTER — Encounter: Payer: Self-pay | Admitting: Urology

## 2022-10-24 VITALS — BP 130/70 | HR 82 | Ht 71.0 in | Wt 166.6 lb

## 2022-10-24 DIAGNOSIS — R351 Nocturia: Secondary | ICD-10-CM | POA: Diagnosis not present

## 2022-10-24 DIAGNOSIS — N489 Disorder of penis, unspecified: Secondary | ICD-10-CM

## 2022-10-24 DIAGNOSIS — L258 Unspecified contact dermatitis due to other agents: Secondary | ICD-10-CM | POA: Diagnosis not present

## 2022-10-24 DIAGNOSIS — N401 Enlarged prostate with lower urinary tract symptoms: Secondary | ICD-10-CM

## 2022-10-24 DIAGNOSIS — R32 Unspecified urinary incontinence: Secondary | ICD-10-CM

## 2022-10-24 LAB — BLADDER SCAN AMB NON-IMAGING

## 2022-10-24 MED ORDER — NYSTATIN-TRIAMCINOLONE 100000-0.1 UNIT/GM-% EX OINT: 1.0000 | TOPICAL_OINTMENT | Freq: Two times a day (BID) | CUTANEOUS | 3 refills | Status: AC

## 2022-10-24 MED ORDER — GEMTESA 75 MG PO TABS: 75.0000 mg | ORAL_TABLET | Freq: Every day | ORAL | 0 refills | Status: DC

## 2022-11-25 NOTE — Progress Notes (Unsigned)
11/28/2022 11:03 AM   Trevor Deleon. 1932/08/31 [id number]  Referring provider: Latanya Maudlin, NP [address] [place],  [place] [address]  Urological history 1. Elevated PSA -aged out of screening -PSA 2.65 in 2017  -PSA was as high as 8.0 in the past -underwent prostate biopsy in 5/07 and 10/2014 - negative  2. BPH with LU TS -TUMT in the remote past -I PSS 5/2 -PVR 0 mL  -doxazosin 8 mg daily  No chief complaint on file.    HPI: Trevor Deleon. Is a 89 y.o. male who presents today for a one month follow up after [place]   At his visit on 10/24/2022, he had some issues with bleeding angiokeratomas earlier in the year.  He was also admitted for atypical chest pain in 11/2021 and underwent PCI at [facility].  I PSS 5/2.  PVR 0 mL.  He is having issues with nocturia x 2.  He has noticed a small red area on his penis that is not painful or itching.  He has also noticed a rash in his left groin area that is itching.  He was prescribed Mycolog cream and Gemtesa samples.      Score:  1-7 Mild 8-19 Moderate 20-35 Severe    PMH: Past Medical History:  Diagnosis Date   Arthritis    BPH (benign prostatic hyperplasia)    CKD (chronic kidney disease) stage 3, GFR 30-59 ml/min (HCC)    Coronary artery disease    Diabetes mellitus without complication (HCC)    Pt does not take medications.   Duodenum disorder    BRUNNERS GLAND   ED (erectile dysfunction)    GERD (gastroesophageal reflux disease)    Hernia, femoral    History of hiatal hernia    Hyperlipidemia    Hypertension    Hypogonadism in male    Labyrinthitis    Myocardial infarction ([place])    1999   Pancytopenia ([place])    Stricture esophagus     Surgical History: Past Surgical History:  Procedure Laterality Date   APPENDECTOMY     CARDIAC CATHETERIZATION     CARDIAC SURGERY     CHOLECYSTECTOMY N/A 09/02/2015   Procedure: LAPAROSCOPIC CHOLECYSTECTOMY;  Surgeon: Leonie Green, MD;   Location: [facility] ORS;  Service: General;  Laterality: N/A;   COLONOSCOPY     CORONARY ANGIOPLASTY     ERCP Left 09/03/2015   Procedure: ENDOSCOPIC RETROGRADE CHOLANGIOPANCREATOGRAPHY (ERCP);  Surgeon: Hulen Luster, MD;  Location: [facility] ENDOSCOPY;  Service: Endoscopy;  Laterality: Left;   ESOPHAGOGASTRODUODENOSCOPY (EGD) WITH PROPOFOL N/A 08/29/2017   Procedure: ESOPHAGOGASTRODUODENOSCOPY (EGD) WITH PROPOFOL;  Surgeon: Toledo, Benay Pike, MD;  Location: [facility] ENDOSCOPY;  Service: Gastroenterology;  Laterality: N/A;   FLEXIBLE SIGMOIDOSCOPY     HERNIA REPAIR     INGUINAL HERNIA REPAIR Left 12/15/2015   Procedure: HERNIA REPAIR INGUINAL ADULT;  Surgeon: Leonie Green, MD;  Location: [facility] ORS;  Service: General;  Laterality: Left;   INTRAOPERATIVE CHOLANGIOGRAM  09/02/2015   Procedure: INTRAOPERATIVE CHOLANGIOGRAM;  Surgeon: Leonie Green, MD;  Location: [facility] ORS;  Service: General;;   JOINT REPLACEMENT Left    LEFT HEART CATH AND CORONARY ANGIOGRAPHY N/A 12/15/2021   Procedure: LEFT HEART CATH AND CORONARY ANGIOGRAPHY PCI and Stent;  Surgeon: Yolonda Kida, MD;  Location: [place] CV LAB;  Service: Cardiovascular;  Laterality: N/A;   LIPOMA EXCISION     PROSTATE SURGERY  12/17/2014   PSEUDOANERYSM COMPRESSION N/A 12/24/2021  Procedure: PSEUDOANERYSM COMPRESSION;  Surgeon: Andrez Grime, MD;  Location: [place] CV LAB;  Service: Cardiovascular;  Laterality: N/A;   TONSILLECTOMY      Home Medications:  Allergies as of 11/28/2022   No Known Allergies      Medication List        Accurate as of November 25, 2022 11:03 AM. If you have any questions, ask your nurse or doctor.          acetaminophen 650 MG CR tablet Commonly known as: TYLENOL Take 650 mg by mouth every 6 (six) hours as needed for pain.   ALPRAZolam 0.25 MG tablet Commonly known as: XANAX Take 1 tablet by mouth at bedtime as needed for sleep.   aspirin EC 81 MG tablet Take 81 mg by mouth daily.    atorvastatin 80 MG tablet Commonly known as: LIPITOR Take 80 mg by mouth daily.   clobetasol ointment 0.05 % Commonly known as: TEMOVATE Apply 1 Application topically 2 (two) times daily.   docusate sodium 100 MG capsule Commonly known as: COLACE Take 100 mg by mouth 2 (two) times daily.   doxazosin 8 MG tablet Commonly known as: CARDURA Take 8 mg by mouth daily.   Fish Oil 1000 MG Caps Take 3,000 mg by mouth 2 (two) times daily.   Gemtesa 75 MG Tabs Generic drug: Vibegron Take 75 mg by mouth daily.   hydrocortisone 2.5 % cream Apply 1 Application topically 2 (two) times daily.   lisinopril 2.5 MG tablet Commonly known as: ZESTRIL Take 2.5 mg by mouth daily.   METAMUCIL PO Take 1 Dose by mouth daily. Reported on 12/15/2015   MIRALAX PO Take 17 g by mouth daily.   nitroGLYCERIN 0.4 MG SL tablet Commonly known as: NITROSTAT Place 0.4 mg under the tongue. UP TO 3 DOSES   nystatin-triamcinolone ointment Commonly known as: MYCOLOG Apply 1 Application topically 2 (two) times daily.   pantoprazole 40 MG tablet Commonly known as: PROTONIX Take 40 mg by mouth daily.   potassium chloride 10 MEQ tablet Commonly known as: KLOR-CON M Take 30 mEq by mouth once.   tamsulosin 0.4 MG Caps capsule Commonly known as: FLOMAX Take 0.4 mg by mouth at bedtime.        Allergies: No Known Allergies  Family History: Family History  Problem Relation Age of Onset   Hypertension Mother    Stroke Mother    Prostate cancer Father    Heart attack Father     Social History:  reports that he has quit smoking. His smoking use included cigarettes. He has a 15.00 pack-year smoking history. He has never used smokeless tobacco. He reports that he does not drink alcohol and does not use drugs.  ROS: Pertinent ROS in HPI  Physical Exam: There were no vitals taken for this visit.  Constitutional:  Well nourished. Alert and oriented, No acute distress. HEENT: [place] AT, moist mucus  membranes.  Trachea midline Cardiovascular: No clubbing, cyanosis, or edema. Respiratory: Normal respiratory effort, no increased work of breathing. GU: No CVA tenderness.  No bladder fullness or masses.  Patient with circumcised/uncircumcised phallus. ***Foreskin easily retracted***  Urethral meatus is patent.  No penile discharge. No penile lesions or rashes. Scrotum without lesions, cysts, rashes and/or edema.  Testicles are located scrotally bilaterally. No masses are appreciated in the testicles. Left and right epididymis are normal. Rectal: Patient with  normal sphincter tone. Anus and perineum without scarring or rashes. No rectal masses are appreciated. Prostate  is approximately *** grams, *** nodules are appreciated. Seminal vesicles are normal. Neurologic: Grossly intact, no focal deficits, moving all 4 extremities. Psychiatric: Normal mood and affect.   Laboratory Data: N/A  Pertinent Imaging: ***   Assessment & Plan:    1. BPH with LUTS -PVR < 300 cc  -symptoms - nocturia x 2 -continue conservative management, avoiding bladder irritants and timed voiding's -Continue doxazosin 8 mg daily   2.  Tinea cruris -Prescribed Mycolog cream to place in the area twice daily for the next 2 weeks  3. Penile lesion -Explained that this is likely due to rupturing the surface blood vessel and that should spontaneously resolve  4. Nocturia -Vesicare gave him symptoms of palpitation and anxiety -Detrol was not cost effective -Given samples of Gemtesa 75 mg to take before bedtime, I did explain to him that this is a brand-new medication and will likely be very expensive but he still wanted to try the samples  No follow-ups on file.  These notes generated with voice recognition software. I apologize for typographical errors.  [place], [place] [address]  [place], [place] [address] [phone]

## 2022-11-28 ENCOUNTER — Encounter: Payer: Self-pay | Admitting: Urology

## 2022-11-28 ENCOUNTER — Ambulatory Visit (INDEPENDENT_AMBULATORY_CARE_PROVIDER_SITE_OTHER): Payer: PPO | Admitting: Urology

## 2022-11-28 VITALS — BP 139/81 | HR 100 | Ht 71.0 in | Wt 168.2 lb

## 2022-11-28 DIAGNOSIS — R351 Nocturia: Secondary | ICD-10-CM

## 2022-11-28 DIAGNOSIS — N401 Enlarged prostate with lower urinary tract symptoms: Secondary | ICD-10-CM

## 2022-11-28 DIAGNOSIS — L258 Unspecified contact dermatitis due to other agents: Secondary | ICD-10-CM

## 2022-11-28 DIAGNOSIS — R32 Unspecified urinary incontinence: Secondary | ICD-10-CM | POA: Diagnosis not present

## 2022-11-28 LAB — BLADDER SCAN AMB NON-IMAGING

## 2022-11-29 DIAGNOSIS — L570 Actinic keratosis: Secondary | ICD-10-CM | POA: Diagnosis not present

## 2022-11-29 DIAGNOSIS — L304 Erythema intertrigo: Secondary | ICD-10-CM | POA: Diagnosis not present

## 2023-01-02 ENCOUNTER — Other Ambulatory Visit: Payer: Self-pay | Admitting: *Deleted

## 2023-01-02 MED ORDER — GEMTESA 75 MG PO TABS: 75.0000 mg | ORAL_TABLET | Freq: Every day | ORAL | 3 refills | Status: AC

## 2023-01-18 DIAGNOSIS — H25813 Combined forms of age-related cataract, bilateral: Secondary | ICD-10-CM | POA: Diagnosis not present

## 2023-01-18 DIAGNOSIS — H40013 Open angle with borderline findings, low risk, bilateral: Secondary | ICD-10-CM | POA: Diagnosis not present

## 2023-01-24 DIAGNOSIS — E785 Hyperlipidemia, unspecified: Secondary | ICD-10-CM | POA: Diagnosis not present

## 2023-01-24 DIAGNOSIS — E1169 Type 2 diabetes mellitus with other specified complication: Secondary | ICD-10-CM | POA: Diagnosis not present

## 2023-01-24 DIAGNOSIS — I152 Hypertension secondary to endocrine disorders: Secondary | ICD-10-CM | POA: Diagnosis not present

## 2023-01-24 DIAGNOSIS — E1122 Type 2 diabetes mellitus with diabetic chronic kidney disease: Secondary | ICD-10-CM | POA: Diagnosis not present

## 2023-01-24 DIAGNOSIS — E1159 Type 2 diabetes mellitus with other circulatory complications: Secondary | ICD-10-CM | POA: Diagnosis not present

## 2023-01-24 DIAGNOSIS — I700 Atherosclerosis of aorta: Secondary | ICD-10-CM | POA: Diagnosis not present

## 2023-01-24 DIAGNOSIS — I25119 Atherosclerotic heart disease of native coronary artery with unspecified angina pectoris: Secondary | ICD-10-CM | POA: Diagnosis not present

## 2023-01-24 DIAGNOSIS — N183 Chronic kidney disease, stage 3 unspecified: Secondary | ICD-10-CM | POA: Diagnosis not present

## 2023-02-07 DIAGNOSIS — K130 Diseases of lips: Secondary | ICD-10-CM | POA: Diagnosis not present

## 2023-02-07 DIAGNOSIS — N3281 Overactive bladder: Secondary | ICD-10-CM | POA: Diagnosis not present

## 2023-04-04 DIAGNOSIS — N1831 Chronic kidney disease, stage 3a: Secondary | ICD-10-CM | POA: Diagnosis not present

## 2023-04-04 DIAGNOSIS — Z09 Encounter for follow-up examination after completed treatment for conditions other than malignant neoplasm: Secondary | ICD-10-CM | POA: Diagnosis not present

## 2023-04-04 DIAGNOSIS — E876 Hypokalemia: Secondary | ICD-10-CM | POA: Diagnosis not present

## 2023-04-04 DIAGNOSIS — N401 Enlarged prostate with lower urinary tract symptoms: Secondary | ICD-10-CM | POA: Diagnosis not present

## 2023-04-04 DIAGNOSIS — I25119 Atherosclerotic heart disease of native coronary artery with unspecified angina pectoris: Secondary | ICD-10-CM | POA: Diagnosis not present

## 2023-04-04 DIAGNOSIS — K5909 Other constipation: Secondary | ICD-10-CM | POA: Diagnosis not present

## 2023-04-04 DIAGNOSIS — E538 Deficiency of other specified B group vitamins: Secondary | ICD-10-CM | POA: Diagnosis not present

## 2023-04-04 DIAGNOSIS — E1159 Type 2 diabetes mellitus with other circulatory complications: Secondary | ICD-10-CM | POA: Diagnosis not present

## 2023-04-04 DIAGNOSIS — E1169 Type 2 diabetes mellitus with other specified complication: Secondary | ICD-10-CM | POA: Diagnosis not present

## 2023-04-04 DIAGNOSIS — N3281 Overactive bladder: Secondary | ICD-10-CM | POA: Diagnosis not present

## 2023-04-04 DIAGNOSIS — K219 Gastro-esophageal reflux disease without esophagitis: Secondary | ICD-10-CM | POA: Diagnosis not present

## 2023-04-04 DIAGNOSIS — E1122 Type 2 diabetes mellitus with diabetic chronic kidney disease: Secondary | ICD-10-CM | POA: Diagnosis not present

## 2023-04-06 DIAGNOSIS — E1159 Type 2 diabetes mellitus with other circulatory complications: Secondary | ICD-10-CM | POA: Diagnosis not present

## 2023-04-06 DIAGNOSIS — T502X5A Adverse effect of carbonic-anhydrase inhibitors, benzothiadiazides and other diuretics, initial encounter: Secondary | ICD-10-CM | POA: Diagnosis not present

## 2023-04-06 DIAGNOSIS — E1122 Type 2 diabetes mellitus with diabetic chronic kidney disease: Secondary | ICD-10-CM | POA: Diagnosis not present

## 2023-04-06 DIAGNOSIS — E876 Hypokalemia: Secondary | ICD-10-CM | POA: Diagnosis not present

## 2023-04-06 DIAGNOSIS — I152 Hypertension secondary to endocrine disorders: Secondary | ICD-10-CM | POA: Diagnosis not present

## 2023-04-06 DIAGNOSIS — N1831 Chronic kidney disease, stage 3a: Secondary | ICD-10-CM | POA: Diagnosis not present

## 2023-04-06 DIAGNOSIS — N183 Chronic kidney disease, stage 3 unspecified: Secondary | ICD-10-CM | POA: Diagnosis not present

## 2023-05-11 DIAGNOSIS — N1831 Chronic kidney disease, stage 3a: Secondary | ICD-10-CM | POA: Diagnosis not present

## 2023-05-11 DIAGNOSIS — R351 Nocturia: Secondary | ICD-10-CM | POA: Diagnosis not present

## 2023-05-11 DIAGNOSIS — I700 Atherosclerosis of aorta: Secondary | ICD-10-CM | POA: Diagnosis not present

## 2023-05-11 DIAGNOSIS — D692 Other nonthrombocytopenic purpura: Secondary | ICD-10-CM | POA: Diagnosis not present

## 2023-05-11 DIAGNOSIS — Z955 Presence of coronary angioplasty implant and graft: Secondary | ICD-10-CM | POA: Diagnosis not present

## 2023-05-11 DIAGNOSIS — I2510 Atherosclerotic heart disease of native coronary artery without angina pectoris: Secondary | ICD-10-CM | POA: Diagnosis not present

## 2023-05-11 DIAGNOSIS — E860 Dehydration: Secondary | ICD-10-CM | POA: Diagnosis not present

## 2023-05-11 DIAGNOSIS — N401 Enlarged prostate with lower urinary tract symptoms: Secondary | ICD-10-CM | POA: Diagnosis not present

## 2023-05-11 DIAGNOSIS — I129 Hypertensive chronic kidney disease with stage 1 through stage 4 chronic kidney disease, or unspecified chronic kidney disease: Secondary | ICD-10-CM | POA: Diagnosis not present

## 2023-05-11 DIAGNOSIS — E1122 Type 2 diabetes mellitus with diabetic chronic kidney disease: Secondary | ICD-10-CM | POA: Diagnosis not present

## 2023-05-18 DIAGNOSIS — I7121 Aneurysm of the ascending aorta, without rupture: Secondary | ICD-10-CM | POA: Diagnosis not present

## 2023-05-18 DIAGNOSIS — I25119 Atherosclerotic heart disease of native coronary artery with unspecified angina pectoris: Secondary | ICD-10-CM | POA: Diagnosis not present

## 2023-05-18 DIAGNOSIS — N183 Chronic kidney disease, stage 3 unspecified: Secondary | ICD-10-CM | POA: Diagnosis not present

## 2023-05-18 DIAGNOSIS — I700 Atherosclerosis of aorta: Secondary | ICD-10-CM | POA: Diagnosis not present

## 2023-05-18 DIAGNOSIS — I2089 Other forms of angina pectoris: Secondary | ICD-10-CM | POA: Diagnosis not present

## 2023-05-18 DIAGNOSIS — E1122 Type 2 diabetes mellitus with diabetic chronic kidney disease: Secondary | ICD-10-CM | POA: Diagnosis not present

## 2023-05-18 DIAGNOSIS — K219 Gastro-esophageal reflux disease without esophagitis: Secondary | ICD-10-CM | POA: Diagnosis not present

## 2023-05-18 DIAGNOSIS — E1169 Type 2 diabetes mellitus with other specified complication: Secondary | ICD-10-CM | POA: Diagnosis not present

## 2023-05-18 DIAGNOSIS — I152 Hypertension secondary to endocrine disorders: Secondary | ICD-10-CM | POA: Diagnosis not present

## 2023-05-18 DIAGNOSIS — I252 Old myocardial infarction: Secondary | ICD-10-CM | POA: Diagnosis not present

## 2023-05-18 DIAGNOSIS — I214 Non-ST elevation (NSTEMI) myocardial infarction: Secondary | ICD-10-CM | POA: Diagnosis not present

## 2023-05-18 DIAGNOSIS — E1159 Type 2 diabetes mellitus with other circulatory complications: Secondary | ICD-10-CM | POA: Diagnosis not present

## 2023-06-01 DIAGNOSIS — L570 Actinic keratosis: Secondary | ICD-10-CM | POA: Diagnosis not present

## 2023-06-01 DIAGNOSIS — L821 Other seborrheic keratosis: Secondary | ICD-10-CM | POA: Diagnosis not present

## 2023-06-01 DIAGNOSIS — B353 Tinea pedis: Secondary | ICD-10-CM | POA: Diagnosis not present

## 2023-06-02 DIAGNOSIS — B351 Tinea unguium: Secondary | ICD-10-CM | POA: Diagnosis not present

## 2023-06-02 DIAGNOSIS — N183 Chronic kidney disease, stage 3 unspecified: Secondary | ICD-10-CM | POA: Diagnosis not present

## 2023-06-02 DIAGNOSIS — M79674 Pain in right toe(s): Secondary | ICD-10-CM | POA: Diagnosis not present

## 2023-06-02 DIAGNOSIS — E1122 Type 2 diabetes mellitus with diabetic chronic kidney disease: Secondary | ICD-10-CM | POA: Diagnosis not present

## 2023-06-12 DIAGNOSIS — D696 Thrombocytopenia, unspecified: Secondary | ICD-10-CM | POA: Diagnosis not present

## 2023-06-12 DIAGNOSIS — D61818 Other pancytopenia: Secondary | ICD-10-CM | POA: Diagnosis not present

## 2023-06-12 DIAGNOSIS — I25119 Atherosclerotic heart disease of native coronary artery with unspecified angina pectoris: Secondary | ICD-10-CM | POA: Diagnosis not present

## 2023-06-12 DIAGNOSIS — I10 Essential (primary) hypertension: Secondary | ICD-10-CM | POA: Diagnosis not present

## 2023-06-12 DIAGNOSIS — N1831 Chronic kidney disease, stage 3a: Secondary | ICD-10-CM | POA: Diagnosis not present

## 2023-06-12 DIAGNOSIS — E785 Hyperlipidemia, unspecified: Secondary | ICD-10-CM | POA: Diagnosis not present

## 2023-06-12 DIAGNOSIS — I129 Hypertensive chronic kidney disease with stage 1 through stage 4 chronic kidney disease, or unspecified chronic kidney disease: Secondary | ICD-10-CM | POA: Diagnosis not present

## 2023-06-12 DIAGNOSIS — I7121 Aneurysm of the ascending aorta, without rupture: Secondary | ICD-10-CM | POA: Diagnosis not present

## 2023-06-12 DIAGNOSIS — E876 Hypokalemia: Secondary | ICD-10-CM | POA: Diagnosis not present

## 2023-06-12 DIAGNOSIS — F411 Generalized anxiety disorder: Secondary | ICD-10-CM | POA: Diagnosis not present

## 2023-06-12 DIAGNOSIS — G8929 Other chronic pain: Secondary | ICD-10-CM | POA: Diagnosis not present

## 2023-06-12 DIAGNOSIS — I209 Angina pectoris, unspecified: Secondary | ICD-10-CM | POA: Diagnosis not present

## 2023-07-25 DIAGNOSIS — H25813 Combined forms of age-related cataract, bilateral: Secondary | ICD-10-CM | POA: Diagnosis not present

## 2023-07-25 DIAGNOSIS — H25013 Cortical age-related cataract, bilateral: Secondary | ICD-10-CM | POA: Diagnosis not present

## 2023-07-25 DIAGNOSIS — H40013 Open angle with borderline findings, low risk, bilateral: Secondary | ICD-10-CM | POA: Diagnosis not present

## 2023-08-28 DIAGNOSIS — L2089 Other atopic dermatitis: Secondary | ICD-10-CM | POA: Diagnosis not present

## 2023-08-28 DIAGNOSIS — Z859 Personal history of malignant neoplasm, unspecified: Secondary | ICD-10-CM | POA: Diagnosis not present

## 2023-08-28 DIAGNOSIS — L304 Erythema intertrigo: Secondary | ICD-10-CM | POA: Diagnosis not present

## 2023-08-28 DIAGNOSIS — L578 Other skin changes due to chronic exposure to nonionizing radiation: Secondary | ICD-10-CM | POA: Diagnosis not present

## 2023-08-28 DIAGNOSIS — Z86018 Personal history of other benign neoplasm: Secondary | ICD-10-CM | POA: Diagnosis not present

## 2023-08-28 DIAGNOSIS — D485 Neoplasm of uncertain behavior of skin: Secondary | ICD-10-CM | POA: Diagnosis not present

## 2023-08-28 DIAGNOSIS — L814 Other melanin hyperpigmentation: Secondary | ICD-10-CM | POA: Diagnosis not present

## 2023-08-28 DIAGNOSIS — Z872 Personal history of diseases of the skin and subcutaneous tissue: Secondary | ICD-10-CM | POA: Diagnosis not present

## 2023-08-28 DIAGNOSIS — L400 Psoriasis vulgaris: Secondary | ICD-10-CM | POA: Diagnosis not present

## 2023-08-28 DIAGNOSIS — L570 Actinic keratosis: Secondary | ICD-10-CM | POA: Diagnosis not present

## 2023-09-19 DIAGNOSIS — L400 Psoriasis vulgaris: Secondary | ICD-10-CM | POA: Diagnosis not present

## 2023-10-06 DIAGNOSIS — I700 Atherosclerosis of aorta: Secondary | ICD-10-CM | POA: Diagnosis not present

## 2023-10-06 DIAGNOSIS — Z79899 Other long term (current) drug therapy: Secondary | ICD-10-CM | POA: Diagnosis not present

## 2023-10-06 DIAGNOSIS — R0982 Postnasal drip: Secondary | ICD-10-CM | POA: Diagnosis not present

## 2023-10-06 DIAGNOSIS — N183 Chronic kidney disease, stage 3 unspecified: Secondary | ICD-10-CM | POA: Diagnosis not present

## 2023-10-06 DIAGNOSIS — I152 Hypertension secondary to endocrine disorders: Secondary | ICD-10-CM | POA: Diagnosis not present

## 2023-10-06 DIAGNOSIS — E1159 Type 2 diabetes mellitus with other circulatory complications: Secondary | ICD-10-CM | POA: Diagnosis not present

## 2023-10-06 DIAGNOSIS — E785 Hyperlipidemia, unspecified: Secondary | ICD-10-CM | POA: Diagnosis not present

## 2023-10-06 DIAGNOSIS — E1122 Type 2 diabetes mellitus with diabetic chronic kidney disease: Secondary | ICD-10-CM | POA: Diagnosis not present

## 2023-10-06 DIAGNOSIS — T502X5A Adverse effect of carbonic-anhydrase inhibitors, benzothiadiazides and other diuretics, initial encounter: Secondary | ICD-10-CM | POA: Diagnosis not present

## 2023-10-06 DIAGNOSIS — I25119 Atherosclerotic heart disease of native coronary artery with unspecified angina pectoris: Secondary | ICD-10-CM | POA: Diagnosis not present

## 2023-10-06 DIAGNOSIS — N1831 Chronic kidney disease, stage 3a: Secondary | ICD-10-CM | POA: Diagnosis not present

## 2023-10-06 DIAGNOSIS — E538 Deficiency of other specified B group vitamins: Secondary | ICD-10-CM | POA: Diagnosis not present

## 2023-10-06 DIAGNOSIS — I252 Old myocardial infarction: Secondary | ICD-10-CM | POA: Diagnosis not present

## 2023-10-06 DIAGNOSIS — I2089 Other forms of angina pectoris: Secondary | ICD-10-CM | POA: Diagnosis not present

## 2023-10-06 DIAGNOSIS — E1169 Type 2 diabetes mellitus with other specified complication: Secondary | ICD-10-CM | POA: Diagnosis not present

## 2023-10-06 DIAGNOSIS — I7121 Aneurysm of the ascending aorta, without rupture: Secondary | ICD-10-CM | POA: Diagnosis not present

## 2023-10-06 DIAGNOSIS — Z0000 Encounter for general adult medical examination without abnormal findings: Secondary | ICD-10-CM | POA: Diagnosis not present

## 2023-10-06 DIAGNOSIS — Z1331 Encounter for screening for depression: Secondary | ICD-10-CM | POA: Diagnosis not present

## 2023-10-06 DIAGNOSIS — D696 Thrombocytopenia, unspecified: Secondary | ICD-10-CM | POA: Diagnosis not present

## 2023-10-06 DIAGNOSIS — E291 Testicular hypofunction: Secondary | ICD-10-CM | POA: Diagnosis not present

## 2023-10-06 DIAGNOSIS — E876 Hypokalemia: Secondary | ICD-10-CM | POA: Diagnosis not present

## 2023-11-17 DIAGNOSIS — E785 Hyperlipidemia, unspecified: Secondary | ICD-10-CM | POA: Diagnosis not present

## 2023-11-17 DIAGNOSIS — I152 Hypertension secondary to endocrine disorders: Secondary | ICD-10-CM | POA: Diagnosis not present

## 2023-11-17 DIAGNOSIS — I25119 Atherosclerotic heart disease of native coronary artery with unspecified angina pectoris: Secondary | ICD-10-CM | POA: Diagnosis not present

## 2023-11-17 DIAGNOSIS — E1159 Type 2 diabetes mellitus with other circulatory complications: Secondary | ICD-10-CM | POA: Diagnosis not present

## 2023-11-17 DIAGNOSIS — N1831 Chronic kidney disease, stage 3a: Secondary | ICD-10-CM | POA: Diagnosis not present

## 2023-11-17 DIAGNOSIS — N183 Chronic kidney disease, stage 3 unspecified: Secondary | ICD-10-CM | POA: Diagnosis not present

## 2023-11-17 DIAGNOSIS — E1122 Type 2 diabetes mellitus with diabetic chronic kidney disease: Secondary | ICD-10-CM | POA: Diagnosis not present

## 2023-11-17 DIAGNOSIS — I7121 Aneurysm of the ascending aorta, without rupture: Secondary | ICD-10-CM | POA: Diagnosis not present

## 2023-11-17 DIAGNOSIS — E1169 Type 2 diabetes mellitus with other specified complication: Secondary | ICD-10-CM | POA: Diagnosis not present

## 2023-11-17 DIAGNOSIS — I700 Atherosclerosis of aorta: Secondary | ICD-10-CM | POA: Diagnosis not present

## 2024-01-23 DIAGNOSIS — H401131 Primary open-angle glaucoma, bilateral, mild stage: Secondary | ICD-10-CM | POA: Diagnosis not present

## 2024-01-23 DIAGNOSIS — H25013 Cortical age-related cataract, bilateral: Secondary | ICD-10-CM | POA: Diagnosis not present

## 2024-01-23 DIAGNOSIS — H25813 Combined forms of age-related cataract, bilateral: Secondary | ICD-10-CM | POA: Diagnosis not present

## 2024-03-05 DIAGNOSIS — H401131 Primary open-angle glaucoma, bilateral, mild stage: Secondary | ICD-10-CM | POA: Diagnosis not present

## 2024-03-05 DIAGNOSIS — H25013 Cortical age-related cataract, bilateral: Secondary | ICD-10-CM | POA: Diagnosis not present

## 2024-03-05 DIAGNOSIS — H25813 Combined forms of age-related cataract, bilateral: Secondary | ICD-10-CM | POA: Diagnosis not present

## 2024-04-18 DIAGNOSIS — I152 Hypertension secondary to endocrine disorders: Secondary | ICD-10-CM | POA: Diagnosis not present

## 2024-04-18 DIAGNOSIS — E538 Deficiency of other specified B group vitamins: Secondary | ICD-10-CM | POA: Diagnosis not present

## 2024-04-18 DIAGNOSIS — N401 Enlarged prostate with lower urinary tract symptoms: Secondary | ICD-10-CM | POA: Diagnosis not present

## 2024-04-18 DIAGNOSIS — Z1331 Encounter for screening for depression: Secondary | ICD-10-CM | POA: Diagnosis not present

## 2024-04-18 DIAGNOSIS — K219 Gastro-esophageal reflux disease without esophagitis: Secondary | ICD-10-CM | POA: Diagnosis not present

## 2024-04-18 DIAGNOSIS — Z09 Encounter for follow-up examination after completed treatment for conditions other than malignant neoplasm: Secondary | ICD-10-CM | POA: Diagnosis not present

## 2024-04-18 DIAGNOSIS — E1122 Type 2 diabetes mellitus with diabetic chronic kidney disease: Secondary | ICD-10-CM | POA: Diagnosis not present

## 2024-04-18 DIAGNOSIS — N1831 Chronic kidney disease, stage 3a: Secondary | ICD-10-CM | POA: Diagnosis not present

## 2024-04-18 DIAGNOSIS — N183 Chronic kidney disease, stage 3 unspecified: Secondary | ICD-10-CM | POA: Diagnosis not present

## 2024-04-18 DIAGNOSIS — I25119 Atherosclerotic heart disease of native coronary artery with unspecified angina pectoris: Secondary | ICD-10-CM | POA: Diagnosis not present

## 2024-04-18 DIAGNOSIS — E1159 Type 2 diabetes mellitus with other circulatory complications: Secondary | ICD-10-CM | POA: Diagnosis not present

## 2024-04-18 DIAGNOSIS — E1169 Type 2 diabetes mellitus with other specified complication: Secondary | ICD-10-CM | POA: Diagnosis not present

## 2024-04-18 DIAGNOSIS — T502X5A Adverse effect of carbonic-anhydrase inhibitors, benzothiadiazides and other diuretics, initial encounter: Secondary | ICD-10-CM | POA: Diagnosis not present

## 2024-04-18 DIAGNOSIS — E876 Hypokalemia: Secondary | ICD-10-CM | POA: Diagnosis not present

## 2024-04-18 DIAGNOSIS — F418 Other specified anxiety disorders: Secondary | ICD-10-CM | POA: Diagnosis not present

## 2024-06-11 DIAGNOSIS — Z1331 Encounter for screening for depression: Secondary | ICD-10-CM | POA: Diagnosis not present

## 2024-06-11 DIAGNOSIS — B029 Zoster without complications: Secondary | ICD-10-CM | POA: Diagnosis not present

## 2024-06-11 DIAGNOSIS — I2089 Other forms of angina pectoris: Secondary | ICD-10-CM | POA: Diagnosis not present

## 2024-06-21 DIAGNOSIS — E1159 Type 2 diabetes mellitus with other circulatory complications: Secondary | ICD-10-CM | POA: Diagnosis not present

## 2024-06-21 DIAGNOSIS — E785 Hyperlipidemia, unspecified: Secondary | ICD-10-CM | POA: Diagnosis not present

## 2024-06-21 DIAGNOSIS — N183 Chronic kidney disease, stage 3 unspecified: Secondary | ICD-10-CM | POA: Diagnosis not present

## 2024-06-21 DIAGNOSIS — F418 Other specified anxiety disorders: Secondary | ICD-10-CM | POA: Diagnosis not present

## 2024-06-21 DIAGNOSIS — E1122 Type 2 diabetes mellitus with diabetic chronic kidney disease: Secondary | ICD-10-CM | POA: Diagnosis not present

## 2024-06-21 DIAGNOSIS — I152 Hypertension secondary to endocrine disorders: Secondary | ICD-10-CM | POA: Diagnosis not present

## 2024-06-21 DIAGNOSIS — E1169 Type 2 diabetes mellitus with other specified complication: Secondary | ICD-10-CM | POA: Diagnosis not present

## 2024-08-27 DIAGNOSIS — Z859 Personal history of malignant neoplasm, unspecified: Secondary | ICD-10-CM | POA: Diagnosis not present

## 2024-08-27 DIAGNOSIS — L304 Erythema intertrigo: Secondary | ICD-10-CM | POA: Diagnosis not present

## 2024-08-27 DIAGNOSIS — L400 Psoriasis vulgaris: Secondary | ICD-10-CM | POA: Diagnosis not present

## 2024-08-27 DIAGNOSIS — L570 Actinic keratosis: Secondary | ICD-10-CM | POA: Diagnosis not present

## 2024-08-27 DIAGNOSIS — L2089 Other atopic dermatitis: Secondary | ICD-10-CM | POA: Diagnosis not present

## 2024-08-27 DIAGNOSIS — Z872 Personal history of diseases of the skin and subcutaneous tissue: Secondary | ICD-10-CM | POA: Diagnosis not present

## 2024-08-27 DIAGNOSIS — Z86018 Personal history of other benign neoplasm: Secondary | ICD-10-CM | POA: Diagnosis not present

## 2024-08-27 DIAGNOSIS — L578 Other skin changes due to chronic exposure to nonionizing radiation: Secondary | ICD-10-CM | POA: Diagnosis not present

## 2024-08-29 DIAGNOSIS — M79675 Pain in left toe(s): Secondary | ICD-10-CM | POA: Diagnosis not present

## 2024-08-29 DIAGNOSIS — M79674 Pain in right toe(s): Secondary | ICD-10-CM | POA: Diagnosis not present

## 2024-08-29 DIAGNOSIS — N183 Chronic kidney disease, stage 3 unspecified: Secondary | ICD-10-CM | POA: Diagnosis not present

## 2024-08-29 DIAGNOSIS — E1122 Type 2 diabetes mellitus with diabetic chronic kidney disease: Secondary | ICD-10-CM | POA: Diagnosis not present

## 2024-08-29 DIAGNOSIS — B351 Tinea unguium: Secondary | ICD-10-CM | POA: Diagnosis not present

## 2024-08-29 DIAGNOSIS — L600 Ingrowing nail: Secondary | ICD-10-CM | POA: Diagnosis not present

## 2024-09-10 DIAGNOSIS — H401131 Primary open-angle glaucoma, bilateral, mild stage: Secondary | ICD-10-CM | POA: Diagnosis not present

## 2024-09-10 DIAGNOSIS — H25813 Combined forms of age-related cataract, bilateral: Secondary | ICD-10-CM | POA: Diagnosis not present

## 2024-09-10 DIAGNOSIS — H25013 Cortical age-related cataract, bilateral: Secondary | ICD-10-CM | POA: Diagnosis not present

## 2024-10-22 DIAGNOSIS — E119 Type 2 diabetes mellitus without complications: Secondary | ICD-10-CM | POA: Diagnosis not present

## 2024-10-22 DIAGNOSIS — N183 Chronic kidney disease, stage 3 unspecified: Secondary | ICD-10-CM | POA: Diagnosis not present

## 2024-10-22 DIAGNOSIS — I10 Essential (primary) hypertension: Secondary | ICD-10-CM | POA: Diagnosis not present

## 2024-10-22 DIAGNOSIS — E876 Hypokalemia: Secondary | ICD-10-CM | POA: Diagnosis not present

## 2024-10-22 DIAGNOSIS — K5909 Other constipation: Secondary | ICD-10-CM | POA: Diagnosis not present

## 2024-10-22 DIAGNOSIS — N1831 Chronic kidney disease, stage 3a: Secondary | ICD-10-CM | POA: Diagnosis not present

## 2024-10-22 DIAGNOSIS — E785 Hyperlipidemia, unspecified: Secondary | ICD-10-CM | POA: Diagnosis not present

## 2024-10-22 DIAGNOSIS — E538 Deficiency of other specified B group vitamins: Secondary | ICD-10-CM | POA: Diagnosis not present

## 2024-10-22 DIAGNOSIS — I152 Hypertension secondary to endocrine disorders: Secondary | ICD-10-CM | POA: Diagnosis not present

## 2024-10-22 DIAGNOSIS — E1122 Type 2 diabetes mellitus with diabetic chronic kidney disease: Secondary | ICD-10-CM | POA: Diagnosis not present

## 2024-10-22 DIAGNOSIS — I252 Old myocardial infarction: Secondary | ICD-10-CM | POA: Diagnosis not present

## 2024-10-22 DIAGNOSIS — I25119 Atherosclerotic heart disease of native coronary artery with unspecified angina pectoris: Secondary | ICD-10-CM | POA: Diagnosis not present

## 2024-10-22 DIAGNOSIS — T502X5A Adverse effect of carbonic-anhydrase inhibitors, benzothiadiazides and other diuretics, initial encounter: Secondary | ICD-10-CM | POA: Diagnosis not present

## 2024-10-22 DIAGNOSIS — D696 Thrombocytopenia, unspecified: Secondary | ICD-10-CM | POA: Diagnosis not present

## 2024-10-22 DIAGNOSIS — E1169 Type 2 diabetes mellitus with other specified complication: Secondary | ICD-10-CM | POA: Diagnosis not present

## 2024-10-22 DIAGNOSIS — E1159 Type 2 diabetes mellitus with other circulatory complications: Secondary | ICD-10-CM | POA: Diagnosis not present

## 2024-10-31 DIAGNOSIS — K219 Gastro-esophageal reflux disease without esophagitis: Secondary | ICD-10-CM | POA: Diagnosis not present

## 2024-10-31 DIAGNOSIS — E1169 Type 2 diabetes mellitus with other specified complication: Secondary | ICD-10-CM | POA: Diagnosis not present

## 2024-10-31 DIAGNOSIS — N1831 Chronic kidney disease, stage 3a: Secondary | ICD-10-CM | POA: Diagnosis not present

## 2024-10-31 DIAGNOSIS — E785 Hyperlipidemia, unspecified: Secondary | ICD-10-CM | POA: Diagnosis not present

## 2024-10-31 DIAGNOSIS — I252 Old myocardial infarction: Secondary | ICD-10-CM | POA: Diagnosis not present

## 2024-10-31 DIAGNOSIS — E1159 Type 2 diabetes mellitus with other circulatory complications: Secondary | ICD-10-CM | POA: Diagnosis not present

## 2024-10-31 DIAGNOSIS — N183 Chronic kidney disease, stage 3 unspecified: Secondary | ICD-10-CM | POA: Diagnosis not present

## 2024-10-31 DIAGNOSIS — I152 Hypertension secondary to endocrine disorders: Secondary | ICD-10-CM | POA: Diagnosis not present

## 2024-10-31 DIAGNOSIS — E1122 Type 2 diabetes mellitus with diabetic chronic kidney disease: Secondary | ICD-10-CM | POA: Diagnosis not present

## 2024-10-31 DIAGNOSIS — I25119 Atherosclerotic heart disease of native coronary artery with unspecified angina pectoris: Secondary | ICD-10-CM | POA: Diagnosis not present
